# Patient Record
Sex: Female | Born: 1945 | Race: White | Hispanic: No | Smoking: Former smoker
Health system: Southern US, Community
[De-identification: ages and names within clinical notes are randomized; demographics above are authoritative.]

## PROBLEM LIST (undated history)

## (undated) DIAGNOSIS — Z860100 Personal history of colon polyps, unspecified: Secondary | ICD-10-CM

## (undated) DIAGNOSIS — I6529 Occlusion and stenosis of unspecified carotid artery: Secondary | ICD-10-CM

## (undated) DIAGNOSIS — L719 Rosacea, unspecified: Secondary | ICD-10-CM

## (undated) DIAGNOSIS — R51 Headache: Secondary | ICD-10-CM

## (undated) DIAGNOSIS — E785 Hyperlipidemia, unspecified: Secondary | ICD-10-CM

## (undated) DIAGNOSIS — E039 Hypothyroidism, unspecified: Secondary | ICD-10-CM

## (undated) DIAGNOSIS — F329 Major depressive disorder, single episode, unspecified: Secondary | ICD-10-CM

## (undated) DIAGNOSIS — R112 Nausea with vomiting, unspecified: Secondary | ICD-10-CM

## (undated) DIAGNOSIS — I10 Essential (primary) hypertension: Secondary | ICD-10-CM

## (undated) DIAGNOSIS — I679 Cerebrovascular disease, unspecified: Secondary | ICD-10-CM

## (undated) DIAGNOSIS — S82892A Other fracture of left lower leg, initial encounter for closed fracture: Secondary | ICD-10-CM

## (undated) DIAGNOSIS — G459 Transient cerebral ischemic attack, unspecified: Secondary | ICD-10-CM

## (undated) DIAGNOSIS — K219 Gastro-esophageal reflux disease without esophagitis: Secondary | ICD-10-CM

## (undated) DIAGNOSIS — D510 Vitamin B12 deficiency anemia due to intrinsic factor deficiency: Secondary | ICD-10-CM

## (undated) DIAGNOSIS — G8929 Other chronic pain: Secondary | ICD-10-CM

## (undated) DIAGNOSIS — M1990 Unspecified osteoarthritis, unspecified site: Secondary | ICD-10-CM

## (undated) DIAGNOSIS — M109 Gout, unspecified: Secondary | ICD-10-CM

## (undated) DIAGNOSIS — T8859XA Other complications of anesthesia, initial encounter: Secondary | ICD-10-CM

## (undated) DIAGNOSIS — Z9889 Other specified postprocedural states: Secondary | ICD-10-CM

## (undated) DIAGNOSIS — E119 Type 2 diabetes mellitus without complications: Secondary | ICD-10-CM

## (undated) DIAGNOSIS — M25511 Pain in right shoulder: Secondary | ICD-10-CM

## (undated) DIAGNOSIS — M545 Low back pain: Secondary | ICD-10-CM

## (undated) DIAGNOSIS — L600 Ingrowing nail: Secondary | ICD-10-CM

## (undated) DIAGNOSIS — Z8719 Personal history of other diseases of the digestive system: Secondary | ICD-10-CM

## (undated) DIAGNOSIS — F419 Anxiety disorder, unspecified: Secondary | ICD-10-CM

## (undated) DIAGNOSIS — I350 Nonrheumatic aortic (valve) stenosis: Secondary | ICD-10-CM

## (undated) DIAGNOSIS — K5732 Diverticulitis of large intestine without perforation or abscess without bleeding: Secondary | ICD-10-CM

## (undated) DIAGNOSIS — T4145XA Adverse effect of unspecified anesthetic, initial encounter: Secondary | ICD-10-CM

## (undated) DIAGNOSIS — R519 Headache, unspecified: Secondary | ICD-10-CM

## (undated) DIAGNOSIS — L03032 Cellulitis of left toe: Secondary | ICD-10-CM

## (undated) DIAGNOSIS — I6523 Occlusion and stenosis of bilateral carotid arteries: Secondary | ICD-10-CM

## (undated) DIAGNOSIS — G2581 Restless legs syndrome: Secondary | ICD-10-CM

## (undated) DIAGNOSIS — G609 Hereditary and idiopathic neuropathy, unspecified: Secondary | ICD-10-CM

## (undated) DIAGNOSIS — R002 Palpitations: Secondary | ICD-10-CM

## (undated) DIAGNOSIS — L03031 Cellulitis of right toe: Secondary | ICD-10-CM

## (undated) HISTORY — DX: Diverticulitis of large intestine without perforation or abscess without bleeding: K57.32

## (undated) HISTORY — DX: Palpitations: R00.2

## (undated) HISTORY — DX: Hereditary and idiopathic neuropathy, unspecified: G60.9

## (undated) HISTORY — DX: Unspecified osteoarthritis, unspecified site: M19.90

## (undated) HISTORY — DX: Gout, unspecified: M10.9

## (undated) HISTORY — DX: Personal history of colonic polyps: Z86.010

## (undated) HISTORY — DX: Low back pain: M54.5

## (undated) HISTORY — DX: Type 2 diabetes mellitus without complications: E11.9

## (undated) HISTORY — DX: Pain in right shoulder: M25.511

## (undated) HISTORY — DX: Major depressive disorder, single episode, unspecified: F32.9

## (undated) HISTORY — DX: Other chronic pain: G89.29

## (undated) HISTORY — DX: Other specified postprocedural states: Z98.890

## (undated) HISTORY — DX: Anxiety disorder, unspecified: F41.9

## (undated) HISTORY — DX: Essential (primary) hypertension: I10

## (undated) HISTORY — DX: Vitamin B12 deficiency anemia due to intrinsic factor deficiency: D51.0

## (undated) HISTORY — DX: Personal history of colon polyps, unspecified: Z86.0100

## (undated) HISTORY — DX: Occlusion and stenosis of bilateral carotid arteries: I65.23

## (undated) HISTORY — DX: Rosacea, unspecified: L71.9

## (undated) HISTORY — DX: Occlusion and stenosis of unspecified carotid artery: I65.29

## (undated) HISTORY — DX: Cellulitis of right toe: L03.031

## (undated) HISTORY — DX: Other fracture of left lower leg, initial encounter for closed fracture: S82.892A

## (undated) HISTORY — DX: Restless legs syndrome: G25.81

## (undated) HISTORY — PX: TOOTH EXTRACTION: SUR596

## (undated) HISTORY — DX: Nonrheumatic aortic (valve) stenosis: I35.0

## (undated) HISTORY — DX: Cerebrovascular disease, unspecified: I67.9

## (undated) HISTORY — PX: ANKLE SURGERY: SHX546

## (undated) HISTORY — DX: Hyperlipidemia, unspecified: E78.5

## (undated) HISTORY — DX: Hypothyroidism, unspecified: E03.9

## (undated) HISTORY — DX: Cellulitis of left toe: L03.032

## (undated) HISTORY — DX: Ingrowing nail: L60.0

## (undated) HISTORY — PX: APPENDECTOMY: SHX54

## (undated) HISTORY — PX: ABDOMINAL HYSTERECTOMY: SHX81

---

## 1973-07-16 HISTORY — PX: NASAL SINUS SURGERY: SHX719

## 1983-07-17 HISTORY — PX: POLYPECTOMY: SHX149

## 1989-07-16 HISTORY — PX: CHOLECYSTECTOMY: SHX55

## 2008-05-31 ENCOUNTER — Ambulatory Visit: Payer: Self-pay | Admitting: Cardiology

## 2010-11-28 NOTE — Assessment & Plan Note (Signed)
[facility]                            CARDIOLOGY OFFICE NOTE   Meredith Young                     MRN:          [id number]  DATE:05/31/2008                            DOB:          1946/06/12    PRIMARY CARE PHYSICIAN:  Meredith Fusi, MD.   REASON FOR REFERRAL:  Evaluate the patient with chest pain.   HISTORY OF PRESENT ILLNESS:  The patient is a pleasant 65 year old white  female.  I recently saw her mother as a new patient.  This patient has a  history of catheterization 2 years ago in [facility] for chest  discomfort.  She has told she had normal coronaries.  Two weeks ago she  started having increased burning chest discomfort with walking.  She has  had this same kind of pain in the past.  It had actually been going off  and on for a while, but seemed to increase in severity.  It would be a  substernal discomfort.  It might last for 15 minutes.  She described it  as severe.  She had some discomfort in both arms with this.  She was not  describing any shortness of breath or diaphoresis.  She does not have  any resting complaints.  She was not describing any PND or orthopnea.  She was not having any palpitations, presyncope, or syncope.  She went  to [facility].  She did have a Cardiolite demonstrating no  evidence of ischemia and a preserved ejection fraction.   The patient continues to get some of these symptoms with exertion though  perhaps not as severe.  She is referred here for further evaluation.   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   PAST SURGICAL HISTORY:  Hysterectomy and cholecystectomy.   ALLERGIES:  Intolerance to DEMEROL and CODEINE.   MEDICATIONS:  1. Paroxetine.  2. Glipizide 5 mg  q.a.m. and 5 mg q.p.m.  3. Insulin.  4. Onglyza 5 mg daily.  5. Lovaza 2000 mg b.i.d.  6. Folic acid.  7. Simvastatin 80 mg daily.  8. Coenzyme Q.  9. Gabapentin.  10.Minocycline.  11.Lisinopril 40 mg daily.  12.Amlodipine 5 mg daily.  13.Nexium p.r.n.   SOCIAL HISTORY:  The patient is retired.  She is a widow.  She has 2  children.  She smoked 4 packs per day for 22 years but quit in 1990.   FAMILY HISTORY:  Contributory for father dying of CVA at 41.  He had  hypertension.  Mother has a history of hypertension.  There is a  questionable myocardial infarction though I do not have documentation of  this.   REVIEW OF SYSTEMS:  As stated in the HPI and positive for headaches,  reflux, negative for all other systems.   PHYSICAL EXAMINATION:  GENERAL:  The patient is in no distress.  VITAL SIGNS:  Blood pressure 136/77, heart rate 90 and regular, body  mass index 32, and  weight 174 pounds.  HEENT:  Eyes are unremarkable, pupils equal, round, and reactive to  light.  Fundi not visualized.  Oral mucosa unremarkable.  NECK:  No jugular venous distention at 45 degrees, carotid upstroke  brisk and symmetrical.  No bruits, no thyromegaly.  LYMPHATICS:  No cervical, axillary, or inguinal adenopathy.  LUNGS:  Clear to auscultation bilaterally.  BACK:  No costovertebral angle tenderness.  CHEST:  Unremarkable.  HEART:  PMI not displaced or sustained, S1 and S2 within normal limits.  No S3, no S4, no clicks, no rubs, and no murmurs.  ABDOMEN:  Mildly  obese, positive bowel sounds, normal in frequency and pitch.  No bruits,  no rebound, no guarding, no midline pulsatile mass, no hepatomegaly, and  no splenomegaly.  SKIN:  No rashes, no nodules.  EXTREMITIES:  2+ pulses throughout, no edema, no cyanosis, and no  clubbing.  NEUROLOGIC:  Oriented to person, place, and time, cranial nerves II  through XII are grossly intact, motor grossly intact.   EKG sinus rhythm, rate 90, axis within  the rightward, intervals within  normal limits, poor anterior R-wave progression, no acute ST-T wave  changes.   ASSESSMENT AND PLAN:  1. Chest discomfort.  The patient has chest discomfort that was      worrisome.  However, she was seen at [facility] and had an      evaluation to include a stress perfusion study, which was negative      for any evidence of ischemia.  I will try to review these pictures.      Also, I will try get the results of the catheterization done a      couple of years ago at [place].  If indeed her catheterization was      normal then the possibility that this is a false negative stress      perfusion study is very low and no further testing would be      warranted.  She could be evaluated for nonanginal etiologies of her      chest discomfort.  She should continue with aggressive primary risk      reduction.  2. Peripheral vascular disease.  The patient can have carotids      followed by Dr. Tomasa Blase.  This should prompt aggressive lipid      lowering.  3.  Dyslipidemia per Dr. Tomasa Blase with the goal LDL less      than 70 and HDL greater than 50.  3. Diabetes per Dr. Tomasa Blase.  4. Hypertension.  Blood pressure is controlled on the meds as listed      and she will continue with this.  5. Followup.  I would like to follow her back if she has any      progressive symptoms or change in her symptoms.  I will be trying      to review these Cardiolite pictures, though I am not sure I can      open this disk.  I will also try to get the catheterization report      from [facility].     Rollene Rotunda, MD, [facility]  Electronically Signed    JH/MedQ  DD: 06/02/2008  DT: 06/03/2008  Job #: [id number]   cc:   Meredith Fusi, MD

## 2011-07-31 DIAGNOSIS — M545 Low back pain: Secondary | ICD-10-CM | POA: Diagnosis not present

## 2011-07-31 DIAGNOSIS — K5732 Diverticulitis of large intestine without perforation or abscess without bleeding: Secondary | ICD-10-CM | POA: Diagnosis not present

## 2011-08-31 DIAGNOSIS — E785 Hyperlipidemia, unspecified: Secondary | ICD-10-CM | POA: Diagnosis not present

## 2011-08-31 DIAGNOSIS — M109 Gout, unspecified: Secondary | ICD-10-CM | POA: Diagnosis not present

## 2011-08-31 DIAGNOSIS — Z6831 Body mass index (BMI) 31.0-31.9, adult: Secondary | ICD-10-CM | POA: Diagnosis not present

## 2011-08-31 DIAGNOSIS — I10 Essential (primary) hypertension: Secondary | ICD-10-CM | POA: Diagnosis not present

## 2011-08-31 DIAGNOSIS — J019 Acute sinusitis, unspecified: Secondary | ICD-10-CM | POA: Diagnosis not present

## 2011-08-31 DIAGNOSIS — E039 Hypothyroidism, unspecified: Secondary | ICD-10-CM | POA: Diagnosis not present

## 2011-08-31 DIAGNOSIS — J209 Acute bronchitis, unspecified: Secondary | ICD-10-CM | POA: Diagnosis not present

## 2011-08-31 DIAGNOSIS — D510 Vitamin B12 deficiency anemia due to intrinsic factor deficiency: Secondary | ICD-10-CM | POA: Diagnosis not present

## 2011-09-06 DIAGNOSIS — M109 Gout, unspecified: Secondary | ICD-10-CM | POA: Diagnosis not present

## 2011-09-28 DIAGNOSIS — Z6831 Body mass index (BMI) 31.0-31.9, adult: Secondary | ICD-10-CM | POA: Diagnosis not present

## 2011-09-28 DIAGNOSIS — F411 Generalized anxiety disorder: Secondary | ICD-10-CM | POA: Diagnosis not present

## 2011-09-30 DIAGNOSIS — N390 Urinary tract infection, site not specified: Secondary | ICD-10-CM | POA: Diagnosis not present

## 2011-09-30 DIAGNOSIS — R5381 Other malaise: Secondary | ICD-10-CM | POA: Diagnosis not present

## 2011-09-30 DIAGNOSIS — R5383 Other fatigue: Secondary | ICD-10-CM | POA: Diagnosis not present

## 2011-09-30 DIAGNOSIS — R509 Fever, unspecified: Secondary | ICD-10-CM | POA: Diagnosis not present

## 2011-09-30 DIAGNOSIS — R1110 Vomiting, unspecified: Secondary | ICD-10-CM | POA: Diagnosis not present

## 2011-10-05 DIAGNOSIS — N390 Urinary tract infection, site not specified: Secondary | ICD-10-CM | POA: Diagnosis not present

## 2011-10-05 DIAGNOSIS — Z4801 Encounter for change or removal of surgical wound dressing: Secondary | ICD-10-CM | POA: Diagnosis not present

## 2011-12-04 DIAGNOSIS — M109 Gout, unspecified: Secondary | ICD-10-CM | POA: Diagnosis not present

## 2011-12-04 DIAGNOSIS — IMO0001 Reserved for inherently not codable concepts without codable children: Secondary | ICD-10-CM | POA: Diagnosis not present

## 2011-12-04 DIAGNOSIS — N390 Urinary tract infection, site not specified: Secondary | ICD-10-CM | POA: Diagnosis not present

## 2011-12-04 DIAGNOSIS — E039 Hypothyroidism, unspecified: Secondary | ICD-10-CM | POA: Diagnosis not present

## 2011-12-04 DIAGNOSIS — D510 Vitamin B12 deficiency anemia due to intrinsic factor deficiency: Secondary | ICD-10-CM | POA: Diagnosis not present

## 2011-12-04 DIAGNOSIS — E785 Hyperlipidemia, unspecified: Secondary | ICD-10-CM | POA: Diagnosis not present

## 2011-12-04 DIAGNOSIS — I10 Essential (primary) hypertension: Secondary | ICD-10-CM | POA: Diagnosis not present

## 2011-12-04 DIAGNOSIS — Z6831 Body mass index (BMI) 31.0-31.9, adult: Secondary | ICD-10-CM | POA: Diagnosis not present

## 2011-12-21 DIAGNOSIS — H01009 Unspecified blepharitis unspecified eye, unspecified eyelid: Secondary | ICD-10-CM | POA: Diagnosis not present

## 2011-12-21 DIAGNOSIS — B309 Viral conjunctivitis, unspecified: Secondary | ICD-10-CM | POA: Diagnosis not present

## 2011-12-31 DIAGNOSIS — Z1231 Encounter for screening mammogram for malignant neoplasm of breast: Secondary | ICD-10-CM | POA: Diagnosis not present

## 2012-01-01 DIAGNOSIS — H01009 Unspecified blepharitis unspecified eye, unspecified eyelid: Secondary | ICD-10-CM | POA: Diagnosis not present

## 2012-01-01 DIAGNOSIS — B309 Viral conjunctivitis, unspecified: Secondary | ICD-10-CM | POA: Diagnosis not present

## 2012-01-18 DIAGNOSIS — J019 Acute sinusitis, unspecified: Secondary | ICD-10-CM | POA: Diagnosis not present

## 2012-01-18 DIAGNOSIS — M545 Low back pain: Secondary | ICD-10-CM | POA: Diagnosis not present

## 2012-01-18 DIAGNOSIS — H6690 Otitis media, unspecified, unspecified ear: Secondary | ICD-10-CM | POA: Diagnosis not present

## 2012-01-18 DIAGNOSIS — H6120 Impacted cerumen, unspecified ear: Secondary | ICD-10-CM | POA: Diagnosis not present

## 2012-01-18 DIAGNOSIS — R350 Frequency of micturition: Secondary | ICD-10-CM | POA: Diagnosis not present

## 2012-01-18 DIAGNOSIS — Z6832 Body mass index (BMI) 32.0-32.9, adult: Secondary | ICD-10-CM | POA: Diagnosis not present

## 2012-02-08 DIAGNOSIS — J209 Acute bronchitis, unspecified: Secondary | ICD-10-CM | POA: Diagnosis not present

## 2012-02-08 DIAGNOSIS — Z6831 Body mass index (BMI) 31.0-31.9, adult: Secondary | ICD-10-CM | POA: Diagnosis not present

## 2012-03-10 DIAGNOSIS — E785 Hyperlipidemia, unspecified: Secondary | ICD-10-CM | POA: Diagnosis not present

## 2012-03-10 DIAGNOSIS — IMO0001 Reserved for inherently not codable concepts without codable children: Secondary | ICD-10-CM | POA: Diagnosis not present

## 2012-03-10 DIAGNOSIS — Z6831 Body mass index (BMI) 31.0-31.9, adult: Secondary | ICD-10-CM | POA: Diagnosis not present

## 2012-03-10 DIAGNOSIS — I10 Essential (primary) hypertension: Secondary | ICD-10-CM | POA: Diagnosis not present

## 2012-03-10 DIAGNOSIS — D510 Vitamin B12 deficiency anemia due to intrinsic factor deficiency: Secondary | ICD-10-CM | POA: Diagnosis not present

## 2012-03-10 DIAGNOSIS — M109 Gout, unspecified: Secondary | ICD-10-CM | POA: Diagnosis not present

## 2012-03-10 DIAGNOSIS — R809 Proteinuria, unspecified: Secondary | ICD-10-CM | POA: Diagnosis not present

## 2012-03-10 DIAGNOSIS — E039 Hypothyroidism, unspecified: Secondary | ICD-10-CM | POA: Diagnosis not present

## 2012-03-13 DIAGNOSIS — R079 Chest pain, unspecified: Secondary | ICD-10-CM | POA: Diagnosis not present

## 2012-03-13 DIAGNOSIS — R05 Cough: Secondary | ICD-10-CM | POA: Diagnosis not present

## 2012-03-13 DIAGNOSIS — M47817 Spondylosis without myelopathy or radiculopathy, lumbosacral region: Secondary | ICD-10-CM | POA: Diagnosis not present

## 2012-03-13 DIAGNOSIS — R059 Cough, unspecified: Secondary | ICD-10-CM | POA: Diagnosis not present

## 2012-03-13 DIAGNOSIS — M545 Low back pain, unspecified: Secondary | ICD-10-CM | POA: Diagnosis not present

## 2012-05-07 DIAGNOSIS — N390 Urinary tract infection, site not specified: Secondary | ICD-10-CM | POA: Diagnosis not present

## 2012-05-07 DIAGNOSIS — Z23 Encounter for immunization: Secondary | ICD-10-CM | POA: Diagnosis not present

## 2012-06-11 DIAGNOSIS — D510 Vitamin B12 deficiency anemia due to intrinsic factor deficiency: Secondary | ICD-10-CM | POA: Diagnosis not present

## 2012-06-11 DIAGNOSIS — M109 Gout, unspecified: Secondary | ICD-10-CM | POA: Diagnosis not present

## 2012-06-11 DIAGNOSIS — Z6831 Body mass index (BMI) 31.0-31.9, adult: Secondary | ICD-10-CM | POA: Diagnosis not present

## 2012-06-11 DIAGNOSIS — E785 Hyperlipidemia, unspecified: Secondary | ICD-10-CM | POA: Diagnosis not present

## 2012-06-11 DIAGNOSIS — E039 Hypothyroidism, unspecified: Secondary | ICD-10-CM | POA: Diagnosis not present

## 2012-06-11 DIAGNOSIS — IMO0001 Reserved for inherently not codable concepts without codable children: Secondary | ICD-10-CM | POA: Diagnosis not present

## 2012-06-11 DIAGNOSIS — I10 Essential (primary) hypertension: Secondary | ICD-10-CM | POA: Diagnosis not present

## 2012-07-03 DIAGNOSIS — Z6831 Body mass index (BMI) 31.0-31.9, adult: Secondary | ICD-10-CM | POA: Diagnosis not present

## 2012-07-25 DIAGNOSIS — N3000 Acute cystitis without hematuria: Secondary | ICD-10-CM | POA: Diagnosis not present

## 2012-07-29 DIAGNOSIS — J019 Acute sinusitis, unspecified: Secondary | ICD-10-CM | POA: Diagnosis not present

## 2012-07-30 DIAGNOSIS — E119 Type 2 diabetes mellitus without complications: Secondary | ICD-10-CM | POA: Diagnosis not present

## 2012-07-30 DIAGNOSIS — N390 Urinary tract infection, site not specified: Secondary | ICD-10-CM | POA: Diagnosis not present

## 2012-07-30 DIAGNOSIS — R3915 Urgency of urination: Secondary | ICD-10-CM | POA: Diagnosis not present

## 2012-07-30 DIAGNOSIS — N3941 Urge incontinence: Secondary | ICD-10-CM | POA: Diagnosis not present

## 2012-07-30 DIAGNOSIS — N35919 Unspecified urethral stricture, male, unspecified site: Secondary | ICD-10-CM | POA: Diagnosis not present

## 2012-08-18 DIAGNOSIS — Z6832 Body mass index (BMI) 32.0-32.9, adult: Secondary | ICD-10-CM | POA: Diagnosis not present

## 2012-08-18 DIAGNOSIS — N390 Urinary tract infection, site not specified: Secondary | ICD-10-CM | POA: Diagnosis not present

## 2012-09-03 DIAGNOSIS — R3915 Urgency of urination: Secondary | ICD-10-CM | POA: Diagnosis not present

## 2012-09-03 DIAGNOSIS — N390 Urinary tract infection, site not specified: Secondary | ICD-10-CM | POA: Diagnosis not present

## 2012-09-03 DIAGNOSIS — N3941 Urge incontinence: Secondary | ICD-10-CM | POA: Diagnosis not present

## 2012-09-03 DIAGNOSIS — N289 Disorder of kidney and ureter, unspecified: Secondary | ICD-10-CM | POA: Diagnosis not present

## 2012-09-03 DIAGNOSIS — K5730 Diverticulosis of large intestine without perforation or abscess without bleeding: Secondary | ICD-10-CM | POA: Diagnosis not present

## 2012-09-03 DIAGNOSIS — K7689 Other specified diseases of liver: Secondary | ICD-10-CM | POA: Diagnosis not present

## 2012-10-20 DIAGNOSIS — J019 Acute sinusitis, unspecified: Secondary | ICD-10-CM | POA: Diagnosis not present

## 2012-11-04 DIAGNOSIS — E039 Hypothyroidism, unspecified: Secondary | ICD-10-CM | POA: Diagnosis not present

## 2012-11-04 DIAGNOSIS — M109 Gout, unspecified: Secondary | ICD-10-CM | POA: Diagnosis not present

## 2012-11-04 DIAGNOSIS — Z1331 Encounter for screening for depression: Secondary | ICD-10-CM | POA: Diagnosis not present

## 2012-11-04 DIAGNOSIS — E785 Hyperlipidemia, unspecified: Secondary | ICD-10-CM | POA: Diagnosis not present

## 2012-11-04 DIAGNOSIS — IMO0001 Reserved for inherently not codable concepts without codable children: Secondary | ICD-10-CM | POA: Diagnosis not present

## 2012-11-04 DIAGNOSIS — Z9181 History of falling: Secondary | ICD-10-CM | POA: Diagnosis not present

## 2012-11-04 DIAGNOSIS — I10 Essential (primary) hypertension: Secondary | ICD-10-CM | POA: Diagnosis not present

## 2012-11-04 DIAGNOSIS — D510 Vitamin B12 deficiency anemia due to intrinsic factor deficiency: Secondary | ICD-10-CM | POA: Diagnosis not present

## 2012-11-04 DIAGNOSIS — Z6833 Body mass index (BMI) 33.0-33.9, adult: Secondary | ICD-10-CM | POA: Diagnosis not present

## 2012-11-06 DIAGNOSIS — I6529 Occlusion and stenosis of unspecified carotid artery: Secondary | ICD-10-CM | POA: Diagnosis not present

## 2012-11-06 DIAGNOSIS — I6789 Other cerebrovascular disease: Secondary | ICD-10-CM | POA: Diagnosis not present

## 2012-11-06 DIAGNOSIS — I658 Occlusion and stenosis of other precerebral arteries: Secondary | ICD-10-CM | POA: Diagnosis not present

## 2012-11-10 ENCOUNTER — Encounter: Payer: Self-pay | Admitting: Vascular Surgery

## 2012-11-10 ENCOUNTER — Other Ambulatory Visit: Payer: Self-pay

## 2012-11-11 ENCOUNTER — Encounter: Payer: Self-pay | Admitting: Vascular Surgery

## 2012-11-24 ENCOUNTER — Encounter: Payer: Self-pay | Admitting: Vascular Surgery

## 2012-11-25 ENCOUNTER — Ambulatory Visit (INDEPENDENT_AMBULATORY_CARE_PROVIDER_SITE_OTHER): Payer: Medicare Other | Admitting: Vascular Surgery

## 2012-11-25 ENCOUNTER — Encounter: Payer: Self-pay | Admitting: Vascular Surgery

## 2012-11-25 ENCOUNTER — Other Ambulatory Visit (INDEPENDENT_AMBULATORY_CARE_PROVIDER_SITE_OTHER): Payer: Medicare Other

## 2012-11-25 DIAGNOSIS — I6529 Occlusion and stenosis of unspecified carotid artery: Secondary | ICD-10-CM | POA: Insufficient documentation

## 2012-11-25 HISTORY — DX: Occlusion and stenosis of unspecified carotid artery: I65.29

## 2012-11-25 NOTE — Progress Notes (Signed)
Subjective:     Patient ID: Meredith Young, female   DOB: Jun 15, 1946, 67 y.o.   MRN: [id number]  HPI this 67 year old female was referred for carotid occlusive disease. She had a recent episode where she awoke and seemed dizzy and quite strange with no specific motor or sensory loss. This prompted a carotid duplex study being performed which revealed moderate right ICA stenosis and a milder left ICA stenosis she was referred for further evaluation. She did have an episode in 2009 where she had some balance issues and MRI suggested that she had had a previous CVA. She currently has no neurologic symptoms such as lateralizing weakness,a phasia, amaurosis fugax, diplopia, blurred vision, or syncope.  Past Medical History  Diagnosis Date  . Carotid artery occlusion   . Anemia   . Hypothyroidism   . Diabetes mellitus without complication   . Hyperlipidemia   . Hypertension   . Gout   . Stroke   . Hx of colonic polyps   . Diverticulitis of colon   . Osteoarthritis   . Idiopathic peripheral neuropathy   . Gout     History  Substance Use Topics  . Smoking status: Former Smoker    Start date: 07/16/1988  . Smokeless tobacco: Not on file  . Alcohol Use: No    Family History  Problem Relation Age of Onset  . Diabetes Mother   . Hypertension Mother   . Stroke Father   . Diabetes Father   . Heart disease Father     before age 66  . Hyperlipidemia Father   . Hypertension Father   . Other Father     amputation of great toe  . Diabetes Sister     Allergies  Allergen Reactions  . Codeine   . Demerol (Meperidine)   . Diltiazem   . Metformin And Related   . Statins     Current outpatient prescriptions:allopurinol (ZYLOPRIM) 300 MG tablet, Take 300 mg by mouth daily., Disp: , Rfl: ;  amLODipine-valsartan (EXFORGE) 5-320 MG per tablet, Take 1 tablet by mouth daily., Disp: , Rfl: ;  aspirin 325 MG tablet, Take 325 mg by mouth daily., Disp: , Rfl: ;  folic acid (FOLVITE) 1 MG tablet,  Take 1 mg by mouth 2 (two) times daily. , Disp: , Rfl:  glipiZIDE (GLUCOTROL) 5 MG tablet, Take 5 mg by mouth 2 (two) times daily before a meal., Disp: , Rfl: ;  insulin glargine (LANTUS) 100 UNIT/ML injection, Inject 40 Units into the skin at bedtime. , Disp: , Rfl: ;  levothyroxine (SYNTHROID, LEVOTHROID) 100 MCG tablet, Take 100 mcg by mouth daily before breakfast., Disp: , Rfl: ;  Liraglutide (VICTOZA [place]), Inject into the skin., Disp: , Rfl:  lisinopril (PRINIVIL,ZESTRIL) 10 MG tablet, Take 10 mg by mouth daily., Disp: , Rfl: ;  Omega-3 Fatty Acids (FISH OIL) 1000 MG CAPS, Take 1 capsule by mouth 2 (two) times daily., Disp: , Rfl: ;  PARoxetine (PAXIL) 20 MG tablet, Take 20 mg by mouth every morning. Take 1/2 daily, Disp: , Rfl: ;  pioglitazone (ACTOS) 30 MG tablet, Take 30 mg by mouth daily., Disp: , Rfl: ;  Red Yeast Rice 600 MG TABS, Take 1 tablet by mouth daily., Disp: , Rfl:  colchicine 0.6 MG tablet, Take 0.6 mg by mouth daily., Disp: , Rfl: ;  cyanocobalamin (,VITAMIN B-12,) 1000 MCG/ML injection, Inject 1,000 mcg into the muscle every 30 (thirty) days., Disp: , Rfl: ;  dexlansoprazole (DEXILANT) 60 MG capsule,  Take 60 mg by mouth daily., Disp: , Rfl: ;  indomethacin (INDOCIN) 25 MG capsule, Take 25 mg by mouth 3 (three) times daily as needed., Disp: , Rfl:  Multiple Vitamins-Minerals (MULTIVITAMIN PO), Take by mouth., Disp: , Rfl:   BP 125/55  Pulse 77  Ht 5\' 3"  (1.6 m)  Wt 188 lb 9.6 oz (85.548 kg)  BMI 33.42 kg/m2  SpO2 100%  Body mass index is 33.42 kg/(m^2).           Review of Systems denies chest pain but does complain of dyspnea on exertion, productive cough, leg and hip discomfort with ambulation, and occasional dizziness.     Objective:   Physical Exam blood pressure 125/55 heart rate 77 respirations 18 Gen.-alert and oriented x3 in no apparent distress HEENT normal for age Lungs no rhonchi or wheezing Cardiovascular regular rhythm no murmurs carotid pulses 3+  palpable no bruits audible Abdomen soft nontender no palpable masses Musculoskeletal free of  major deformities Skin clear -no rashes Neurologic normal Lower extremities 3+ femoral and 2+ dorsalis pedis pulses palpable bilaterally with no edema  Today I ordered a carotid duplex exam which I reviewed and interpreted and compared to previous study performed at [facility] on 11/13/2012. Our study suggested the patient does have a 60-70% right ICA stenosis and a 40-50% left ICA stenosis.      Assessment:     Bilateral moderate ICA stenosis right greater than left with no specific neurologic symptoms currently    Plan:     Return in 9 months for carotid duplex exam followup unless she develops specific neurologic symptoms in the interim Continue daily aspirin

## 2012-11-26 NOTE — Addendum Note (Signed)
Addended by: Sharee Pimple on: 11/26/2012 10:23 AM   Modules accepted: Orders

## 2012-11-27 ENCOUNTER — Encounter: Payer: Self-pay | Admitting: Internal Medicine

## 2013-02-06 DIAGNOSIS — E039 Hypothyroidism, unspecified: Secondary | ICD-10-CM | POA: Diagnosis not present

## 2013-02-06 DIAGNOSIS — Z1231 Encounter for screening mammogram for malignant neoplasm of breast: Secondary | ICD-10-CM | POA: Diagnosis not present

## 2013-02-06 DIAGNOSIS — M109 Gout, unspecified: Secondary | ICD-10-CM | POA: Diagnosis not present

## 2013-02-06 DIAGNOSIS — I10 Essential (primary) hypertension: Secondary | ICD-10-CM | POA: Diagnosis not present

## 2013-02-06 DIAGNOSIS — E785 Hyperlipidemia, unspecified: Secondary | ICD-10-CM | POA: Diagnosis not present

## 2013-02-06 DIAGNOSIS — D510 Vitamin B12 deficiency anemia due to intrinsic factor deficiency: Secondary | ICD-10-CM | POA: Diagnosis not present

## 2013-02-06 DIAGNOSIS — N390 Urinary tract infection, site not specified: Secondary | ICD-10-CM | POA: Diagnosis not present

## 2013-02-06 DIAGNOSIS — IMO0001 Reserved for inherently not codable concepts without codable children: Secondary | ICD-10-CM | POA: Diagnosis not present

## 2013-03-18 DIAGNOSIS — H01009 Unspecified blepharitis unspecified eye, unspecified eyelid: Secondary | ICD-10-CM | POA: Diagnosis not present

## 2013-03-31 DIAGNOSIS — L719 Rosacea, unspecified: Secondary | ICD-10-CM | POA: Diagnosis not present

## 2013-03-31 DIAGNOSIS — L259 Unspecified contact dermatitis, unspecified cause: Secondary | ICD-10-CM | POA: Diagnosis not present

## 2013-03-31 DIAGNOSIS — L981 Factitial dermatitis: Secondary | ICD-10-CM | POA: Diagnosis not present

## 2013-04-03 DIAGNOSIS — Z6833 Body mass index (BMI) 33.0-33.9, adult: Secondary | ICD-10-CM | POA: Diagnosis not present

## 2013-04-03 DIAGNOSIS — E119 Type 2 diabetes mellitus without complications: Secondary | ICD-10-CM | POA: Diagnosis not present

## 2013-04-07 DIAGNOSIS — L719 Rosacea, unspecified: Secondary | ICD-10-CM | POA: Diagnosis not present

## 2013-04-07 DIAGNOSIS — L259 Unspecified contact dermatitis, unspecified cause: Secondary | ICD-10-CM | POA: Diagnosis not present

## 2013-05-05 DIAGNOSIS — L259 Unspecified contact dermatitis, unspecified cause: Secondary | ICD-10-CM | POA: Diagnosis not present

## 2013-05-05 DIAGNOSIS — L981 Factitial dermatitis: Secondary | ICD-10-CM | POA: Diagnosis not present

## 2013-05-05 DIAGNOSIS — L719 Rosacea, unspecified: Secondary | ICD-10-CM | POA: Diagnosis not present

## 2013-05-08 DIAGNOSIS — Z23 Encounter for immunization: Secondary | ICD-10-CM | POA: Diagnosis not present

## 2013-05-14 DIAGNOSIS — I10 Essential (primary) hypertension: Secondary | ICD-10-CM | POA: Diagnosis not present

## 2013-05-14 DIAGNOSIS — E785 Hyperlipidemia, unspecified: Secondary | ICD-10-CM | POA: Diagnosis not present

## 2013-05-14 DIAGNOSIS — D510 Vitamin B12 deficiency anemia due to intrinsic factor deficiency: Secondary | ICD-10-CM | POA: Diagnosis not present

## 2013-05-14 DIAGNOSIS — E039 Hypothyroidism, unspecified: Secondary | ICD-10-CM | POA: Diagnosis not present

## 2013-05-14 DIAGNOSIS — Z6833 Body mass index (BMI) 33.0-33.9, adult: Secondary | ICD-10-CM | POA: Diagnosis not present

## 2013-05-14 DIAGNOSIS — M109 Gout, unspecified: Secondary | ICD-10-CM | POA: Diagnosis not present

## 2013-05-19 DIAGNOSIS — H2510 Age-related nuclear cataract, unspecified eye: Secondary | ICD-10-CM | POA: Diagnosis not present

## 2013-05-19 DIAGNOSIS — H524 Presbyopia: Secondary | ICD-10-CM | POA: Diagnosis not present

## 2013-05-19 DIAGNOSIS — H547 Unspecified visual loss: Secondary | ICD-10-CM | POA: Diagnosis not present

## 2013-06-10 DIAGNOSIS — N390 Urinary tract infection, site not specified: Secondary | ICD-10-CM | POA: Diagnosis not present

## 2013-06-16 DIAGNOSIS — L820 Inflamed seborrheic keratosis: Secondary | ICD-10-CM | POA: Diagnosis not present

## 2013-06-16 DIAGNOSIS — L719 Rosacea, unspecified: Secondary | ICD-10-CM | POA: Diagnosis not present

## 2013-08-10 DIAGNOSIS — I10 Essential (primary) hypertension: Secondary | ICD-10-CM | POA: Diagnosis not present

## 2013-08-10 DIAGNOSIS — Z6835 Body mass index (BMI) 35.0-35.9, adult: Secondary | ICD-10-CM | POA: Diagnosis not present

## 2013-08-10 DIAGNOSIS — Z23 Encounter for immunization: Secondary | ICD-10-CM | POA: Diagnosis not present

## 2013-08-12 DIAGNOSIS — L219 Seborrheic dermatitis, unspecified: Secondary | ICD-10-CM | POA: Diagnosis not present

## 2013-08-12 DIAGNOSIS — K130 Diseases of lips: Secondary | ICD-10-CM | POA: Diagnosis not present

## 2013-08-12 DIAGNOSIS — L719 Rosacea, unspecified: Secondary | ICD-10-CM | POA: Diagnosis not present

## 2013-08-18 DIAGNOSIS — M109 Gout, unspecified: Secondary | ICD-10-CM | POA: Diagnosis not present

## 2013-08-18 DIAGNOSIS — E119 Type 2 diabetes mellitus without complications: Secondary | ICD-10-CM | POA: Diagnosis not present

## 2013-08-18 DIAGNOSIS — E785 Hyperlipidemia, unspecified: Secondary | ICD-10-CM | POA: Diagnosis not present

## 2013-08-18 DIAGNOSIS — E039 Hypothyroidism, unspecified: Secondary | ICD-10-CM | POA: Diagnosis not present

## 2013-08-18 DIAGNOSIS — I10 Essential (primary) hypertension: Secondary | ICD-10-CM | POA: Diagnosis not present

## 2013-08-18 DIAGNOSIS — D510 Vitamin B12 deficiency anemia due to intrinsic factor deficiency: Secondary | ICD-10-CM | POA: Diagnosis not present

## 2013-09-01 ENCOUNTER — Ambulatory Visit: Payer: Medicare Other | Admitting: Vascular Surgery

## 2013-09-01 ENCOUNTER — Other Ambulatory Visit (HOSPITAL_COMMUNITY): Payer: Medicare Other

## 2013-09-21 ENCOUNTER — Encounter: Payer: Self-pay | Admitting: Vascular Surgery

## 2013-09-22 ENCOUNTER — Encounter: Payer: Self-pay | Admitting: Family

## 2013-09-22 ENCOUNTER — Ambulatory Visit (INDEPENDENT_AMBULATORY_CARE_PROVIDER_SITE_OTHER): Payer: Medicare Other | Admitting: Family

## 2013-09-22 ENCOUNTER — Ambulatory Visit (HOSPITAL_COMMUNITY)
Admission: RE | Admit: 2013-09-22 | Discharge: 2013-09-22 | Disposition: A | Discharge status: Home / Self Care | Payer: Medicare Other | Source: Ambulatory Visit | Attending: Vascular Surgery | Admitting: Vascular Surgery

## 2013-09-22 VITALS — BP 132/78 | HR 94 | Resp 18 | Wt 190.0 lb

## 2013-09-22 DIAGNOSIS — I6529 Occlusion and stenosis of unspecified carotid artery: Secondary | ICD-10-CM | POA: Diagnosis not present

## 2013-09-22 NOTE — Addendum Note (Signed)
Addended by: Dorthula Rue L on: 09/22/2013 04:29 PM   Modules accepted: Orders

## 2013-09-22 NOTE — Patient Instructions (Addendum)
Stroke Prevention Some medical conditions and behaviors are associated with an increased chance of having a stroke. You may prevent a stroke by making healthy choices and managing medical conditions. HOW CAN I REDUCE MY RISK OF HAVING A STROKE?   Stay physically active. Get at least 30 minutes of activity on most or all days.  Do not smoke. It may also be helpful to avoid exposure to secondhand smoke.  Limit alcohol use. Moderate alcohol use is considered to be:  No more than 2 drinks per day for men.  No more than 1 drink per day for nonpregnant women.  Eat healthy foods. This involves  Eating 5 or more servings of fruits and vegetables a day.  Following a diet that addresses high blood pressure (hypertension), high cholesterol, diabetes, or obesity.  Manage your cholesterol levels.  A diet low in saturated fat, trans fat, and cholesterol and high in fiber may control cholesterol levels.  Take any prescribed medicines to control cholesterol as directed by your health care provider.  Manage your diabetes.  A controlled-carbohydrate, controlled-sugar diet is recommended to manage diabetes.  Take any prescribed medicines to control diabetes as directed by your health care provider.  Control your hypertension.  A low-salt (sodium), low-saturated fat, low-trans fat, and low-cholesterol diet is recommended to manage hypertension.  Take any prescribed medicines to control hypertension as directed by your health care provider.  Maintain a healthy weight.  A reduced-calorie, low-sodium, low-saturated fat, low-trans fat, low-cholesterol diet is recommended to manage weight.  Stop drug abuse.  Avoid taking birth control pills.  Talk to your health care provider about the risks of taking birth control pills if you are over 62 years old, smoke, get migraines, or have ever had a blood clot.  Get evaluated for sleep disorders (sleep apnea).  Talk to your health care provider about  getting a sleep evaluation if you snore a lot or have excessive sleepiness.  Take medicines as directed by your health care provider.  For some people, aspirin or blood thinners (anticoagulants) are helpful in reducing the risk of forming abnormal blood clots that can lead to stroke. If you have the irregular heart rhythm of atrial fibrillation, you should be on a blood thinner unless there is a good reason you cannot take them.  Understand all your medicine instructions.  Make sure that other other conditions (such as anemia or atherosclerosis) are addressed. SEEK IMMEDIATE MEDICAL CARE IF:   You have sudden weakness or numbness of the face, arm, or leg, especially on one side of the body.  Your face or eyelid droops to one side.  You have sudden confusion.  You have trouble speaking (aphasia) or understanding.  You have sudden trouble seeing in one or both eyes.  You have sudden trouble walking.  You have dizziness.  You have a loss of balance or coordination.  You have a sudden, severe headache with no known cause.  You have new chest pain or an irregular heartbeat. Any of these symptoms may represent a serious problem that is an emergency. Do not wait to see if the symptoms will go away. Get medical help at once. Call your local emergency services  (911 in [place] Do not drive yourself to the hospital. Document Released: 08/09/2004 Document Revised: 04/22/2013 Document Reviewed: 01/02/2013 [facility] Patient Information 2014 [place].    Can ask Dr. Delena Bali about Byetta since you cannot afford Victoza.  Urogynecologist, expert in helping with recurrent UTI's.

## 2013-09-22 NOTE — Progress Notes (Signed)
Established Carotid Patient   History of Present Illness  Meredith Young is a 68 y.o. female patient of Dr. Kellie Simmering. She ws initially referred by Dr. Delena Bali for carotid occlusive disease. She had an episode where she awoke and seemed dizzy and quite strange with no specific motor or sensory loss, in about 2008, had severe hypertension, evaluated in an ED. This prompted a carotid duplex study being performed which revealed moderate right ICA stenosis and a milder left ICA stenosis she was referred for further evaluation. She did have an episode in 2009 where she had some balance issues and MRI suggested that she had had a previous CVA. She currently has no neurologic symptoms such as lateralizing weakness,a phasia, amaurosis fugax, diplopia, blurred vision, or syncope. She has had no further dizziness issues since 2009.  Patient has not had previous carotid artery intervention. She has less balance issues lately. Walks about 5 minutes and both legs and both hips to calves throb. She has had a low back evaluation recently and was normal, had plain films, not a scan, per pt. She denies non healing wounds. Her low back hurts if she bends over and tries to lift, at times, low back is sore in the morning.  Patient has Negative history of TIA or stroke symptom.  The patient denies amaurosis fugax or monocular blindness.  The patient  denies facial drooping.  Pt. denies hemiplegia.  The patient denies receptive or expressive aphasia.  Pt. denies extremity weakness.   Patient denies New Medical or Surgical History.  Pt Diabetic: Yes, states last A1C was 7.? Pt smoker: former smoker, quit in 1990  Pt meds include: Statin : Yes ASA: Yes Other anticoagulants/antiplatelets: no   Past Medical History  Diagnosis Date  . Carotid artery occlusion   . Anemia   . Hypothyroidism   . Diabetes mellitus without complication   . Hyperlipidemia   . Hypertension   . Gout   . Stroke   . Hx of colonic  polyps   . Diverticulitis of colon   . Osteoarthritis   . Idiopathic peripheral neuropathy   . Gout     Social History History  Substance Use Topics  . Smoking status: Former Smoker    Start date: 07/16/1988  . Smokeless tobacco: Not on file  . Alcohol Use: No    Family History Family History  Problem Relation Age of Onset  . Diabetes Mother   . Hypertension Mother   . Stroke Father   . Diabetes Father   . Heart disease Father     before age 82  . Hyperlipidemia Father   . Hypertension Father   . Other Father     amputation of great toe  . Diabetes Sister     Surgical History Past Surgical History  Procedure Laterality Date  . Cholecystectomy  1991  . Polypectomy  1985  . Nasal sinus surgery  1975  . Appendectomy    . Abdominal hysterectomy      Allergies  Allergen Reactions  . Codeine   . Demerol [Meperidine]   . Diltiazem   . Metformin And Related   . Statins     Current Outpatient Prescriptions  Medication Sig Dispense Refill  . allopurinol (ZYLOPRIM) 300 MG tablet Take 300 mg by mouth daily.      Marland Kitchen amLODipine-valsartan (EXFORGE) 5-320 MG per tablet Take 1 tablet by mouth daily.      Marland Kitchen aspirin 325 MG tablet Take 325 mg by mouth daily.      Marland Kitchen  colchicine 0.6 MG tablet Take 0.6 mg by mouth daily.      . cyanocobalamin (,VITAMIN B-12,) 1000 MCG/ML injection Inject 1,000 mcg into the muscle every 30 (thirty) days.      Marland Kitchen dexlansoprazole (DEXILANT) 60 MG capsule Take 60 mg by mouth daily.      . folic acid (FOLVITE) 1 MG tablet Take 1 mg by mouth 2 (two) times daily.       Marland Kitchen glipiZIDE (GLUCOTROL) 5 MG tablet Take 5 mg by mouth 2 (two) times daily before a meal.      . indomethacin (INDOCIN) 25 MG capsule Take 25 mg by mouth 3 (three) times daily as needed.      . insulin glargine (LANTUS) 100 UNIT/ML injection Inject 40 Units into the skin at bedtime.       Marland Kitchen levothyroxine (SYNTHROID, LEVOTHROID) 100 MCG tablet Take 100 mcg by mouth daily before breakfast.       . Liraglutide (VICTOZA [place]) Inject into the skin.      Marland Kitchen lisinopril (PRINIVIL,ZESTRIL) 10 MG tablet Take 10 mg by mouth daily.      . Multiple Vitamins-Minerals (MULTIVITAMIN PO) Take by mouth.      . Omega-3 Fatty Acids (FISH OIL) 1000 MG CAPS Take 1 capsule by mouth 2 (two) times daily.      Marland Kitchen PARoxetine (PAXIL) 20 MG tablet Take 20 mg by mouth every morning. Take 1/2 daily      . pioglitazone (ACTOS) 30 MG tablet Take 30 mg by mouth daily.      . Red Yeast Rice 600 MG TABS Take 1 tablet by mouth daily.       No current facility-administered medications for this visit.    Review of Systems : See HPI for pertinent positives and negatives.  Physical Examination  Filed Vitals:   09/22/13 1407  BP: 132/78  Pulse: 94  Resp:    Filed Weights   09/22/13 1404  Weight: 190 lb (86.183 kg)   Body mass index is 33.67 kg/(m^2).   General: WDWN obese female in NAD GAIT: normal Eyes: PERRLA Pulmonary:  Non-labored, CTAB, Negative  Rales, Negative rhonchi, & Negative wheezing.  Cardiac: regular Rhythm ,  Negative detected murmur.  VASCULAR EXAM Carotid Bruits Left Right   Positive Positive     Radial pulses are 2+ palpable and equal.                                                                                                                            LE Pulses LEFT RIGHT       POPLITEAL  not palpable   not palpable       POSTERIOR TIBIAL   palpable    palpable        DORSALIS PEDIS      ANTERIOR TIBIAL  palpable   palpable     Gastrointestinal: soft, nontender, BS WNL, no r/g,  negative masses.  Musculoskeletal: Negative muscle atrophy/wasting. M/S  5/5 throughout, Extremities without ischemic changes.  Neurologic: A&O X 3; Appropriate Affect ; SENSATION ;normal;  Speech is normal CN 2-12 intact, Pain and light touch intact in extremities, Motor exam as listed above.   Non-Invasive Vascular Imaging CAROTID DUPLEX 09/22/2013   CEREBROVASCULAR DUPLEX EVALUATION     INDICATION: Carotid artery disease     PREVIOUS INTERVENTION(S):     DUPLEX EXAM:     RIGHT  LEFT  Peak Systolic Velocities (cm/s) End Diastolic Velocities (cm/s) Plaque LOCATION Peak Systolic Velocities (cm/s) End Diastolic Velocities (cm/s) Plaque  128 22  CCA PROXIMAL 117 30   99 23  CCA MID 107 28 HT  119 18 HT CCA DISTAL 96 20 HT  206 12 HT ECA 354 32 HT  131 41 HT ICA PROXIMAL 185 47 HT  185 62  ICA MID 220 46   140 35  ICA DISTAL 104 28     1.86 ICA / CCA Ratio (PSV) 2.05  Antegrade  Vertebral Flow Antegrade   939 Brachial Systolic Pressure (mmHg) 030  Triphasic  Brachial Artery Waveforms Biphasic     Plaque Morphology:  HM = Homogeneous, HT = Heterogeneous, CP = Calcific Plaque, SP = Smooth Plaque, IP = Irregular Plaque     ADDITIONAL FINDINGS:     IMPRESSION: Bilateral internal carotid artery velocities are suggestive of a 40-59% stenosis (high end of range). Left external carotid artery stenosis.    Compared to the previous exam:  Slightly lower velocities of the right ICA when compared to the last exam. No significant changes since exam on 11/25/2012.     Assessment: Meredith Young is a 68 y.o. female who presents with asymptomatic 40 - 59 % Bilateral ICA  Stenosis. The right ICA stenosis is  Improved from previous exam, but at the higher end of the range.  Plan: Follow-up in 6 months with Carotid Duplex scan. I discussed in depth with the patient the nature of atherosclerosis, and emphasized the importance of maximal medical management including strict control of blood pressure, blood glucose, and lipid levels, obtaining regular exercise, and continued cessation of smoking.  The patient is aware that without maximal medical management the underlying atherosclerotic disease process will progress, limiting the benefit of any interventions. The patient was given information about stroke prevention and what symptoms should prompt the patient to seek immediate  medical care. Thank you for allowing [place] to participate in this patient's care.  Clemon Chambers, RN, MSN, FNP-C [facility] Office: [phone]  Clinic Physician: Kellie Simmering  09/22/2013 2:10 PM

## 2013-09-23 DIAGNOSIS — Z6835 Body mass index (BMI) 35.0-35.9, adult: Secondary | ICD-10-CM | POA: Diagnosis not present

## 2013-09-23 DIAGNOSIS — M47817 Spondylosis without myelopathy or radiculopathy, lumbosacral region: Secondary | ICD-10-CM | POA: Diagnosis not present

## 2013-09-23 DIAGNOSIS — R3989 Other symptoms and signs involving the genitourinary system: Secondary | ICD-10-CM | POA: Diagnosis not present

## 2013-10-06 DIAGNOSIS — H10439 Chronic follicular conjunctivitis, unspecified eye: Secondary | ICD-10-CM | POA: Diagnosis not present

## 2013-10-06 DIAGNOSIS — H1030 Unspecified acute conjunctivitis, unspecified eye: Secondary | ICD-10-CM | POA: Diagnosis not present

## 2013-10-06 DIAGNOSIS — H01009 Unspecified blepharitis unspecified eye, unspecified eyelid: Secondary | ICD-10-CM | POA: Diagnosis not present

## 2013-10-08 DIAGNOSIS — L259 Unspecified contact dermatitis, unspecified cause: Secondary | ICD-10-CM | POA: Diagnosis not present

## 2013-10-08 DIAGNOSIS — L719 Rosacea, unspecified: Secondary | ICD-10-CM | POA: Diagnosis not present

## 2013-10-12 DIAGNOSIS — J209 Acute bronchitis, unspecified: Secondary | ICD-10-CM | POA: Diagnosis not present

## 2013-10-12 DIAGNOSIS — R6889 Other general symptoms and signs: Secondary | ICD-10-CM | POA: Diagnosis not present

## 2013-10-12 DIAGNOSIS — Z6834 Body mass index (BMI) 34.0-34.9, adult: Secondary | ICD-10-CM | POA: Diagnosis not present

## 2013-10-15 DIAGNOSIS — J40 Bronchitis, not specified as acute or chronic: Secondary | ICD-10-CM | POA: Diagnosis not present

## 2013-10-21 DIAGNOSIS — J209 Acute bronchitis, unspecified: Secondary | ICD-10-CM | POA: Diagnosis not present

## 2013-10-21 DIAGNOSIS — Z6833 Body mass index (BMI) 33.0-33.9, adult: Secondary | ICD-10-CM | POA: Diagnosis not present

## 2013-10-21 DIAGNOSIS — R300 Dysuria: Secondary | ICD-10-CM | POA: Diagnosis not present

## 2013-11-04 DIAGNOSIS — Z87898 Personal history of other specified conditions: Secondary | ICD-10-CM | POA: Diagnosis not present

## 2013-11-04 DIAGNOSIS — E785 Hyperlipidemia, unspecified: Secondary | ICD-10-CM | POA: Diagnosis not present

## 2013-11-04 DIAGNOSIS — I6529 Occlusion and stenosis of unspecified carotid artery: Secondary | ICD-10-CM | POA: Diagnosis not present

## 2013-11-04 DIAGNOSIS — E119 Type 2 diabetes mellitus without complications: Secondary | ICD-10-CM | POA: Diagnosis not present

## 2013-11-04 DIAGNOSIS — I10 Essential (primary) hypertension: Secondary | ICD-10-CM | POA: Diagnosis not present

## 2013-11-10 DIAGNOSIS — R0989 Other specified symptoms and signs involving the circulatory and respiratory systems: Secondary | ICD-10-CM | POA: Diagnosis not present

## 2013-11-19 DIAGNOSIS — E1149 Type 2 diabetes mellitus with other diabetic neurological complication: Secondary | ICD-10-CM | POA: Diagnosis not present

## 2013-11-19 DIAGNOSIS — E039 Hypothyroidism, unspecified: Secondary | ICD-10-CM | POA: Diagnosis not present

## 2013-11-19 DIAGNOSIS — E785 Hyperlipidemia, unspecified: Secondary | ICD-10-CM | POA: Diagnosis not present

## 2013-11-19 DIAGNOSIS — M109 Gout, unspecified: Secondary | ICD-10-CM | POA: Diagnosis not present

## 2013-11-19 DIAGNOSIS — Z9181 History of falling: Secondary | ICD-10-CM | POA: Diagnosis not present

## 2013-11-19 DIAGNOSIS — Z6833 Body mass index (BMI) 33.0-33.9, adult: Secondary | ICD-10-CM | POA: Diagnosis not present

## 2013-11-19 DIAGNOSIS — D510 Vitamin B12 deficiency anemia due to intrinsic factor deficiency: Secondary | ICD-10-CM | POA: Diagnosis not present

## 2013-11-19 DIAGNOSIS — IMO0001 Reserved for inherently not codable concepts without codable children: Secondary | ICD-10-CM | POA: Diagnosis not present

## 2013-12-29 DIAGNOSIS — H04129 Dry eye syndrome of unspecified lacrimal gland: Secondary | ICD-10-CM | POA: Diagnosis not present

## 2014-01-19 DIAGNOSIS — H04129 Dry eye syndrome of unspecified lacrimal gland: Secondary | ICD-10-CM | POA: Diagnosis not present

## 2014-01-28 DIAGNOSIS — X503XXA Overexertion from repetitive movements, initial encounter: Secondary | ICD-10-CM | POA: Diagnosis not present

## 2014-01-28 DIAGNOSIS — S82899A Other fracture of unspecified lower leg, initial encounter for closed fracture: Secondary | ICD-10-CM | POA: Diagnosis not present

## 2014-01-28 DIAGNOSIS — X500XXA Overexertion from strenuous movement or load, initial encounter: Secondary | ICD-10-CM | POA: Diagnosis not present

## 2014-02-02 DIAGNOSIS — S82892A Other fracture of left lower leg, initial encounter for closed fracture: Secondary | ICD-10-CM

## 2014-02-02 DIAGNOSIS — S82899A Other fracture of unspecified lower leg, initial encounter for closed fracture: Secondary | ICD-10-CM | POA: Diagnosis not present

## 2014-02-02 HISTORY — DX: Other fracture of left lower leg, initial encounter for closed fracture: S82.892A

## 2014-02-03 DIAGNOSIS — Y929 Unspecified place or not applicable: Secondary | ICD-10-CM | POA: Diagnosis not present

## 2014-02-03 DIAGNOSIS — Y939 Activity, unspecified: Secondary | ICD-10-CM | POA: Diagnosis not present

## 2014-02-03 DIAGNOSIS — G8918 Other acute postprocedural pain: Secondary | ICD-10-CM | POA: Diagnosis not present

## 2014-02-03 DIAGNOSIS — S8263XA Displaced fracture of lateral malleolus of unspecified fibula, initial encounter for closed fracture: Secondary | ICD-10-CM | POA: Diagnosis not present

## 2014-02-03 DIAGNOSIS — W19XXXA Unspecified fall, initial encounter: Secondary | ICD-10-CM | POA: Diagnosis not present

## 2014-02-03 DIAGNOSIS — Y999 Unspecified external cause status: Secondary | ICD-10-CM | POA: Diagnosis not present

## 2014-02-05 DIAGNOSIS — S82899A Other fracture of unspecified lower leg, initial encounter for closed fracture: Secondary | ICD-10-CM | POA: Diagnosis not present

## 2014-02-05 DIAGNOSIS — N390 Urinary tract infection, site not specified: Secondary | ICD-10-CM | POA: Diagnosis not present

## 2014-02-09 DIAGNOSIS — S82899A Other fracture of unspecified lower leg, initial encounter for closed fracture: Secondary | ICD-10-CM | POA: Diagnosis not present

## 2014-02-09 DIAGNOSIS — IMO0001 Reserved for inherently not codable concepts without codable children: Secondary | ICD-10-CM | POA: Diagnosis not present

## 2014-02-23 DIAGNOSIS — Z09 Encounter for follow-up examination after completed treatment for conditions other than malignant neoplasm: Secondary | ICD-10-CM | POA: Diagnosis not present

## 2014-02-23 DIAGNOSIS — S82899A Other fracture of unspecified lower leg, initial encounter for closed fracture: Secondary | ICD-10-CM | POA: Diagnosis not present

## 2014-03-03 DIAGNOSIS — Z6831 Body mass index (BMI) 31.0-31.9, adult: Secondary | ICD-10-CM | POA: Diagnosis not present

## 2014-03-03 DIAGNOSIS — N390 Urinary tract infection, site not specified: Secondary | ICD-10-CM | POA: Diagnosis not present

## 2014-03-03 DIAGNOSIS — E039 Hypothyroidism, unspecified: Secondary | ICD-10-CM | POA: Diagnosis not present

## 2014-03-03 DIAGNOSIS — I10 Essential (primary) hypertension: Secondary | ICD-10-CM | POA: Diagnosis not present

## 2014-03-03 DIAGNOSIS — D510 Vitamin B12 deficiency anemia due to intrinsic factor deficiency: Secondary | ICD-10-CM | POA: Diagnosis not present

## 2014-03-03 DIAGNOSIS — E785 Hyperlipidemia, unspecified: Secondary | ICD-10-CM | POA: Diagnosis not present

## 2014-03-03 DIAGNOSIS — E1149 Type 2 diabetes mellitus with other diabetic neurological complication: Secondary | ICD-10-CM | POA: Diagnosis not present

## 2014-03-03 DIAGNOSIS — M109 Gout, unspecified: Secondary | ICD-10-CM | POA: Diagnosis not present

## 2014-03-09 DIAGNOSIS — S8290XD Unspecified fracture of unspecified lower leg, subsequent encounter for closed fracture with routine healing: Secondary | ICD-10-CM | POA: Diagnosis not present

## 2014-03-09 DIAGNOSIS — S82899A Other fracture of unspecified lower leg, initial encounter for closed fracture: Secondary | ICD-10-CM | POA: Diagnosis not present

## 2014-03-10 DIAGNOSIS — M25579 Pain in unspecified ankle and joints of unspecified foot: Secondary | ICD-10-CM | POA: Diagnosis not present

## 2014-03-10 DIAGNOSIS — R269 Unspecified abnormalities of gait and mobility: Secondary | ICD-10-CM | POA: Diagnosis not present

## 2014-03-10 DIAGNOSIS — S82409A Unspecified fracture of shaft of unspecified fibula, initial encounter for closed fracture: Secondary | ICD-10-CM | POA: Diagnosis not present

## 2014-03-12 DIAGNOSIS — R269 Unspecified abnormalities of gait and mobility: Secondary | ICD-10-CM | POA: Diagnosis not present

## 2014-03-12 DIAGNOSIS — S82409A Unspecified fracture of shaft of unspecified fibula, initial encounter for closed fracture: Secondary | ICD-10-CM | POA: Diagnosis not present

## 2014-03-12 DIAGNOSIS — M25579 Pain in unspecified ankle and joints of unspecified foot: Secondary | ICD-10-CM | POA: Diagnosis not present

## 2014-03-15 DIAGNOSIS — R269 Unspecified abnormalities of gait and mobility: Secondary | ICD-10-CM | POA: Diagnosis not present

## 2014-03-15 DIAGNOSIS — M25579 Pain in unspecified ankle and joints of unspecified foot: Secondary | ICD-10-CM | POA: Diagnosis not present

## 2014-03-15 DIAGNOSIS — S82409A Unspecified fracture of shaft of unspecified fibula, initial encounter for closed fracture: Secondary | ICD-10-CM | POA: Diagnosis not present

## 2014-03-17 DIAGNOSIS — R269 Unspecified abnormalities of gait and mobility: Secondary | ICD-10-CM | POA: Diagnosis not present

## 2014-03-17 DIAGNOSIS — M25579 Pain in unspecified ankle and joints of unspecified foot: Secondary | ICD-10-CM | POA: Diagnosis not present

## 2014-03-17 DIAGNOSIS — S82409A Unspecified fracture of shaft of unspecified fibula, initial encounter for closed fracture: Secondary | ICD-10-CM | POA: Diagnosis not present

## 2014-03-23 DIAGNOSIS — E1169 Type 2 diabetes mellitus with other specified complication: Secondary | ICD-10-CM | POA: Diagnosis not present

## 2014-03-23 DIAGNOSIS — M25579 Pain in unspecified ankle and joints of unspecified foot: Secondary | ICD-10-CM | POA: Diagnosis not present

## 2014-03-23 DIAGNOSIS — S82409A Unspecified fracture of shaft of unspecified fibula, initial encounter for closed fracture: Secondary | ICD-10-CM | POA: Diagnosis not present

## 2014-03-23 DIAGNOSIS — E1149 Type 2 diabetes mellitus with other diabetic neurological complication: Secondary | ICD-10-CM | POA: Diagnosis not present

## 2014-03-23 DIAGNOSIS — R269 Unspecified abnormalities of gait and mobility: Secondary | ICD-10-CM | POA: Diagnosis not present

## 2014-03-29 DIAGNOSIS — S82409A Unspecified fracture of shaft of unspecified fibula, initial encounter for closed fracture: Secondary | ICD-10-CM | POA: Diagnosis not present

## 2014-03-29 DIAGNOSIS — M25579 Pain in unspecified ankle and joints of unspecified foot: Secondary | ICD-10-CM | POA: Diagnosis not present

## 2014-03-29 DIAGNOSIS — R269 Unspecified abnormalities of gait and mobility: Secondary | ICD-10-CM | POA: Diagnosis not present

## 2014-03-30 ENCOUNTER — Ambulatory Visit: Payer: Medicare Other | Admitting: Family

## 2014-03-30 ENCOUNTER — Other Ambulatory Visit (HOSPITAL_COMMUNITY): Payer: Medicare Other

## 2014-03-31 DIAGNOSIS — S82409A Unspecified fracture of shaft of unspecified fibula, initial encounter for closed fracture: Secondary | ICD-10-CM | POA: Diagnosis not present

## 2014-03-31 DIAGNOSIS — M25579 Pain in unspecified ankle and joints of unspecified foot: Secondary | ICD-10-CM | POA: Diagnosis not present

## 2014-03-31 DIAGNOSIS — R269 Unspecified abnormalities of gait and mobility: Secondary | ICD-10-CM | POA: Diagnosis not present

## 2014-04-02 DIAGNOSIS — M25579 Pain in unspecified ankle and joints of unspecified foot: Secondary | ICD-10-CM | POA: Diagnosis not present

## 2014-04-02 DIAGNOSIS — S82409A Unspecified fracture of shaft of unspecified fibula, initial encounter for closed fracture: Secondary | ICD-10-CM | POA: Diagnosis not present

## 2014-04-02 DIAGNOSIS — R269 Unspecified abnormalities of gait and mobility: Secondary | ICD-10-CM | POA: Diagnosis not present

## 2014-04-05 ENCOUNTER — Ambulatory Visit: Payer: Medicare Other | Admitting: Family

## 2014-04-05 ENCOUNTER — Other Ambulatory Visit (HOSPITAL_COMMUNITY): Payer: Medicare Other

## 2014-04-05 DIAGNOSIS — R269 Unspecified abnormalities of gait and mobility: Secondary | ICD-10-CM | POA: Diagnosis not present

## 2014-04-05 DIAGNOSIS — S82409A Unspecified fracture of shaft of unspecified fibula, initial encounter for closed fracture: Secondary | ICD-10-CM | POA: Diagnosis not present

## 2014-04-05 DIAGNOSIS — M25579 Pain in unspecified ankle and joints of unspecified foot: Secondary | ICD-10-CM | POA: Diagnosis not present

## 2014-04-09 DIAGNOSIS — Z13820 Encounter for screening for osteoporosis: Secondary | ICD-10-CM | POA: Diagnosis not present

## 2014-04-09 DIAGNOSIS — Z780 Asymptomatic menopausal state: Secondary | ICD-10-CM | POA: Diagnosis not present

## 2014-04-09 DIAGNOSIS — Z1231 Encounter for screening mammogram for malignant neoplasm of breast: Secondary | ICD-10-CM | POA: Diagnosis not present

## 2014-04-13 DIAGNOSIS — M25579 Pain in unspecified ankle and joints of unspecified foot: Secondary | ICD-10-CM | POA: Diagnosis not present

## 2014-04-13 DIAGNOSIS — S82409A Unspecified fracture of shaft of unspecified fibula, initial encounter for closed fracture: Secondary | ICD-10-CM | POA: Diagnosis not present

## 2014-04-13 DIAGNOSIS — R269 Unspecified abnormalities of gait and mobility: Secondary | ICD-10-CM | POA: Diagnosis not present

## 2014-04-15 ENCOUNTER — Encounter: Payer: Self-pay | Admitting: Internal Medicine

## 2014-04-16 DIAGNOSIS — R2689 Other abnormalities of gait and mobility: Secondary | ICD-10-CM | POA: Diagnosis not present

## 2014-04-16 DIAGNOSIS — S82401A Unspecified fracture of shaft of right fibula, initial encounter for closed fracture: Secondary | ICD-10-CM | POA: Diagnosis not present

## 2014-04-16 DIAGNOSIS — M62572 Muscle wasting and atrophy, not elsewhere classified, left ankle and foot: Secondary | ICD-10-CM | POA: Diagnosis not present

## 2014-04-16 DIAGNOSIS — M25572 Pain in left ankle and joints of left foot: Secondary | ICD-10-CM | POA: Diagnosis not present

## 2014-04-19 DIAGNOSIS — R2689 Other abnormalities of gait and mobility: Secondary | ICD-10-CM | POA: Diagnosis not present

## 2014-04-19 DIAGNOSIS — S82401A Unspecified fracture of shaft of right fibula, initial encounter for closed fracture: Secondary | ICD-10-CM | POA: Diagnosis not present

## 2014-04-19 DIAGNOSIS — M62572 Muscle wasting and atrophy, not elsewhere classified, left ankle and foot: Secondary | ICD-10-CM | POA: Diagnosis not present

## 2014-04-19 DIAGNOSIS — M25572 Pain in left ankle and joints of left foot: Secondary | ICD-10-CM | POA: Diagnosis not present

## 2014-04-22 DIAGNOSIS — S82401A Unspecified fracture of shaft of right fibula, initial encounter for closed fracture: Secondary | ICD-10-CM | POA: Diagnosis not present

## 2014-04-22 DIAGNOSIS — R2689 Other abnormalities of gait and mobility: Secondary | ICD-10-CM | POA: Diagnosis not present

## 2014-04-22 DIAGNOSIS — M25572 Pain in left ankle and joints of left foot: Secondary | ICD-10-CM | POA: Diagnosis not present

## 2014-04-22 DIAGNOSIS — M62572 Muscle wasting and atrophy, not elsewhere classified, left ankle and foot: Secondary | ICD-10-CM | POA: Diagnosis not present

## 2014-04-27 ENCOUNTER — Encounter: Payer: Self-pay | Admitting: Endocrinology

## 2014-04-27 ENCOUNTER — Ambulatory Visit (INDEPENDENT_AMBULATORY_CARE_PROVIDER_SITE_OTHER): Payer: Medicare Other | Admitting: Endocrinology

## 2014-04-27 VITALS — BP 138/70 | HR 98 | Temp 98.3°F | Ht 63.0 in | Wt 181.0 lb

## 2014-04-27 DIAGNOSIS — E1142 Type 2 diabetes mellitus with diabetic polyneuropathy: Secondary | ICD-10-CM

## 2014-04-27 DIAGNOSIS — L719 Rosacea, unspecified: Secondary | ICD-10-CM

## 2014-04-27 DIAGNOSIS — M545 Low back pain: Secondary | ICD-10-CM

## 2014-04-27 DIAGNOSIS — Z860100 Personal history of colon polyps, unspecified: Secondary | ICD-10-CM

## 2014-04-27 DIAGNOSIS — D510 Vitamin B12 deficiency anemia due to intrinsic factor deficiency: Secondary | ICD-10-CM

## 2014-04-27 DIAGNOSIS — F419 Anxiety disorder, unspecified: Secondary | ICD-10-CM

## 2014-04-27 DIAGNOSIS — M1990 Unspecified osteoarthritis, unspecified site: Secondary | ICD-10-CM | POA: Diagnosis not present

## 2014-04-27 DIAGNOSIS — I679 Cerebrovascular disease, unspecified: Secondary | ICD-10-CM | POA: Diagnosis not present

## 2014-04-27 DIAGNOSIS — F329 Major depressive disorder, single episode, unspecified: Secondary | ICD-10-CM

## 2014-04-27 DIAGNOSIS — I10 Essential (primary) hypertension: Secondary | ICD-10-CM

## 2014-04-27 DIAGNOSIS — F32A Depression, unspecified: Secondary | ICD-10-CM

## 2014-04-27 DIAGNOSIS — K219 Gastro-esophageal reflux disease without esophagitis: Secondary | ICD-10-CM

## 2014-04-27 DIAGNOSIS — E039 Hypothyroidism, unspecified: Secondary | ICD-10-CM

## 2014-04-27 DIAGNOSIS — I6529 Occlusion and stenosis of unspecified carotid artery: Secondary | ICD-10-CM | POA: Diagnosis not present

## 2014-04-27 DIAGNOSIS — G2581 Restless legs syndrome: Secondary | ICD-10-CM

## 2014-04-27 MED ORDER — INSULIN GLARGINE 100 UNIT/ML ~~LOC~~ SOLN: 70.0000 [IU] | SUBCUTANEOUS | Status: DC

## 2014-04-27 NOTE — Patient Instructions (Addendum)
good diet and exercise habits significanly improve the control of your diabetes.  please let me know if you wish to be referred to a dietician.  high blood sugar is very risky to your health.  you should see an eye doctor and dentist every year.  It is very important to get all recommended vaccinations.  controlling your blood pressure and cholesterol drastically reduces the damage diabetes does to your body.  this also applies to quitting smoking.  please discuss these with your doctor.  check your blood sugar 4 times a day: before the 3 meals, and at bedtime.  also check if you have symptoms of your blood sugar being too high or too low.  please keep a record of the readings and bring it to your next appointment here.  You can write it on any piece of paper.  please call us sooner if your blood sugar goes below 70, or if you have a lot of readings over 200. Please stay-off the actos and glipizide, and: Increase the lantus to 70 units each morning.  Take just 50 if you are going to be active.  Please come back for a follow-up appointment in 2 weeks.

## 2014-04-27 NOTE — Progress Notes (Signed)
Subjective:    Patient ID: Meredith Young, female    DOB: 21-Mar-1946, 68 y.o.   MRN: [id number]  HPI pt states DM was dx'ed in 2007; she has moderate neuropathy of the lower extremities; she has associated retinopathy and PAD; he has been on insulin since 2010; pt says her diet and exercise are not good; she has never had GDM, pancreatitis or DKA.   She has had several episodes of hypoglycemia--most recently in early 2015.   She has mild hypoglycemia whenever she is active.  However, since she stopped the glipizide, cbg's have been in the 200's.   Past Medical History  Diagnosis Date  . Carotid artery occlusion   . Anemia   . Hypothyroidism   . Diabetes mellitus without complication   . Hyperlipidemia   . Hypertension   . Gout   . Stroke   . Hx of colonic polyps   . Diverticulitis of colon   . Osteoarthritis   . Idiopathic peripheral neuropathy   . Gout     Past Surgical History  Procedure Laterality Date  . Cholecystectomy  1991  . Polypectomy  1985  . Nasal sinus surgery  1975  . Appendectomy    . Abdominal hysterectomy      History   Social History  . Marital Status: Single    Spouse Name: N/A    Number of Children: N/A  . Years of Education: N/A   Occupational History  . Not on file.   Social History Main Topics  . Smoking status: Former Smoker    Start date: 07/16/1988  . Smokeless tobacco: Not on file  . Alcohol Use: No  . Drug Use: No  . Sexual Activity: Not on file   Other Topics Concern  . Not on file   Social History Narrative  . No narrative on file    Current Outpatient Prescriptions on File Prior to Visit  Medication Sig Dispense Refill  . allopurinol (ZYLOPRIM) 300 MG tablet Take 300 mg by mouth daily.      Marland Kitchen amLODipine-valsartan (EXFORGE) 5-320 MG per tablet Take 1 tablet by mouth daily.      Marland Kitchen aspirin 325 MG tablet Take 325 mg by mouth daily.      . folic acid (FOLVITE) 1 MG tablet Take 1 mg by mouth 2 (two) times daily.       Marland Kitchen  levothyroxine (SYNTHROID, LEVOTHROID) 100 MCG tablet Take 100 mcg by mouth daily before breakfast.      . Omega-3 Fatty Acids (FISH OIL) 1000 MG CAPS Take 1 capsule by mouth 2 (two) times daily.      Marland Kitchen PARoxetine (PAXIL) 20 MG tablet Take 20 mg by mouth every morning. Take 1/2 daily      . Red Yeast Rice 600 MG TABS Take 1 tablet by mouth daily.       No current facility-administered medications on file prior to visit.    Allergies  Allergen Reactions  . Codeine   . Demerol [Meperidine]   . Diltiazem   . Metformin And Related   . Statins     Family History  Problem Relation Age of Onset  . Hypertension Mother   . Stroke Father   . Diabetes Father   . Heart disease Father     before age 87  . Hyperlipidemia Father   . Hypertension Father   . Other Father     amputation of great toe  . Diabetes Sister  BP 138/70  Pulse 98  Temp(Src) 98.3 F (36.8 C) (Oral)  Ht 5\' 3"  (1.6 m)  Wt 181 lb (82.101 kg)  BMI 32.07 kg/m2  SpO2 97%  Review of Systems denies blurry vision, headache, chest pain, sob, n/v, muscle cramps, excessive diaphoresis, memory loss, cold intolerance, and easy bruising.  She has weight gain, rhinorrhea, and urinary frequency.  Depression is well-controlled.     Objective:   Physical Exam VS: see vs page GEN: no distress HEAD: head: no deformity eyes: no periorbital swelling, no proptosis external nose and ears are normal mouth: no lesion seen NECK: supple, thyroid is not enlarged CHEST WALL: no deformity LUNGS:  Clear to auscultation.   CV: reg rate and rhythm, no murmur ABD: abdomen is soft, nontender.  no hepatosplenomegaly.  not distended.  no hernia MUSCULOSKELETAL: muscle bulk and strength are grossly normal.  no obvious joint swelling.  gait is normal and steady EXTEMITIES: no deformity.  no ulcer on the feet.  feet are of normal color and temp.  no edema.  There is bilateral onychomycosis and varicosities.  PULSES: dorsalis pedis intact  bilat.  no carotid bruit NEURO:  cn 2-12 grossly intact.   readily moves all 4's.  sensation is intact to touch on the feet, but decreased from normal. SKIN:  Normal texture and temperature.  No rash or suspicious lesion is visible.   NODES:  None palpable at the neck PSYCH: alert, well-oriented.  Does not appear anxious nor depressed.   i have reviewed the following old records: Office notes  outside test results are reviewed: A1c=7.3     Assessment & Plan:  DM: new to me.  Based on the pattern of her cbg's, she needs some adjustment in her therapy Multiple daily injections would be best, but she wants to continue qd insulin for now.  H/o severe hypoglycemia, due to insulin.  She'll need to check cbg's qid, at least for now.  Patient is advised the following: Patient Instructions  good diet and exercise habits significanly improve the control of your diabetes.  please let me know if you wish to be referred to a dietician.  high blood sugar is very risky to your health.  you should see an eye doctor and dentist every year.  It is very important to get all recommended vaccinations.  controlling your blood pressure and cholesterol drastically reduces the damage diabetes does to your body.  this also applies to quitting smoking.  please discuss these with your doctor.  check your blood sugar 4 times a day: before the 3 meals, and at bedtime.  also check if you have symptoms of your blood sugar being too high or too low.  please keep a record of the readings and bring it to your next appointment here.  You can write it on any piece of paper.  please call us sooner if your blood sugar goes below 70, or if you have a lot of readings over 200. Please stay-off the actos and glipizide, and: Increase the lantus to 70 units each morning.  Take just 50 if you are going to be active.  Please come back for a follow-up appointment in 2 weeks.

## 2014-04-28 DIAGNOSIS — D510 Vitamin B12 deficiency anemia due to intrinsic factor deficiency: Secondary | ICD-10-CM

## 2014-04-28 DIAGNOSIS — I10 Essential (primary) hypertension: Secondary | ICD-10-CM

## 2014-04-28 DIAGNOSIS — G2581 Restless legs syndrome: Secondary | ICD-10-CM | POA: Insufficient documentation

## 2014-04-28 DIAGNOSIS — Z860100 Personal history of colon polyps, unspecified: Secondary | ICD-10-CM

## 2014-04-28 DIAGNOSIS — F32A Depression, unspecified: Secondary | ICD-10-CM

## 2014-04-28 DIAGNOSIS — E039 Hypothyroidism, unspecified: Secondary | ICD-10-CM | POA: Insufficient documentation

## 2014-04-28 DIAGNOSIS — F419 Anxiety disorder, unspecified: Secondary | ICD-10-CM | POA: Insufficient documentation

## 2014-04-28 DIAGNOSIS — E119 Type 2 diabetes mellitus without complications: Secondary | ICD-10-CM | POA: Insufficient documentation

## 2014-04-28 DIAGNOSIS — F329 Major depressive disorder, single episode, unspecified: Secondary | ICD-10-CM | POA: Insufficient documentation

## 2014-04-28 DIAGNOSIS — I679 Cerebrovascular disease, unspecified: Secondary | ICD-10-CM | POA: Insufficient documentation

## 2014-04-28 DIAGNOSIS — M545 Low back pain, unspecified: Secondary | ICD-10-CM

## 2014-04-28 DIAGNOSIS — L719 Rosacea, unspecified: Secondary | ICD-10-CM

## 2014-04-28 DIAGNOSIS — M1990 Unspecified osteoarthritis, unspecified site: Secondary | ICD-10-CM | POA: Insufficient documentation

## 2014-04-28 DIAGNOSIS — K219 Gastro-esophageal reflux disease without esophagitis: Secondary | ICD-10-CM | POA: Insufficient documentation

## 2014-04-28 HISTORY — DX: Vitamin B12 deficiency anemia due to intrinsic factor deficiency: D51.0

## 2014-04-28 HISTORY — DX: Restless legs syndrome: G25.81

## 2014-04-28 HISTORY — DX: Personal history of colon polyps, unspecified: Z86.0100

## 2014-04-28 HISTORY — DX: Personal history of colonic polyps: Z86.010

## 2014-04-28 HISTORY — DX: Depression, unspecified: F32.A

## 2014-04-28 HISTORY — DX: Essential (primary) hypertension: I10

## 2014-04-28 HISTORY — DX: Unspecified osteoarthritis, unspecified site: M19.90

## 2014-04-28 HISTORY — DX: Cerebrovascular disease, unspecified: I67.9

## 2014-04-28 HISTORY — DX: Anxiety disorder, unspecified: F41.9

## 2014-04-28 HISTORY — DX: Rosacea, unspecified: L71.9

## 2014-04-28 HISTORY — DX: Type 2 diabetes mellitus without complications: E11.9

## 2014-04-28 HISTORY — DX: Low back pain, unspecified: M54.50

## 2014-04-29 DIAGNOSIS — S82401A Unspecified fracture of shaft of right fibula, initial encounter for closed fracture: Secondary | ICD-10-CM | POA: Diagnosis not present

## 2014-04-29 DIAGNOSIS — M25572 Pain in left ankle and joints of left foot: Secondary | ICD-10-CM | POA: Diagnosis not present

## 2014-04-29 DIAGNOSIS — R2689 Other abnormalities of gait and mobility: Secondary | ICD-10-CM | POA: Diagnosis not present

## 2014-04-29 DIAGNOSIS — M62572 Muscle wasting and atrophy, not elsewhere classified, left ankle and foot: Secondary | ICD-10-CM | POA: Diagnosis not present

## 2014-05-11 ENCOUNTER — Ambulatory Visit (INDEPENDENT_AMBULATORY_CARE_PROVIDER_SITE_OTHER): Payer: Medicare Other | Admitting: Endocrinology

## 2014-05-11 ENCOUNTER — Encounter: Payer: Self-pay | Admitting: Endocrinology

## 2014-05-11 VITALS — BP 138/60 | HR 98 | Temp 98.5°F | Ht 63.0 in | Wt 180.0 lb

## 2014-05-11 DIAGNOSIS — E1142 Type 2 diabetes mellitus with diabetic polyneuropathy: Secondary | ICD-10-CM

## 2014-05-11 DIAGNOSIS — I6529 Occlusion and stenosis of unspecified carotid artery: Secondary | ICD-10-CM

## 2014-05-11 NOTE — Patient Instructions (Addendum)
check your blood sugar 4 times a day: before the 3 meals, and at bedtime.  also check if you have symptoms of your blood sugar being too high or too low.  please keep a record of the readings and bring it to your next appointment here.  You can write it on any piece of paper.  please call us sooner if your blood sugar goes below 70, or if you have a lot of readings over 200.  Increase the lantus to 70 units each morning.  Take just 50 if you are going to be active.   On this type of insulin schedule, you should eat meals on a regular schedule.  If a meal is missed or significantly delayed, your blood sugar could go low.  Please come back for a follow-up appointment in 6 weeks.

## 2014-05-11 NOTE — Progress Notes (Signed)
Subjective:    Patient ID: Meredith Young, female    DOB: 1946-01-30, 68 y.o.   MRN: [id number]  HPI Pt returns for f/u of diabetes mellitus: DM type: Insulin-requiring type 2 Dx'ed: 4098 Complications: polyneuropathy, retinopathy, and PAD Therapy: insulin since 2010 GDM: never DKA: never Severe hypoglycemia: most recently in early 2015 Pancreatitis: never Other: pt chooses qd insulin. Interval history: since last ov, she has had 2 episodes of mild hypoglycemia.  These happened when a meal was missed.  no cbg record, but states cbg's are otherwise 170-200. Past Medical History  Diagnosis Date  . Carotid artery occlusion   . Anemia   . Hypothyroidism   . Diabetes mellitus without complication   . Hyperlipidemia   . Hypertension   . Gout   . Stroke   . Hx of colonic polyps   . Diverticulitis of colon   . Osteoarthritis   . Idiopathic peripheral neuropathy   . Gout     Past Surgical History  Procedure Laterality Date  . Cholecystectomy  1991  . Polypectomy  1985  . Nasal sinus surgery  1975  . Appendectomy    . Abdominal hysterectomy      History   Social History  . Marital Status: Single    Spouse Name: N/A    Number of Children: N/A  . Years of Education: N/A   Occupational History  . Not on file.   Social History Main Topics  . Smoking status: Former Smoker    Start date: 07/16/1988  . Smokeless tobacco: Not on file  . Alcohol Use: No  . Drug Use: No  . Sexual Activity: Not on file   Other Topics Concern  . Not on file   Social History Narrative  . No narrative on file    Current Outpatient Prescriptions on File Prior to Visit  Medication Sig Dispense Refill  . allopurinol (ZYLOPRIM) 300 MG tablet Take 300 mg by mouth daily.      Marland Kitchen amLODipine-valsartan (EXFORGE) 5-320 MG per tablet Take 1 tablet by mouth daily.      Marland Kitchen aspirin 325 MG tablet Take 325 mg by mouth daily.      . folic acid (FOLVITE) 1 MG tablet Take 1 mg by mouth 2 (two) times  daily.       . insulin glargine (LANTUS) 100 UNIT/ML injection Inject 0.7 mLs (70 Units total) into the skin every morning. And syringes 2/day  30 mL  11  . levothyroxine (SYNTHROID, LEVOTHROID) 100 MCG tablet Take 100 mcg by mouth daily before breakfast.      . Omega-3 Fatty Acids (FISH OIL) 1000 MG CAPS Take 1 capsule by mouth 2 (two) times daily.      Marland Kitchen PARoxetine (PAXIL) 20 MG tablet Take 20 mg by mouth every morning. Take 1/2 daily      . Red Yeast Rice 600 MG TABS Take 1 tablet by mouth daily.       No current facility-administered medications on file prior to visit.    Allergies  Allergen Reactions  . Codeine   . Demerol [Meperidine]   . Diltiazem   . Metformin And Related   . Statins     Family History  Problem Relation Age of Onset  . Hypertension Mother   . Stroke Father   . Diabetes Father   . Heart disease Father     before age 55  . Hyperlipidemia Father   . Hypertension Father   . Other Father  amputation of great toe  . Diabetes Sister     BP 138/60  Pulse 98  Temp(Src) 98.5 F (36.9 C) (Oral)  Ht 5\' 3"  (1.6 m)  Wt 180 lb (81.647 kg)  BMI 31.89 kg/m2  SpO2 98%   Review of Systems Denies LOC and n/v    Objective:   Physical Exam VITAL SIGNS:  See vs page GENERAL: no distress SKIN:  Insulin injection sites at the anterior abdomen are normal     Assessment & Plan:  DM: moderate exacerbation.  We discussed rx options.  Pt chooses to continue qd insulin.  Patient is advised the following: Patient Instructions  check your blood sugar 4 times a day: before the 3 meals, and at bedtime.  also check if you have symptoms of your blood sugar being too high or too low.  please keep a record of the readings and bring it to your next appointment here.  You can write it on any piece of paper.  please call us sooner if your blood sugar goes below 70, or if you have a lot of readings over 200.  Increase the lantus to 70 units each morning.  Take just 50 if  you are going to be active.   On this type of insulin schedule, you should eat meals on a regular schedule.  If a meal is missed or significantly delayed, your blood sugar could go low.  Please come back for a follow-up appointment in 6 weeks.

## 2014-05-13 DIAGNOSIS — Z23 Encounter for immunization: Secondary | ICD-10-CM | POA: Diagnosis not present

## 2014-05-13 DIAGNOSIS — E1140 Type 2 diabetes mellitus with diabetic neuropathy, unspecified: Secondary | ICD-10-CM | POA: Diagnosis not present

## 2014-05-13 DIAGNOSIS — Z6831 Body mass index (BMI) 31.0-31.9, adult: Secondary | ICD-10-CM | POA: Diagnosis not present

## 2014-05-19 DIAGNOSIS — I6523 Occlusion and stenosis of bilateral carotid arteries: Secondary | ICD-10-CM | POA: Diagnosis not present

## 2014-05-19 DIAGNOSIS — R000 Tachycardia, unspecified: Secondary | ICD-10-CM | POA: Diagnosis not present

## 2014-05-19 DIAGNOSIS — I10 Essential (primary) hypertension: Secondary | ICD-10-CM | POA: Diagnosis not present

## 2014-05-19 DIAGNOSIS — E785 Hyperlipidemia, unspecified: Secondary | ICD-10-CM | POA: Diagnosis not present

## 2014-05-20 DIAGNOSIS — E119 Type 2 diabetes mellitus without complications: Secondary | ICD-10-CM | POA: Diagnosis not present

## 2014-05-20 DIAGNOSIS — H524 Presbyopia: Secondary | ICD-10-CM | POA: Diagnosis not present

## 2014-05-20 DIAGNOSIS — H2513 Age-related nuclear cataract, bilateral: Secondary | ICD-10-CM | POA: Diagnosis not present

## 2014-05-24 DIAGNOSIS — R000 Tachycardia, unspecified: Secondary | ICD-10-CM | POA: Diagnosis not present

## 2014-05-24 DIAGNOSIS — E785 Hyperlipidemia, unspecified: Secondary | ICD-10-CM | POA: Diagnosis not present

## 2014-05-26 DIAGNOSIS — Z6832 Body mass index (BMI) 32.0-32.9, adult: Secondary | ICD-10-CM | POA: Diagnosis not present

## 2014-05-26 DIAGNOSIS — E1140 Type 2 diabetes mellitus with diabetic neuropathy, unspecified: Secondary | ICD-10-CM | POA: Diagnosis not present

## 2014-06-02 DIAGNOSIS — I6529 Occlusion and stenosis of unspecified carotid artery: Secondary | ICD-10-CM | POA: Diagnosis not present

## 2014-06-03 DIAGNOSIS — R0602 Shortness of breath: Secondary | ICD-10-CM | POA: Diagnosis not present

## 2014-06-03 DIAGNOSIS — I10 Essential (primary) hypertension: Secondary | ICD-10-CM | POA: Diagnosis not present

## 2014-06-03 DIAGNOSIS — R000 Tachycardia, unspecified: Secondary | ICD-10-CM | POA: Diagnosis not present

## 2014-06-04 DIAGNOSIS — E1140 Type 2 diabetes mellitus with diabetic neuropathy, unspecified: Secondary | ICD-10-CM | POA: Diagnosis not present

## 2014-06-04 DIAGNOSIS — E785 Hyperlipidemia, unspecified: Secondary | ICD-10-CM | POA: Diagnosis not present

## 2014-06-04 DIAGNOSIS — M109 Gout, unspecified: Secondary | ICD-10-CM | POA: Diagnosis not present

## 2014-06-04 DIAGNOSIS — I10 Essential (primary) hypertension: Secondary | ICD-10-CM | POA: Diagnosis not present

## 2014-06-04 DIAGNOSIS — D510 Vitamin B12 deficiency anemia due to intrinsic factor deficiency: Secondary | ICD-10-CM | POA: Diagnosis not present

## 2014-06-04 DIAGNOSIS — Z6831 Body mass index (BMI) 31.0-31.9, adult: Secondary | ICD-10-CM | POA: Diagnosis not present

## 2014-06-04 DIAGNOSIS — E039 Hypothyroidism, unspecified: Secondary | ICD-10-CM | POA: Diagnosis not present

## 2014-06-17 DIAGNOSIS — J209 Acute bronchitis, unspecified: Secondary | ICD-10-CM | POA: Diagnosis not present

## 2014-06-17 DIAGNOSIS — Z6831 Body mass index (BMI) 31.0-31.9, adult: Secondary | ICD-10-CM | POA: Diagnosis not present

## 2014-06-17 DIAGNOSIS — R300 Dysuria: Secondary | ICD-10-CM | POA: Diagnosis not present

## 2014-06-22 ENCOUNTER — Ambulatory Visit: Payer: Medicare Other | Admitting: Endocrinology

## 2014-09-14 DIAGNOSIS — D510 Vitamin B12 deficiency anemia due to intrinsic factor deficiency: Secondary | ICD-10-CM | POA: Diagnosis not present

## 2014-09-14 DIAGNOSIS — E785 Hyperlipidemia, unspecified: Secondary | ICD-10-CM | POA: Diagnosis not present

## 2014-09-14 DIAGNOSIS — E039 Hypothyroidism, unspecified: Secondary | ICD-10-CM | POA: Diagnosis not present

## 2014-09-14 DIAGNOSIS — I10 Essential (primary) hypertension: Secondary | ICD-10-CM | POA: Diagnosis not present

## 2014-09-14 DIAGNOSIS — E1140 Type 2 diabetes mellitus with diabetic neuropathy, unspecified: Secondary | ICD-10-CM | POA: Diagnosis not present

## 2014-09-14 DIAGNOSIS — M109 Gout, unspecified: Secondary | ICD-10-CM | POA: Diagnosis not present

## 2014-11-30 DIAGNOSIS — Z6830 Body mass index (BMI) 30.0-30.9, adult: Secondary | ICD-10-CM | POA: Diagnosis not present

## 2014-11-30 DIAGNOSIS — J019 Acute sinusitis, unspecified: Secondary | ICD-10-CM | POA: Diagnosis not present

## 2014-11-30 DIAGNOSIS — E1140 Type 2 diabetes mellitus with diabetic neuropathy, unspecified: Secondary | ICD-10-CM | POA: Diagnosis not present

## 2014-12-22 DIAGNOSIS — I10 Essential (primary) hypertension: Secondary | ICD-10-CM | POA: Diagnosis not present

## 2014-12-22 DIAGNOSIS — Z6829 Body mass index (BMI) 29.0-29.9, adult: Secondary | ICD-10-CM | POA: Diagnosis not present

## 2014-12-22 DIAGNOSIS — E1140 Type 2 diabetes mellitus with diabetic neuropathy, unspecified: Secondary | ICD-10-CM | POA: Diagnosis not present

## 2014-12-22 DIAGNOSIS — M109 Gout, unspecified: Secondary | ICD-10-CM | POA: Diagnosis not present

## 2014-12-22 DIAGNOSIS — E785 Hyperlipidemia, unspecified: Secondary | ICD-10-CM | POA: Diagnosis not present

## 2014-12-22 DIAGNOSIS — E039 Hypothyroidism, unspecified: Secondary | ICD-10-CM | POA: Diagnosis not present

## 2014-12-22 DIAGNOSIS — Z9181 History of falling: Secondary | ICD-10-CM | POA: Diagnosis not present

## 2014-12-22 DIAGNOSIS — M545 Low back pain: Secondary | ICD-10-CM | POA: Diagnosis not present

## 2015-02-10 DIAGNOSIS — R0982 Postnasal drip: Secondary | ICD-10-CM | POA: Diagnosis not present

## 2015-02-10 DIAGNOSIS — E1140 Type 2 diabetes mellitus with diabetic neuropathy, unspecified: Secondary | ICD-10-CM | POA: Diagnosis not present

## 2015-02-10 DIAGNOSIS — J019 Acute sinusitis, unspecified: Secondary | ICD-10-CM | POA: Diagnosis not present

## 2015-02-10 DIAGNOSIS — Z6830 Body mass index (BMI) 30.0-30.9, adult: Secondary | ICD-10-CM | POA: Diagnosis not present

## 2015-02-24 DIAGNOSIS — E785 Hyperlipidemia, unspecified: Secondary | ICD-10-CM | POA: Insufficient documentation

## 2015-02-24 DIAGNOSIS — I350 Nonrheumatic aortic (valve) stenosis: Secondary | ICD-10-CM

## 2015-02-24 HISTORY — DX: Nonrheumatic aortic (valve) stenosis: I35.0

## 2015-03-17 DIAGNOSIS — Q253 Supravalvular aortic stenosis: Secondary | ICD-10-CM | POA: Diagnosis not present

## 2015-03-17 DIAGNOSIS — E785 Hyperlipidemia, unspecified: Secondary | ICD-10-CM | POA: Diagnosis not present

## 2015-03-17 DIAGNOSIS — R002 Palpitations: Secondary | ICD-10-CM | POA: Diagnosis not present

## 2015-03-17 DIAGNOSIS — I10 Essential (primary) hypertension: Secondary | ICD-10-CM | POA: Diagnosis not present

## 2015-03-17 DIAGNOSIS — I6523 Occlusion and stenosis of bilateral carotid arteries: Secondary | ICD-10-CM | POA: Diagnosis not present

## 2015-03-28 DIAGNOSIS — R002 Palpitations: Secondary | ICD-10-CM | POA: Diagnosis not present

## 2015-03-28 DIAGNOSIS — Z111 Encounter for screening for respiratory tuberculosis: Secondary | ICD-10-CM | POA: Diagnosis not present

## 2015-03-28 DIAGNOSIS — R55 Syncope and collapse: Secondary | ICD-10-CM | POA: Diagnosis not present

## 2015-03-28 DIAGNOSIS — E785 Hyperlipidemia, unspecified: Secondary | ICD-10-CM | POA: Diagnosis not present

## 2015-04-07 DIAGNOSIS — Z6830 Body mass index (BMI) 30.0-30.9, adult: Secondary | ICD-10-CM | POA: Diagnosis not present

## 2015-04-07 DIAGNOSIS — E1140 Type 2 diabetes mellitus with diabetic neuropathy, unspecified: Secondary | ICD-10-CM | POA: Diagnosis not present

## 2015-04-07 DIAGNOSIS — E039 Hypothyroidism, unspecified: Secondary | ICD-10-CM | POA: Diagnosis not present

## 2015-04-07 DIAGNOSIS — D510 Vitamin B12 deficiency anemia due to intrinsic factor deficiency: Secondary | ICD-10-CM | POA: Diagnosis not present

## 2015-04-07 DIAGNOSIS — E785 Hyperlipidemia, unspecified: Secondary | ICD-10-CM | POA: Diagnosis not present

## 2015-04-07 DIAGNOSIS — I10 Essential (primary) hypertension: Secondary | ICD-10-CM | POA: Diagnosis not present

## 2015-04-07 DIAGNOSIS — M109 Gout, unspecified: Secondary | ICD-10-CM | POA: Diagnosis not present

## 2015-04-07 DIAGNOSIS — Z1231 Encounter for screening mammogram for malignant neoplasm of breast: Secondary | ICD-10-CM | POA: Diagnosis not present

## 2015-05-23 DIAGNOSIS — Z23 Encounter for immunization: Secondary | ICD-10-CM | POA: Diagnosis not present

## 2015-05-26 DIAGNOSIS — H2513 Age-related nuclear cataract, bilateral: Secondary | ICD-10-CM | POA: Diagnosis not present

## 2015-05-26 DIAGNOSIS — H524 Presbyopia: Secondary | ICD-10-CM | POA: Diagnosis not present

## 2015-05-26 DIAGNOSIS — E119 Type 2 diabetes mellitus without complications: Secondary | ICD-10-CM | POA: Diagnosis not present

## 2015-05-26 DIAGNOSIS — H04123 Dry eye syndrome of bilateral lacrimal glands: Secondary | ICD-10-CM | POA: Diagnosis not present

## 2015-06-03 DIAGNOSIS — E669 Obesity, unspecified: Secondary | ICD-10-CM | POA: Diagnosis not present

## 2015-06-03 DIAGNOSIS — R0982 Postnasal drip: Secondary | ICD-10-CM | POA: Diagnosis not present

## 2015-06-03 DIAGNOSIS — Z6830 Body mass index (BMI) 30.0-30.9, adult: Secondary | ICD-10-CM | POA: Diagnosis not present

## 2015-06-03 DIAGNOSIS — J019 Acute sinusitis, unspecified: Secondary | ICD-10-CM | POA: Diagnosis not present

## 2015-08-09 DIAGNOSIS — E1140 Type 2 diabetes mellitus with diabetic neuropathy, unspecified: Secondary | ICD-10-CM | POA: Diagnosis not present

## 2015-08-09 DIAGNOSIS — Z6829 Body mass index (BMI) 29.0-29.9, adult: Secondary | ICD-10-CM | POA: Diagnosis not present

## 2015-08-09 DIAGNOSIS — J0111 Acute recurrent frontal sinusitis: Secondary | ICD-10-CM | POA: Diagnosis not present

## 2015-08-09 DIAGNOSIS — J019 Acute sinusitis, unspecified: Secondary | ICD-10-CM | POA: Diagnosis not present

## 2015-08-12 DIAGNOSIS — J321 Chronic frontal sinusitis: Secondary | ICD-10-CM | POA: Diagnosis not present

## 2015-08-12 DIAGNOSIS — Z79899 Other long term (current) drug therapy: Secondary | ICD-10-CM | POA: Diagnosis not present

## 2015-08-12 DIAGNOSIS — J019 Acute sinusitis, unspecified: Secondary | ICD-10-CM | POA: Diagnosis not present

## 2015-08-12 DIAGNOSIS — Z6830 Body mass index (BMI) 30.0-30.9, adult: Secondary | ICD-10-CM | POA: Diagnosis not present

## 2015-08-16 DIAGNOSIS — J321 Chronic frontal sinusitis: Secondary | ICD-10-CM | POA: Diagnosis not present

## 2015-08-16 DIAGNOSIS — J320 Chronic maxillary sinusitis: Secondary | ICD-10-CM | POA: Diagnosis not present

## 2015-08-16 DIAGNOSIS — J328 Other chronic sinusitis: Secondary | ICD-10-CM | POA: Diagnosis not present

## 2015-08-17 DIAGNOSIS — J329 Chronic sinusitis, unspecified: Secondary | ICD-10-CM | POA: Diagnosis not present

## 2015-08-17 DIAGNOSIS — R51 Headache: Secondary | ICD-10-CM | POA: Diagnosis not present

## 2015-09-15 DIAGNOSIS — K219 Gastro-esophageal reflux disease without esophagitis: Secondary | ICD-10-CM | POA: Diagnosis not present

## 2015-09-15 DIAGNOSIS — R0789 Other chest pain: Secondary | ICD-10-CM | POA: Diagnosis not present

## 2015-09-15 DIAGNOSIS — K5792 Diverticulitis of intestine, part unspecified, without perforation or abscess without bleeding: Secondary | ICD-10-CM | POA: Diagnosis not present

## 2015-09-15 DIAGNOSIS — Z6831 Body mass index (BMI) 31.0-31.9, adult: Secondary | ICD-10-CM | POA: Diagnosis not present

## 2015-09-29 DIAGNOSIS — Z6830 Body mass index (BMI) 30.0-30.9, adult: Secondary | ICD-10-CM | POA: Diagnosis not present

## 2015-09-29 DIAGNOSIS — D519 Vitamin B12 deficiency anemia, unspecified: Secondary | ICD-10-CM | POA: Diagnosis not present

## 2015-09-29 DIAGNOSIS — I10 Essential (primary) hypertension: Secondary | ICD-10-CM | POA: Diagnosis not present

## 2015-09-29 DIAGNOSIS — E785 Hyperlipidemia, unspecified: Secondary | ICD-10-CM | POA: Diagnosis not present

## 2015-09-29 DIAGNOSIS — E1140 Type 2 diabetes mellitus with diabetic neuropathy, unspecified: Secondary | ICD-10-CM | POA: Diagnosis not present

## 2015-09-29 DIAGNOSIS — M109 Gout, unspecified: Secondary | ICD-10-CM | POA: Diagnosis not present

## 2015-09-29 DIAGNOSIS — E039 Hypothyroidism, unspecified: Secondary | ICD-10-CM | POA: Diagnosis not present

## 2015-11-14 DIAGNOSIS — E1140 Type 2 diabetes mellitus with diabetic neuropathy, unspecified: Secondary | ICD-10-CM | POA: Diagnosis not present

## 2015-11-14 DIAGNOSIS — I10 Essential (primary) hypertension: Secondary | ICD-10-CM | POA: Diagnosis not present

## 2015-11-14 DIAGNOSIS — Z6831 Body mass index (BMI) 31.0-31.9, adult: Secondary | ICD-10-CM | POA: Diagnosis not present

## 2015-11-28 DIAGNOSIS — Z6830 Body mass index (BMI) 30.0-30.9, adult: Secondary | ICD-10-CM | POA: Diagnosis not present

## 2015-11-28 DIAGNOSIS — J208 Acute bronchitis due to other specified organisms: Secondary | ICD-10-CM | POA: Diagnosis not present

## 2016-01-02 DIAGNOSIS — H1013 Acute atopic conjunctivitis, bilateral: Secondary | ICD-10-CM | POA: Diagnosis not present

## 2016-01-03 DIAGNOSIS — M109 Gout, unspecified: Secondary | ICD-10-CM | POA: Diagnosis not present

## 2016-01-03 DIAGNOSIS — I10 Essential (primary) hypertension: Secondary | ICD-10-CM | POA: Diagnosis not present

## 2016-01-03 DIAGNOSIS — E039 Hypothyroidism, unspecified: Secondary | ICD-10-CM | POA: Diagnosis not present

## 2016-01-03 DIAGNOSIS — Z6829 Body mass index (BMI) 29.0-29.9, adult: Secondary | ICD-10-CM | POA: Diagnosis not present

## 2016-01-03 DIAGNOSIS — E785 Hyperlipidemia, unspecified: Secondary | ICD-10-CM | POA: Diagnosis not present

## 2016-01-03 DIAGNOSIS — E1140 Type 2 diabetes mellitus with diabetic neuropathy, unspecified: Secondary | ICD-10-CM | POA: Diagnosis not present

## 2016-01-03 DIAGNOSIS — D519 Vitamin B12 deficiency anemia, unspecified: Secondary | ICD-10-CM | POA: Diagnosis not present

## 2016-01-19 DIAGNOSIS — S86812A Strain of other muscle(s) and tendon(s) at lower leg level, left leg, initial encounter: Secondary | ICD-10-CM | POA: Diagnosis not present

## 2016-01-19 DIAGNOSIS — M79605 Pain in left leg: Secondary | ICD-10-CM | POA: Diagnosis not present

## 2016-01-20 DIAGNOSIS — I6789 Other cerebrovascular disease: Secondary | ICD-10-CM | POA: Diagnosis not present

## 2016-01-20 DIAGNOSIS — R51 Headache: Secondary | ICD-10-CM | POA: Diagnosis not present

## 2016-01-20 DIAGNOSIS — R531 Weakness: Secondary | ICD-10-CM | POA: Diagnosis not present

## 2016-01-20 DIAGNOSIS — Z7982 Long term (current) use of aspirin: Secondary | ICD-10-CM | POA: Diagnosis not present

## 2016-01-20 DIAGNOSIS — G451 Carotid artery syndrome (hemispheric): Secondary | ICD-10-CM | POA: Diagnosis not present

## 2016-01-20 DIAGNOSIS — R200 Anesthesia of skin: Secondary | ICD-10-CM | POA: Diagnosis not present

## 2016-01-23 DIAGNOSIS — R002 Palpitations: Secondary | ICD-10-CM | POA: Diagnosis not present

## 2016-01-23 DIAGNOSIS — I6523 Occlusion and stenosis of bilateral carotid arteries: Secondary | ICD-10-CM | POA: Diagnosis not present

## 2016-01-23 DIAGNOSIS — Z6830 Body mass index (BMI) 30.0-30.9, adult: Secondary | ICD-10-CM | POA: Diagnosis not present

## 2016-01-23 DIAGNOSIS — E785 Hyperlipidemia, unspecified: Secondary | ICD-10-CM | POA: Diagnosis not present

## 2016-01-23 DIAGNOSIS — I10 Essential (primary) hypertension: Secondary | ICD-10-CM | POA: Diagnosis not present

## 2016-01-23 DIAGNOSIS — Z79899 Other long term (current) drug therapy: Secondary | ICD-10-CM | POA: Diagnosis not present

## 2016-01-23 DIAGNOSIS — G459 Transient cerebral ischemic attack, unspecified: Secondary | ICD-10-CM | POA: Diagnosis not present

## 2016-01-27 DIAGNOSIS — R002 Palpitations: Secondary | ICD-10-CM

## 2016-01-27 HISTORY — DX: Palpitations: R00.2

## 2016-01-30 DIAGNOSIS — R55 Syncope and collapse: Secondary | ICD-10-CM | POA: Diagnosis not present

## 2016-01-30 DIAGNOSIS — I350 Nonrheumatic aortic (valve) stenosis: Secondary | ICD-10-CM | POA: Diagnosis not present

## 2016-01-30 DIAGNOSIS — R002 Palpitations: Secondary | ICD-10-CM | POA: Diagnosis not present

## 2016-01-30 DIAGNOSIS — I6523 Occlusion and stenosis of bilateral carotid arteries: Secondary | ICD-10-CM | POA: Diagnosis not present

## 2016-01-31 DIAGNOSIS — I6523 Occlusion and stenosis of bilateral carotid arteries: Secondary | ICD-10-CM | POA: Diagnosis not present

## 2016-01-31 DIAGNOSIS — E785 Hyperlipidemia, unspecified: Secondary | ICD-10-CM | POA: Diagnosis not present

## 2016-01-31 DIAGNOSIS — E1140 Type 2 diabetes mellitus with diabetic neuropathy, unspecified: Secondary | ICD-10-CM | POA: Diagnosis not present

## 2016-01-31 DIAGNOSIS — Z6829 Body mass index (BMI) 29.0-29.9, adult: Secondary | ICD-10-CM | POA: Diagnosis not present

## 2016-01-31 DIAGNOSIS — G459 Transient cerebral ischemic attack, unspecified: Secondary | ICD-10-CM | POA: Diagnosis not present

## 2016-01-31 DIAGNOSIS — I10 Essential (primary) hypertension: Secondary | ICD-10-CM | POA: Diagnosis not present

## 2016-02-01 DIAGNOSIS — R002 Palpitations: Secondary | ICD-10-CM | POA: Diagnosis not present

## 2016-02-06 ENCOUNTER — Other Ambulatory Visit: Payer: Self-pay | Admitting: Vascular Surgery

## 2016-02-06 DIAGNOSIS — I6521 Occlusion and stenosis of right carotid artery: Secondary | ICD-10-CM

## 2016-02-06 DIAGNOSIS — R55 Syncope and collapse: Secondary | ICD-10-CM | POA: Diagnosis not present

## 2016-02-06 DIAGNOSIS — R002 Palpitations: Secondary | ICD-10-CM | POA: Diagnosis not present

## 2016-02-10 ENCOUNTER — Encounter: Payer: Self-pay | Admitting: Vascular Surgery

## 2016-02-15 ENCOUNTER — Ambulatory Visit (HOSPITAL_COMMUNITY)
Admission: RE | Admit: 2016-02-15 | Discharge: 2016-02-15 | Disposition: A | Discharge status: Home / Self Care | Payer: Medicare Other | Source: Ambulatory Visit | Attending: Internal Medicine | Admitting: Internal Medicine

## 2016-02-15 DIAGNOSIS — E039 Hypothyroidism, unspecified: Secondary | ICD-10-CM | POA: Diagnosis not present

## 2016-02-15 DIAGNOSIS — I10 Essential (primary) hypertension: Secondary | ICD-10-CM | POA: Insufficient documentation

## 2016-02-15 DIAGNOSIS — E1142 Type 2 diabetes mellitus with diabetic polyneuropathy: Secondary | ICD-10-CM | POA: Insufficient documentation

## 2016-02-15 DIAGNOSIS — I6521 Occlusion and stenosis of right carotid artery: Secondary | ICD-10-CM

## 2016-02-15 DIAGNOSIS — I6523 Occlusion and stenosis of bilateral carotid arteries: Secondary | ICD-10-CM | POA: Diagnosis not present

## 2016-02-15 DIAGNOSIS — E785 Hyperlipidemia, unspecified: Secondary | ICD-10-CM | POA: Insufficient documentation

## 2016-02-15 LAB — VAS US CAROTID
LCCAPSYS: 107 cm/s
LEFT ECA DIAS: 28 cm/s
LEFT VERTEBRAL DIAS: -16 cm/s
LICAPSYS: -187 cm/s
Left CCA dist dias: 30 cm/s
Left CCA dist sys: 133 cm/s
Left CCA prox dias: 25 cm/s
Left ICA prox dias: -42 cm/s
RCCADSYS: -76 cm/s
RIGHT CCA MID DIAS: 15 cm/s
RIGHT ECA DIAS: 17 cm/s
RIGHT VERTEBRAL DIAS: -14 cm/s
Right CCA prox dias: 9 cm/s
Right CCA prox sys: 105 cm/s

## 2016-02-17 ENCOUNTER — Other Ambulatory Visit: Payer: Self-pay

## 2016-02-17 ENCOUNTER — Ambulatory Visit (INDEPENDENT_AMBULATORY_CARE_PROVIDER_SITE_OTHER): Payer: Medicare Other | Admitting: Vascular Surgery

## 2016-02-17 ENCOUNTER — Encounter: Payer: Self-pay | Admitting: Vascular Surgery

## 2016-02-17 VITALS — BP 151/84 | HR 82 | Temp 98.0°F | Resp 16 | Ht 63.0 in | Wt 164.0 lb

## 2016-02-17 DIAGNOSIS — I6521 Occlusion and stenosis of right carotid artery: Secondary | ICD-10-CM | POA: Diagnosis not present

## 2016-02-17 DIAGNOSIS — G458 Other transient cerebral ischemic attacks and related syndromes: Secondary | ICD-10-CM

## 2016-02-17 NOTE — Progress Notes (Signed)
Vitals:   02/17/16 1301 02/17/16 1303  BP: (!) 153/90 (!) 148/80  Pulse: 89 82  Resp: 16   Temp: 98 F (36.7 C)   SpO2: 99%   Weight: 164 lb (74.4 kg)   Height: 5\' 3"  (1.6 m)

## 2016-02-17 NOTE — Progress Notes (Signed)
History of Present Illness:  Patient is a 70 y.o. year old female who presents for evaluation of carotid stenosis.  She is known to our office and was last seen By Louis Matte, NP for ICA stenosis.  Her last carotid duplex on 09/22/2013 was bilateral carotids < 59% and she was  asymptomatic.    She states she was at the bank July 7th and was feeling dizzy.  Once in the bank she asked for something to eat thinking her glucose was low due to her known DM.  Then her left arm felt tingly and weak, and she felt her head pounding.  The bank attendant called 911.  She was diagnosed with TIA.  Carotid duplex at [facility] showed right ICA stenosis 79% and CT of the brain was normal.  Her symptoms did subside and she is back to baseline.    Past medical history includes HTN managed with amlodipine/valsartan, DM managed with insulin, and hyperlipidemia managed with pravastatin 1/4 40 mg tab.     Past Medical History:  Diagnosis Date  . Anemia   . Carotid artery occlusion   . Diabetes mellitus without complication ([place])   . Diverticulitis of colon   . Gout   . Gout   . Hx of colonic polyps   . Hyperlipidemia   . Hypertension   . Hypothyroidism   . Idiopathic peripheral neuropathy ([facility])   . Osteoarthritis   . Stroke [facility])     Past Surgical History:  Procedure Laterality Date  . ABDOMINAL HYSTERECTOMY    . APPENDECTOMY    . CHOLECYSTECTOMY  1991  . NASAL SINUS SURGERY  1975  . POLYPECTOMY  1985     Social History Social History  Substance Use Topics  . Smoking status: Former Smoker    Start date: 07/16/1988  . Smokeless tobacco: Never Used  . Alcohol use No    Family History Family History  Problem Relation Age of Onset  . Hypertension Mother   . Stroke Father   . Diabetes Father   . Heart disease Father     before age 28  . Hyperlipidemia Father   . Hypertension Father   . Other Father     amputation of great toe  . Diabetes Sister      Allergies  Allergies  Allergen Reactions  . Codeine   . Demerol [Meperidine]   . Diltiazem   . Metformin And Related   . Statins      Current Outpatient Prescriptions  Medication Sig Dispense Refill  . allopurinol (ZYLOPRIM) 300 MG tablet Take 300 mg by mouth daily.    Marland Kitchen amLODipine-valsartan (EXFORGE) 5-320 MG per tablet Take 1 tablet by mouth daily.    Marland Kitchen aspirin 325 MG tablet Take 325 mg by mouth daily.    . folic acid (FOLVITE) 1 MG tablet Take 1 mg by mouth 2 (two) times daily.     Marland Kitchen levothyroxine (SYNTHROID, LEVOTHROID) 100 MCG tablet Take 100 mcg by mouth daily before breakfast.    . Omega-3 Fatty Acids (FISH OIL) 1000 MG CAPS Take 1 capsule by mouth 2 (two) times daily.    Marland Kitchen PARoxetine (PAXIL) 20 MG tablet Take 20 mg by mouth every morning. Take 1/2 daily    . Red Yeast Rice 600 MG TABS Take 1 tablet by mouth daily.    . insulin glargine (LANTUS) 100 UNIT/ML injection Inject 0.7 mLs (70 Units total) into the skin every morning. And syringes  2/day (Patient not taking: Reported on 02/17/2016) 30 mL 11   No current facility-administered medications for this visit.     ROS:   General:  No weight loss, Fever, chills  HEENT: positive recent headaches, no nasal bleeding, no visual changes, no sore throat  Neurologic: positive dizziness, blackouts, seizures. with recent symptoms of stroke or mini- stroke left UE tingling and weakness No recent episodes of slurred speech, or temporary blindness.  Cardiac: No recent episodes of chest pain/pressure, no shortness of breath at rest.  No shortness of breath with exertion.  Denies history of atrial fibrillation or irregular heartbeat  Vascular: No history of rest pain in feet.  No history of claudication.  No history of non-healing ulcer, No history of DVT   Pulmonary: No home oxygen, no productive cough, no hemoptysis,  No asthma or wheezing  Musculoskeletal:  [ ]  Arthritis, [ ]  Low back pain,  [ ]  Joint pain  Hematologic:No  history of hypercoagulable state.  No history of easy bleeding.  No history of anemia  Gastrointestinal: No hematochezia or melena,  No gastroesophageal reflux, no trouble swallowing  Urinary: [ ]  chronic Kidney disease, [ ]  on HD - [ ]  MWF or [ ]  TTHS, [ ]  Burning with urination, [ ]  Frequent urination, [ ]  Difficulty urinating;   Skin: No rashes, B UEechymosis  Psychological: No history of anxiety,  No history of depression   Physical Examination  Vitals:   02/17/16 1301 02/17/16 1303 02/17/16 1304  BP: (!) 153/90 (!) 148/80 (!) 151/84  Pulse: 89 82 82  Resp: 16    Temp: 98 F (36.7 C)    SpO2: 99%    Weight: 164 lb (74.4 kg)    Height: 5\' 3"  (1.6 m)      Body mass index is 29.05 kg/m.  General:  Alert and oriented, no acute distress HEENT: Normal Neck: No bruit or JVD Pulmonary: Clear to auscultation bilaterally Cardiac: Regular Rate and Rhythm positive murmur Gastrointestinal: Soft, non-tender, non-distended, no mass, no scars Skin: No rash Extremity Pulses:  2+ radial, brachial, femoral, dorsalis pedis, posterior tibial pulses bilaterally Musculoskeletal: No deformity or edema  Neurologic: Upper and lower extremity motor 5/5 and symmetric  DATA:  Carotid duplex today in our office 02/17/2016 Right carotid stenosis 60-79% with velocity of 344 cm/s proximal ICA Left 40-59%with velocity of 252 cm/s   ASSESSMENT:  TIA with left UE tingling and weakness, dizziness and HA. Right carotid stenosis   PLAN: She has symptomatic carotid stenosis.  We are recommending right carotid endarterectomy surgery by Dr. Servando Snare  02/27/2016.  She has an appointment  with her cardiologist Mon. 02/20/2016  for further cardiac evaluation and we will ask for cardiac clearance.  The patient agrees with Dr. Claretha Cooper assessment t and agrees to surgery.     Theda Sers, EMMA MAUREEN PA-C [facility]  The patient was seen by Dr. Servando Snare in the office  today  Patient seen and evaluated with Gerri Lins, PA and agree with above. Patient to be evaluated by cardiology next week and plan for Right carotid endarterectomy on 8.14 pending cardiac clearance for symptomatic right carotid stenosis. Discussed risks benefits and alternatives to procedure specifically 1-2% risk of stroke, death, bleeding and infection and she agrees to proceed.   Naidelin Gugliotta C. Donzetta Matters, MD [facility] [place] Office: [phone] Pager: [phone]

## 2016-02-20 DIAGNOSIS — I350 Nonrheumatic aortic (valve) stenosis: Secondary | ICD-10-CM | POA: Diagnosis not present

## 2016-02-20 DIAGNOSIS — R55 Syncope and collapse: Secondary | ICD-10-CM | POA: Diagnosis not present

## 2016-02-22 ENCOUNTER — Encounter (HOSPITAL_COMMUNITY)
Admission: RE | Admit: 2016-02-22 | Discharge: 2016-02-22 | Disposition: A | Discharge status: Home / Self Care | Payer: Medicare Other | Source: Ambulatory Visit | Attending: Vascular Surgery | Admitting: Vascular Surgery

## 2016-02-22 ENCOUNTER — Encounter (HOSPITAL_COMMUNITY): Payer: Self-pay

## 2016-02-22 DIAGNOSIS — E785 Hyperlipidemia, unspecified: Secondary | ICD-10-CM | POA: Insufficient documentation

## 2016-02-22 DIAGNOSIS — E039 Hypothyroidism, unspecified: Secondary | ICD-10-CM | POA: Insufficient documentation

## 2016-02-22 DIAGNOSIS — I6523 Occlusion and stenosis of bilateral carotid arteries: Secondary | ICD-10-CM | POA: Diagnosis not present

## 2016-02-22 DIAGNOSIS — K219 Gastro-esophageal reflux disease without esophagitis: Secondary | ICD-10-CM | POA: Diagnosis not present

## 2016-02-22 DIAGNOSIS — I10 Essential (primary) hypertension: Secondary | ICD-10-CM | POA: Diagnosis not present

## 2016-02-22 DIAGNOSIS — Z79899 Other long term (current) drug therapy: Secondary | ICD-10-CM | POA: Diagnosis not present

## 2016-02-22 DIAGNOSIS — E119 Type 2 diabetes mellitus without complications: Secondary | ICD-10-CM | POA: Diagnosis not present

## 2016-02-22 DIAGNOSIS — Z7984 Long term (current) use of oral hypoglycemic drugs: Secondary | ICD-10-CM | POA: Insufficient documentation

## 2016-02-22 DIAGNOSIS — Z87891 Personal history of nicotine dependence: Secondary | ICD-10-CM | POA: Diagnosis not present

## 2016-02-22 DIAGNOSIS — Z01812 Encounter for preprocedural laboratory examination: Secondary | ICD-10-CM | POA: Insufficient documentation

## 2016-02-22 DIAGNOSIS — R9431 Abnormal electrocardiogram [ECG] [EKG]: Secondary | ICD-10-CM | POA: Diagnosis not present

## 2016-02-22 DIAGNOSIS — Z0183 Encounter for blood typing: Secondary | ICD-10-CM | POA: Insufficient documentation

## 2016-02-22 DIAGNOSIS — Z01818 Encounter for other preprocedural examination: Secondary | ICD-10-CM | POA: Diagnosis not present

## 2016-02-22 DIAGNOSIS — Z7982 Long term (current) use of aspirin: Secondary | ICD-10-CM | POA: Diagnosis not present

## 2016-02-22 HISTORY — DX: Transient cerebral ischemic attack, unspecified: G45.9

## 2016-02-22 HISTORY — DX: Personal history of other diseases of the digestive system: Z87.19

## 2016-02-22 HISTORY — DX: Other specified postprocedural states: Z98.890

## 2016-02-22 HISTORY — DX: Gastro-esophageal reflux disease without esophagitis: K21.9

## 2016-02-22 HISTORY — DX: Headache, unspecified: R51.9

## 2016-02-22 HISTORY — DX: Other complications of anesthesia, initial encounter: T88.59XA

## 2016-02-22 HISTORY — DX: Adverse effect of unspecified anesthetic, initial encounter: T41.45XA

## 2016-02-22 HISTORY — DX: Headache: R51

## 2016-02-22 HISTORY — DX: Nausea with vomiting, unspecified: R11.2

## 2016-02-22 LAB — URINALYSIS, ROUTINE W REFLEX MICROSCOPIC
BILIRUBIN URINE: NEGATIVE
GLUCOSE, UA: NEGATIVE mg/dL
HGB URINE DIPSTICK: NEGATIVE
Ketones, ur: NEGATIVE mg/dL
Leukocytes, UA: NEGATIVE
Nitrite: NEGATIVE
PH: 5.5 (ref 5.0–8.0)
Protein, ur: NEGATIVE mg/dL
SPECIFIC GRAVITY, URINE: 1.018 (ref 1.005–1.030)

## 2016-02-22 LAB — CBC
HEMATOCRIT: 39.2 % (ref 36.0–46.0)
Hemoglobin: 12.8 g/dL (ref 12.0–15.0)
MCH: 30 pg (ref 26.0–34.0)
MCHC: 32.7 g/dL (ref 30.0–36.0)
MCV: 91.8 fL (ref 78.0–100.0)
Platelets: 268 10*3/uL (ref 150–400)
RBC: 4.27 MIL/uL (ref 3.87–5.11)
RDW: 13.8 % (ref 11.5–15.5)
WBC: 6.5 10*3/uL (ref 4.0–10.5)

## 2016-02-22 LAB — COMPREHENSIVE METABOLIC PANEL
ALBUMIN: 3.8 g/dL (ref 3.5–5.0)
ALT: 25 U/L (ref 14–54)
ANION GAP: 8 (ref 5–15)
AST: 26 U/L (ref 15–41)
Alkaline Phosphatase: 63 U/L (ref 38–126)
BILIRUBIN TOTAL: 0.5 mg/dL (ref 0.3–1.2)
BUN: 19 mg/dL (ref 6–20)
CO2: 22 mmol/L (ref 22–32)
Calcium: 9.3 mg/dL (ref 8.9–10.3)
Chloride: 108 mmol/L (ref 101–111)
Creatinine, Ser: 0.97 mg/dL (ref 0.44–1.00)
GFR calc Af Amer: >60 mL/min (ref >60)
GFR calc non Af Amer: 58 mL/min — ABNORMAL LOW (ref >60)
GLUCOSE: 131 mg/dL — AB (ref 65–99)
POTASSIUM: 4 mmol/L (ref 3.5–5.1)
Sodium: 138 mmol/L (ref 135–145)
TOTAL PROTEIN: 6.3 g/dL — AB (ref 6.5–8.1)

## 2016-02-22 LAB — APTT: APTT: 28 s (ref 24–36)

## 2016-02-22 LAB — TYPE AND SCREEN
ABO/RH(D): A POS
ANTIBODY SCREEN: NEGATIVE

## 2016-02-22 LAB — PROTIME-INR
INR: 1.02
PROTHROMBIN TIME: 13.5 s (ref 11.4–15.2)

## 2016-02-22 LAB — SURGICAL PCR SCREEN
MRSA, PCR: POSITIVE — AB
Staphylococcus aureus: POSITIVE — AB

## 2016-02-22 LAB — GLUCOSE, CAPILLARY: GLUCOSE-CAPILLARY: 218 mg/dL — AB (ref 65–99)

## 2016-02-22 LAB — ABO/RH: ABO/RH(D): A POS

## 2016-02-22 NOTE — Progress Notes (Signed)
REQUESTED ECHO, EKG, HOLTOR MONITOR TEST, OV, ANY CARDIAC TEST FROM [facility] IN [place].   SPOKE WITH ANGELA KABBE RE: HX OF TIA IN July. ANGELA TO REVIEW CHART.

## 2016-02-22 NOTE — Progress Notes (Signed)
Mupirocin Ointment Rx called into [facility] in [place] for positive MRSA and Staph PCR. Pt notified and voiced understanding

## 2016-02-22 NOTE — Pre-Procedure Instructions (Signed)
SIMRANPREET FALZON  02/22/2016      [facility] [place], [place] - [place] [id number] [place] [id number] Phone: [phone] Fax: [place], [place], [place] [address] Phone: [phone] Fax: [phone]    Your procedure is scheduled on  Monday 02/27/16  Report to [place] at 530 A.M.  Call this number if you have problems the morning of surgery:  [phone]   Remember:  Do not eat food or drink liquids after midnight.  Take these medicines the morning of surgery with A SIP OF WATER  ALLOPURINOL, LEVOTHYROXINE, OMEPRAZOLE    How to Manage Your Diabetes Before and After Surgery  Why is it important to control my blood sugar before and after surgery? . Improving blood sugar levels before and after surgery helps healing and can limit problems. . A way of improving blood sugar control is eating a healthy diet by: o  Eating less sugar and carbohydrates o  Increasing activity/exercise o  Talking with your doctor about reaching your blood sugar goals . High blood sugars (greater than 180 mg/dL) can raise your risk of infections and slow your recovery, so you will need to focus on controlling your diabetes during the weeks before surgery. . Make sure that the doctor who takes care of your diabetes knows about your planned surgery including the date and location.  How do I manage my blood sugar before surgery? . Check your blood sugar at least 4 times a day, starting 2 days before surgery, to make sure that the level is not too high or low. o Check your blood sugar the morning of your surgery when you wake up and every 2 hours until you get to the Short Stay unit. . If your blood sugar is less than 70 mg/dL, you will need to treat for low blood sugar: o Do not take insulin. o Treat a low blood sugar (less than 70 mg/dL) with  cup of clear juice  (cranberry or apple), 4 glucose tablets, OR glucose gel. o Recheck blood sugar in 15 minutes after treatment (to make sure it is greater than 70 mg/dL). If your blood sugar is not greater than 70 mg/dL on recheck, call [phone] for further instructions. . Report your blood sugar to the short stay nurse when you get to Short Stay.  . If you are admitted to the hospital after surgery: o Your blood sugar will be checked by the staff and you will probably be given insulin after surgery (instead of oral diabetes medicines) to make sure you have good blood sugar levels. o The goal for blood sugar control after surgery is 80-180 mg/dL.              WHAT DO I DO ABOUT MY DIABETES MEDICATION?   Marland Kitchen Do not take oral diabetes medicines (pills) the morning of surgery.  . THE NIGHT BEFORE SURGERY, take ___________ units of ___________insulin.       Marland Kitchen HE MORNING OF SURGERY, take _________NONE ____ units of ___NONE _______insulin.  . The day of surgery, do not take other diabetes injectables, including Byetta (exenatide), Bydureon (exenatide ER), Victoza (liraglutide), or Trulicity (dulaglutide).  . If your CBG is greater than 220 mg/dL, you may take  of your sliding scale (correction) dose of insulin.  Other Instructions:  Patient Signature:  Date:   Nurse Signature:  Date:   Reviewed and Endorsed by [facility] Patient Education Committee, August 2015  Do not wear jewelry, make-up or nail polish.  Do not wear lotions, powders, or perfumes.  You may wear deoderant.  Do not shave 48 hours prior to surgery.  Men may shave face and neck.  Do not bring valuables to the hospital.  [facility] is not responsible for any belongings or valuables.  Contacts, dentures or bridgework may not be worn into surgery.  Leave your suitcase in the car.  After surgery it may be brought to your room.  For patients admitted to the hospital, discharge time will be determined by your  treatment team.  Patients discharged the day of surgery will not be allowed to drive home.   Name and phone number of your driver:    SPECIAL instructions:    Please read over the following fact sheets that you were given. Pain Booklet, Blood Transfusion Information, MRSA Information and Surgical Site Infection Prevention

## 2016-02-22 NOTE — Pre-Procedure Instructions (Signed)
Meredith Young  02/22/2016      [facility], [place] [id number] [place] [id number] Phone: [phone] Fax: [phone]    Your procedure is scheduled on  Monday 02/27/16  Report to [facility] Admitting at 530 A.M.  Call this number if you have problems the morning of surgery:  [phone]   Remember:  Do not eat food or drink liquids after midnight.  Take these medicines the morning of surgery with A SIP OF WATER  ALLOPURINOL, LEVOTHYROXINE, OMEPRAZOLE    How to Manage Your Diabetes Before and After Surgery  Why is it important to control my blood sugar before and after surgery? . Improving blood sugar levels before and after surgery helps healing and can limit problems. . A way of improving blood sugar control is eating a healthy diet by: o  Eating less sugar and carbohydrates o  Increasing activity/exercise o  Talking with your doctor about reaching your blood sugar goals . High blood sugars (greater than 180 mg/dL) can raise your risk of infections and slow your recovery, so you will need to focus on controlling your diabetes during the weeks before surgery. . Make sure that the doctor who takes care of your diabetes knows about your planned surgery including the date and location.  How do I manage my blood sugar before surgery? . Check your blood sugar at least 4 times a day, starting 2 days before surgery, to make sure that the level is not too high or low. o Check your blood sugar the morning of your surgery when you wake up and every 2 hours until you get to the Short Stay unit. . If your blood sugar is less than 70 mg/dL, you will need to treat for low blood sugar: o Do not take insulin. o Treat a low blood sugar (less than 70 mg/dL) with  cup of clear juice (cranberry or apple), 4 glucose tablets, OR glucose gel. o Recheck blood sugar in 15 minutes after treatment (to make sure it is greater than 70  mg/dL). If your blood sugar is not greater than 70 mg/dL on recheck, call [phone] for further instructions. . Report your blood sugar to the short stay nurse when you get to Short Stay.  . If you are admitted to the hospital after surgery: o Your blood sugar will be checked by the staff and you will probably be given insulin after surgery (instead of oral diabetes medicines) to make sure you have good blood sugar levels. o The goal for blood sugar control after surgery is 80-180 mg/dL.              WHAT DO I DO ABOUT MY DIABETES MEDICATION?   Marland Kitchen Do not take oral diabetes medicines (pills) the morning of surgery.  . THE NIGHT BEFORE SURGERY, take ___________ units of ___________insulin.       Marland Kitchen HE MORNING OF SURGERY, take _____________ units of __________insulin.  . The day of surgery, do not take other diabetes injectables, including Byetta (exenatide), Bydureon (exenatide ER), Victoza (liraglutide), or Trulicity (dulaglutide).  . If your CBG is greater than 220 mg/dL, you may take  of your sliding scale (correction) dose of insulin.  Other Instructions:          Patient Signature:  Date:   Nurse Signature:  Date:   Reviewed and Endorsed by [facility] Patient Education Committee, August 2015  Do not wear  jewelry, make-up or nail polish.  Do not wear lotions, powders, or perfumes.  You may wear deoderant.  Do not shave 48 hours prior to surgery.  Men may shave face and neck.  Do not bring valuables to the hospital.  [facility] is not responsible for any belongings or valuables.  Contacts, dentures or bridgework may not be worn into surgery.  Leave your suitcase in the car.  After surgery it may be brought to your room.  For patients admitted to the hospital, discharge time will be determined by your treatment team.  Patients discharged the day of surgery will not be allowed to drive home.   Name and phone number of your driver:    SPECIAL instructions:      Please read over the following fact sheets that you were given. Pain Booklet, Blood Transfusion Information, MRSA Information and Surgical Site Infection Prevention

## 2016-02-23 ENCOUNTER — Encounter (HOSPITAL_COMMUNITY): Payer: Self-pay

## 2016-02-23 LAB — HEMOGLOBIN A1C
HEMOGLOBIN A1C: 6.7 % — AB (ref 4.8–5.6)
MEAN PLASMA GLUCOSE: 146 mg/dL

## 2016-02-23 NOTE — Progress Notes (Signed)
Anesthesia Chart Review:  Pt is a 70 year old female scheduled for R CEA on 02/27/2016 with Servando Snare, MD.   Cardiologist is Shirlee More, MD (care everywhere); pt has cardiac clearance for surgery from Dr. Jimmie Molly, one of Dr. Joya Gaskins colleagues.   PMH includes:  HTN, DM, hyperlipidemia, hypothyroidism, carotid artery stenosis, GERD. Former smoker. BMI 30  Medications include: amlodipine-valsartan, ASA, liraglutide, prilosec, pravastatin  Preoperative labs reviewed.  HgbA1c 6.7, glucose 131  EKG 02/22/16: NSR. Left axis deviation. Low voltage QRS. Septal infarct, age undetermined. Possible Inferior infarct, age undetermined  Holter monitor 02/01/16: no arrhythmias identified  Carotid duplex 01/26/16:  1. 70-99% proximal R ICA stenosis 2. 50-69% proximal L ICA stenosis  Echo 02/20/16:  1. Mild concentric LVH, normal systolic function 2. Very mild aortic stenosis 3. Mitral annular calcification, no regurgitation 4. Normal R heart size, function, only trace TR  If no changes, I anticipate pt can proceed with surgery as scheduled.   Willeen Cass, FNP-BC [facility] Short Stay Surgical Center/Anesthesiology Phone: [phone] 02/24/2016 4:20 PM

## 2016-02-26 MED ORDER — DEXTROSE 5 % IV SOLN
1.5000 g | INTRAVENOUS | Status: AC
  Administered 2016-02-27: 1.5 g via INTRAVENOUS
  Filled 2016-02-26: qty 1.5

## 2016-02-26 NOTE — Anesthesia Preprocedure Evaluation (Addendum)
Anesthesia Evaluation  Patient identified by MRN, date of birth, ID band Patient awake    Reviewed: Allergy & Precautions, NPO status , Patient's Chart, lab work & pertinent test results  Airway Mallampati: II  TM Distance: >3 FB Neck ROM: Full    Dental  (+) Lower Dentures, Upper Dentures   Pulmonary former smoker,    breath sounds clear to auscultation       Cardiovascular hypertension, Pt. on medications + Peripheral Vascular Disease   Rhythm:Regular Rate:Normal  Echo 02/20/16:  1. Mild concentric LVH, normal systolic function 2. Very mild aortic stenosis 3. Mitral annular calcification, no regurgitation 4. Normal R heart size, function, only trace TR   Neuro/Psych Anxiety Depression TIA   GI/Hepatic Neg liver ROS, hiatal hernia, GERD  ,  Endo/Other  diabetes, Type 2, Insulin DependentHypothyroidism   Renal/GU negative Renal ROS     Musculoskeletal  (+) Arthritis ,   Abdominal   Peds  Hematology negative hematology ROS (+)   Anesthesia Other Findings   Reproductive/Obstetrics                            Lab Results  Component Value Date   WBC 6.5 02/22/2016   HGB 12.8 02/22/2016   HCT 39.2 02/22/2016   MCV 91.8 02/22/2016   PLT 268 02/22/2016   Lab Results  Component Value Date   CREATININE 0.97 02/22/2016   BUN 19 02/22/2016   NA 138 02/22/2016   K 4.0 02/22/2016   CL 108 02/22/2016   CO2 22 02/22/2016    Anesthesia Physical Anesthesia Plan  ASA: III  Anesthesia Plan: General   Post-op Pain Management:    Induction: Intravenous  Airway Management Planned: Oral ETT  Additional Equipment: Arterial line  Intra-op Plan:   Post-operative Plan: Extubation in OR  Informed Consent: I have reviewed the patients History and Physical, chart, labs and discussed the procedure including the risks, benefits and alternatives for the proposed anesthesia with the patient or  authorized representative who has indicated his/her understanding and acceptance.     Plan Discussed with: CRNA  Anesthesia Plan Comments:        Anesthesia Quick Evaluation

## 2016-02-27 ENCOUNTER — Inpatient Hospital Stay (HOSPITAL_COMMUNITY): Payer: Medicare Other | Admitting: Emergency Medicine

## 2016-02-27 ENCOUNTER — Inpatient Hospital Stay (HOSPITAL_COMMUNITY): Payer: Medicare Other | Admitting: Anesthesiology

## 2016-02-27 ENCOUNTER — Inpatient Hospital Stay (HOSPITAL_COMMUNITY)
Admission: RE | Admit: 2016-02-27 | Discharge: 2016-02-28 | DRG: 039 | Disposition: A | Discharge status: Home / Self Care | Payer: Medicare Other | Source: Ambulatory Visit | Attending: Vascular Surgery | Admitting: Vascular Surgery

## 2016-02-27 ENCOUNTER — Encounter (HOSPITAL_COMMUNITY): Payer: Self-pay | Admitting: General Practice

## 2016-02-27 ENCOUNTER — Encounter (HOSPITAL_COMMUNITY)
Admission: RE | Disposition: A | Discharge status: Home / Self Care | Payer: Self-pay | Source: Ambulatory Visit | Attending: Vascular Surgery

## 2016-02-27 DIAGNOSIS — Z794 Long term (current) use of insulin: Secondary | ICD-10-CM | POA: Diagnosis not present

## 2016-02-27 DIAGNOSIS — I10 Essential (primary) hypertension: Secondary | ICD-10-CM | POA: Diagnosis present

## 2016-02-27 DIAGNOSIS — E785 Hyperlipidemia, unspecified: Secondary | ICD-10-CM | POA: Diagnosis not present

## 2016-02-27 DIAGNOSIS — G609 Hereditary and idiopathic neuropathy, unspecified: Secondary | ICD-10-CM | POA: Diagnosis present

## 2016-02-27 DIAGNOSIS — K219 Gastro-esophageal reflux disease without esophagitis: Secondary | ICD-10-CM | POA: Diagnosis present

## 2016-02-27 DIAGNOSIS — I6523 Occlusion and stenosis of bilateral carotid arteries: Secondary | ICD-10-CM

## 2016-02-27 DIAGNOSIS — I6521 Occlusion and stenosis of right carotid artery: Secondary | ICD-10-CM | POA: Diagnosis not present

## 2016-02-27 DIAGNOSIS — Z7982 Long term (current) use of aspirin: Secondary | ICD-10-CM | POA: Diagnosis not present

## 2016-02-27 DIAGNOSIS — M109 Gout, unspecified: Secondary | ICD-10-CM | POA: Diagnosis not present

## 2016-02-27 DIAGNOSIS — E1151 Type 2 diabetes mellitus with diabetic peripheral angiopathy without gangrene: Secondary | ICD-10-CM | POA: Diagnosis present

## 2016-02-27 DIAGNOSIS — M1990 Unspecified osteoarthritis, unspecified site: Secondary | ICD-10-CM | POA: Diagnosis not present

## 2016-02-27 DIAGNOSIS — Z87891 Personal history of nicotine dependence: Secondary | ICD-10-CM

## 2016-02-27 DIAGNOSIS — Z8673 Personal history of transient ischemic attack (TIA), and cerebral infarction without residual deficits: Secondary | ICD-10-CM | POA: Diagnosis not present

## 2016-02-27 DIAGNOSIS — E039 Hypothyroidism, unspecified: Secondary | ICD-10-CM | POA: Diagnosis present

## 2016-02-27 HISTORY — PX: PATCH ANGIOPLASTY: SHX6230

## 2016-02-27 HISTORY — PX: ENDARTERECTOMY: SHX5162

## 2016-02-27 HISTORY — DX: Occlusion and stenosis of bilateral carotid arteries: I65.23

## 2016-02-27 LAB — GLUCOSE, CAPILLARY
GLUCOSE-CAPILLARY: 154 mg/dL — AB (ref 65–99)
GLUCOSE-CAPILLARY: 160 mg/dL — AB (ref 65–99)
GLUCOSE-CAPILLARY: 110 mg/dL — AB (ref 65–99)
GLUCOSE-CAPILLARY: 120 mg/dL — AB (ref 65–99)

## 2016-02-27 LAB — CBC
HCT: 32.7 % — ABNORMAL LOW (ref 36.0–46.0)
Hemoglobin: 10.5 g/dL — ABNORMAL LOW (ref 12.0–15.0)
MCH: 29.9 pg (ref 26.0–34.0)
MCHC: 32.1 g/dL (ref 30.0–36.0)
MCV: 93.2 fL (ref 78.0–100.0)
PLATELETS: 186 10*3/uL (ref 150–400)
RBC: 3.51 MIL/uL — AB (ref 3.87–5.11)
RDW: 13.9 % (ref 11.5–15.5)
WBC: 6.6 10*3/uL (ref 4.0–10.5)

## 2016-02-27 LAB — CREATININE, SERUM
CREATININE: 0.84 mg/dL (ref 0.44–1.00)
GFR calc Af Amer: >60 mL/min (ref >60)

## 2016-02-27 SURGERY — ENDARTERECTOMY, CAROTID
Anesthesia: General | Site: Neck | Laterality: Right

## 2016-02-27 MED ORDER — METOPROLOL TARTRATE 5 MG/5ML IV SOLN: 2.0000 mg | INTRAVENOUS | Status: DC | PRN

## 2016-02-27 MED ORDER — PRAVASTATIN SODIUM 40 MG PO TABS
40.0000 mg | ORAL_TABLET | Freq: Every day | ORAL | Status: DC
  Administered 2016-02-27: 40 mg via ORAL
  Filled 2016-02-27: qty 1

## 2016-02-27 MED ORDER — POTASSIUM CHLORIDE CRYS ER 20 MEQ PO TBCR
20.0000 meq | EXTENDED_RELEASE_TABLET | Freq: Every day | ORAL | Status: DC | PRN
Start: 2016-02-27 — End: 2016-02-28

## 2016-02-27 MED ORDER — PHENOL 1.4 % MT LIQD: 1.0000 | OROMUCOSAL | Status: DC | PRN

## 2016-02-27 MED ORDER — SODIUM CHLORIDE 0.9 % IV SOLN
INTRAVENOUS | Status: DC
  Administered 2016-02-27 (×2): via INTRAVENOUS

## 2016-02-27 MED ORDER — PROTAMINE SULFATE 10 MG/ML IV SOLN
INTRAVENOUS | Status: DC | PRN
  Administered 2016-02-27: 20 mg via INTRAVENOUS

## 2016-02-27 MED ORDER — OXYCODONE-ACETAMINOPHEN 5-325 MG PO TABS
1.0000 | ORAL_TABLET | ORAL | Status: DC | PRN
  Administered 2016-02-27: 2 via ORAL
  Administered 2016-02-28: 1 via ORAL
  Administered 2016-02-28: 2 via ORAL
  Filled 2016-02-27 (×3): qty 2

## 2016-02-27 MED ORDER — LIRAGLUTIDE 18 MG/3ML ~~LOC~~ SOPN: 1.8000 mg | PEN_INJECTOR | Freq: Every day | SUBCUTANEOUS | Status: DC

## 2016-02-27 MED ORDER — LIDOCAINE 2% (20 MG/ML) 5 ML SYRINGE
INTRAMUSCULAR | Status: AC
  Filled 2016-02-27: qty 5

## 2016-02-27 MED ORDER — ENOXAPARIN SODIUM 40 MG/0.4ML ~~LOC~~ SOLN: 40.0000 mg | SUBCUTANEOUS | Status: DC

## 2016-02-27 MED ORDER — LEVOTHYROXINE SODIUM 112 MCG PO TABS
112.0000 ug | ORAL_TABLET | Freq: Every day | ORAL | Status: DC
  Administered 2016-02-28: 112 ug via ORAL
  Filled 2016-02-27: qty 1

## 2016-02-27 MED ORDER — ARTIFICIAL TEARS OP OINT
TOPICAL_OINTMENT | OPHTHALMIC | Status: DC | PRN
  Administered 2016-02-27: 1 via OPHTHALMIC

## 2016-02-27 MED ORDER — PHENYLEPHRINE 40 MCG/ML (10ML) SYRINGE FOR IV PUSH (FOR BLOOD PRESSURE SUPPORT)
PREFILLED_SYRINGE | INTRAVENOUS | Status: AC
  Filled 2016-02-27: qty 10

## 2016-02-27 MED ORDER — ACETAMINOPHEN 325 MG RE SUPP: 325.0000 mg | RECTAL | Status: DC | PRN

## 2016-02-27 MED ORDER — ONDANSETRON HCL 4 MG/2ML IJ SOLN: 4.0000 mg | Freq: Four times a day (QID) | INTRAMUSCULAR | Status: DC | PRN

## 2016-02-27 MED ORDER — IRBESARTAN 300 MG PO TABS
300.0000 mg | ORAL_TABLET | Freq: Every day | ORAL | Status: DC
Start: 2016-02-27 — End: 2016-02-28
  Administered 2016-02-28: 300 mg via ORAL
  Filled 2016-02-27: qty 1

## 2016-02-27 MED ORDER — ONDANSETRON HCL 4 MG/2ML IJ SOLN
INTRAMUSCULAR | Status: AC
  Filled 2016-02-27: qty 2

## 2016-02-27 MED ORDER — SODIUM CHLORIDE 0.9 % IV SOLN
INTRAVENOUS | Status: DC | PRN
  Administered 2016-02-27: 500 mL

## 2016-02-27 MED ORDER — ROCURONIUM BROMIDE 10 MG/ML (PF) SYRINGE
PREFILLED_SYRINGE | INTRAVENOUS | Status: AC
  Filled 2016-02-27: qty 10

## 2016-02-27 MED ORDER — FENTANYL CITRATE (PF) 100 MCG/2ML IJ SOLN
INTRAMUSCULAR | Status: DC | PRN
  Administered 2016-02-27 (×5): 50 ug via INTRAVENOUS

## 2016-02-27 MED ORDER — GLYCOPYRROLATE 0.2 MG/ML IJ SOLN
INTRAMUSCULAR | Status: DC | PRN
  Administered 2016-02-27: 0.6 mg via INTRAVENOUS

## 2016-02-27 MED ORDER — SUCCINYLCHOLINE CHLORIDE 200 MG/10ML IV SOSY
PREFILLED_SYRINGE | INTRAVENOUS | Status: AC
  Filled 2016-02-27: qty 10

## 2016-02-27 MED ORDER — DEXTRAN 40 IN SALINE 10-0.9 % IV SOLN
INTRAVENOUS | Status: AC
  Filled 2016-02-27: qty 500

## 2016-02-27 MED ORDER — HYDRALAZINE HCL 20 MG/ML IJ SOLN
5.0000 mg | INTRAMUSCULAR | Status: DC | PRN
  Administered 2016-02-27: 5 mg via INTRAVENOUS

## 2016-02-27 MED ORDER — INSULIN ASPART 100 UNIT/ML ~~LOC~~ SOLN
0.0000 [IU] | Freq: Three times a day (TID) | SUBCUTANEOUS | Status: DC
  Administered 2016-02-28: 3 [IU] via SUBCUTANEOUS

## 2016-02-27 MED ORDER — NEOSTIGMINE METHYLSULFATE 5 MG/5ML IV SOSY
PREFILLED_SYRINGE | INTRAVENOUS | Status: AC
  Filled 2016-02-27: qty 5

## 2016-02-27 MED ORDER — 0.9 % SODIUM CHLORIDE (POUR BTL) OPTIME
TOPICAL | Status: DC | PRN
Start: 2016-02-27 — End: 2016-02-27
  Administered 2016-02-27: 2000 mL

## 2016-02-27 MED ORDER — CHLORHEXIDINE GLUCONATE CLOTH 2 % EX PADS: 6.0000 | MEDICATED_PAD | Freq: Once | CUTANEOUS | Status: DC

## 2016-02-27 MED ORDER — LORATADINE 10 MG PO TABS
10.0000 mg | ORAL_TABLET | Freq: Every day | ORAL | Status: DC
  Administered 2016-02-27: 10 mg via ORAL
  Filled 2016-02-27 (×2): qty 1

## 2016-02-27 MED ORDER — MORPHINE SULFATE (PF) 2 MG/ML IV SOLN
1.0000 mg | INTRAVENOUS | Status: DC | PRN
  Administered 2016-02-27: 2 mg via INTRAVENOUS
  Filled 2016-02-27: qty 2

## 2016-02-27 MED ORDER — ALLOPURINOL 300 MG PO TABS
300.0000 mg | ORAL_TABLET | Freq: Every day | ORAL | Status: DC
  Administered 2016-02-27 – 2016-02-28 (×2): 300 mg via ORAL
  Filled 2016-02-27 (×2): qty 1

## 2016-02-27 MED ORDER — SODIUM CHLORIDE 0.9 % IV SOLN
0.0125 ug/kg/min | INTRAVENOUS | Status: AC
  Administered 2016-02-27: .15 ug/kg/min via INTRAVENOUS
  Filled 2016-02-27: qty 2000

## 2016-02-27 MED ORDER — FENTANYL CITRATE (PF) 100 MCG/2ML IJ SOLN
INTRAMUSCULAR | Status: AC
  Filled 2016-02-27: qty 2

## 2016-02-27 MED ORDER — LIDOCAINE HCL (CARDIAC) 20 MG/ML IV SOLN
INTRAVENOUS | Status: DC | PRN
  Administered 2016-02-27: 100 mg via INTRAVENOUS

## 2016-02-27 MED ORDER — PHENYLEPHRINE HCL 10 MG/ML IJ SOLN
INTRAVENOUS | Status: DC | PRN
  Administered 2016-02-27: 20 ug/min via INTRAVENOUS

## 2016-02-27 MED ORDER — PROMETHAZINE HCL 25 MG/ML IJ SOLN
6.2500 mg | INTRAMUSCULAR | Status: DC | PRN
  Administered 2016-02-27: 6.25 mg via INTRAVENOUS

## 2016-02-27 MED ORDER — GUAIFENESIN-DM 100-10 MG/5ML PO SYRP: 15.0000 mL | ORAL_SOLUTION | ORAL | Status: DC | PRN

## 2016-02-27 MED ORDER — SODIUM CHLORIDE 0.9 % IV SOLN: INTRAVENOUS | Status: DC

## 2016-02-27 MED ORDER — SODIUM CHLORIDE 0.9 % IV SOLN: 500.0000 mL | Freq: Once | INTRAVENOUS | Status: DC | PRN

## 2016-02-27 MED ORDER — FOLIC ACID 1 MG PO TABS
1.0000 mg | ORAL_TABLET | Freq: Two times a day (BID) | ORAL | Status: DC
  Administered 2016-02-27 – 2016-02-28 (×2): 1 mg via ORAL
  Filled 2016-02-27 (×2): qty 1

## 2016-02-27 MED ORDER — FENTANYL CITRATE (PF) 250 MCG/5ML IJ SOLN
INTRAMUSCULAR | Status: AC
  Filled 2016-02-27: qty 5

## 2016-02-27 MED ORDER — PROPOFOL 10 MG/ML IV BOLUS
INTRAVENOUS | Status: DC | PRN
  Administered 2016-02-27: 150 mg via INTRAVENOUS

## 2016-02-27 MED ORDER — HEMOSTATIC AGENTS (NO CHARGE) OPTIME
TOPICAL | Status: DC | PRN
  Administered 2016-02-27: 1 via TOPICAL

## 2016-02-27 MED ORDER — PROMETHAZINE HCL 25 MG/ML IJ SOLN
INTRAMUSCULAR | Status: AC
  Filled 2016-02-27: qty 1

## 2016-02-27 MED ORDER — LABETALOL HCL 5 MG/ML IV SOLN: 10.0000 mg | INTRAVENOUS | Status: DC | PRN

## 2016-02-27 MED ORDER — MAGNESIUM SULFATE 2 GM/50ML IV SOLN: 2.0000 g | Freq: Every day | INTRAVENOUS | Status: DC | PRN

## 2016-02-27 MED ORDER — DOCUSATE SODIUM 100 MG PO CAPS
100.0000 mg | ORAL_CAPSULE | Freq: Every day | ORAL | Status: DC
  Administered 2016-02-28: 100 mg via ORAL
  Filled 2016-02-27: qty 1

## 2016-02-27 MED ORDER — AMLODIPINE BESYLATE-VALSARTAN 5-320 MG PO TABS: 1.0000 | ORAL_TABLET | Freq: Every day | ORAL | Status: DC

## 2016-02-27 MED ORDER — ALUM & MAG HYDROXIDE-SIMETH 200-200-20 MG/5ML PO SUSP: 15.0000 mL | ORAL | Status: DC | PRN

## 2016-02-27 MED ORDER — LIDOCAINE HCL (PF) 1 % IJ SOLN
INTRAMUSCULAR | Status: AC
  Filled 2016-02-27: qty 30

## 2016-02-27 MED ORDER — LACTATED RINGERS IV SOLN
INTRAVENOUS | Status: DC | PRN
  Administered 2016-02-27 (×3): via INTRAVENOUS

## 2016-02-27 MED ORDER — ACETAMINOPHEN 325 MG PO TABS: 325.0000 mg | ORAL_TABLET | ORAL | Status: DC | PRN

## 2016-02-27 MED ORDER — GLYCOPYRROLATE 0.2 MG/ML IV SOSY
PREFILLED_SYRINGE | INTRAVENOUS | Status: AC
  Filled 2016-02-27: qty 3

## 2016-02-27 MED ORDER — ASPIRIN EC 81 MG PO TBEC
81.0000 mg | DELAYED_RELEASE_TABLET | Freq: Every day | ORAL | Status: DC
  Administered 2016-02-27 – 2016-02-28 (×2): 81 mg via ORAL
  Filled 2016-02-27 (×2): qty 1

## 2016-02-27 MED ORDER — CEFUROXIME SODIUM 1.5 G IJ SOLR
1.5000 g | Freq: Two times a day (BID) | INTRAMUSCULAR | Status: AC
  Administered 2016-02-27 – 2016-02-28 (×2): 1.5 g via INTRAVENOUS
  Filled 2016-02-27 (×2): qty 1.5

## 2016-02-27 MED ORDER — PROPOFOL 10 MG/ML IV BOLUS
INTRAVENOUS | Status: AC
  Filled 2016-02-27: qty 40

## 2016-02-27 MED ORDER — HYDRALAZINE HCL 20 MG/ML IJ SOLN
INTRAMUSCULAR | Status: AC
  Filled 2016-02-27: qty 1

## 2016-02-27 MED ORDER — AMLODIPINE BESYLATE 5 MG PO TABS
5.0000 mg | ORAL_TABLET | Freq: Every day | ORAL | Status: DC
  Administered 2016-02-28: 5 mg via ORAL
  Filled 2016-02-27: qty 1

## 2016-02-27 MED ORDER — LABETALOL HCL 5 MG/ML IV SOLN
INTRAVENOUS | Status: DC | PRN
  Administered 2016-02-27 (×2): 5 mg via INTRAVENOUS
  Administered 2016-02-27: 10 mg via INTRAVENOUS

## 2016-02-27 MED ORDER — PROTAMINE SULFATE 10 MG/ML IV SOLN
INTRAVENOUS | Status: AC
  Filled 2016-02-27: qty 5

## 2016-02-27 MED ORDER — ESMOLOL HCL 100 MG/10ML IV SOLN
INTRAVENOUS | Status: AC
  Filled 2016-02-27: qty 10

## 2016-02-27 MED ORDER — FENTANYL CITRATE (PF) 100 MCG/2ML IJ SOLN
25.0000 ug | INTRAMUSCULAR | Status: DC | PRN
  Administered 2016-02-27 (×3): 50 ug via INTRAVENOUS

## 2016-02-27 MED ORDER — ROCURONIUM BROMIDE 100 MG/10ML IV SOLN
INTRAVENOUS | Status: DC | PRN
  Administered 2016-02-27: 50 mg via INTRAVENOUS

## 2016-02-27 MED ORDER — HEPARIN SODIUM (PORCINE) 1000 UNIT/ML IJ SOLN
INTRAMUSCULAR | Status: DC | PRN
  Administered 2016-02-27: 8000 [IU] via INTRAVENOUS
  Administered 2016-02-27: 2000 [IU] via INTRAVENOUS

## 2016-02-27 MED ORDER — PANTOPRAZOLE SODIUM 40 MG PO TBEC
40.0000 mg | DELAYED_RELEASE_TABLET | Freq: Every day | ORAL | Status: DC
  Administered 2016-02-27 – 2016-02-28 (×2): 40 mg via ORAL
  Filled 2016-02-27 (×2): qty 1

## 2016-02-27 MED ORDER — ONDANSETRON HCL 4 MG/2ML IJ SOLN
INTRAMUSCULAR | Status: DC | PRN
  Administered 2016-02-27: 4 mg via INTRAVENOUS

## 2016-02-27 MED ORDER — NEOSTIGMINE METHYLSULFATE 10 MG/10ML IV SOLN
INTRAVENOUS | Status: DC | PRN
  Administered 2016-02-27: 3 mg via INTRAVENOUS

## 2016-02-27 SURGICAL SUPPLY — 55 items
BAG DECANTER FOR FLEXI CONT (MISCELLANEOUS) ×3 IMPLANT
CANISTER SUCTION 2500CC (MISCELLANEOUS) ×3 IMPLANT
CATH ROBINSON RED A/P 18FR (CATHETERS) ×3 IMPLANT
CATH SUCT 10FR WHISTLE TIP (CATHETERS) IMPLANT
CLIP TI MEDIUM 6 (CLIP) ×3 IMPLANT
CLIP TI WIDE RED SMALL 6 (CLIP) ×6 IMPLANT
COVER PROBE W GEL 5X96 (DRAPES) IMPLANT
CRADLE DONUT ADULT HEAD (MISCELLANEOUS) ×3 IMPLANT
ELECT REM PT RETURN 9FT ADLT (ELECTROSURGICAL) ×3
ELECTRODE REM PT RTRN 9FT ADLT (ELECTROSURGICAL) ×1 IMPLANT
GAUZE SPONGE 4X4 12PLY STRL (GAUZE/BANDAGES/DRESSINGS) ×6 IMPLANT
GLOVE BIO SURGEON STRL SZ 6.5 (GLOVE) ×2 IMPLANT
GLOVE BIO SURGEON STRL SZ7 (GLOVE) IMPLANT
GLOVE BIO SURGEONS STRL SZ 6.5 (GLOVE) ×1
GLOVE BIOGEL PI IND STRL 6.5 (GLOVE) ×2 IMPLANT
GLOVE BIOGEL PI IND STRL 7.0 (GLOVE) ×1 IMPLANT
GLOVE BIOGEL PI IND STRL 7.5 (GLOVE) IMPLANT
GLOVE BIOGEL PI INDICATOR 6.5 (GLOVE) ×4
GLOVE BIOGEL PI INDICATOR 7.0 (GLOVE) ×2
GLOVE BIOGEL PI INDICATOR 7.5 (GLOVE)
GLOVE ECLIPSE 6.5 STRL STRAW (GLOVE) ×6 IMPLANT
GOWN STRL REUS W/ TWL LRG LVL3 (GOWN DISPOSABLE) ×3 IMPLANT
GOWN STRL REUS W/TWL LRG LVL3 (GOWN DISPOSABLE) ×6
HEMOSTAT SNOW SURGICEL 2X4 (HEMOSTASIS) ×3 IMPLANT
HEMOSTAT SPONGE AVITENE ULTRA (HEMOSTASIS) IMPLANT
INSERT FOGARTY SM (MISCELLANEOUS) ×3 IMPLANT
IV ADAPTER SYR DOUBLE MALE LL (MISCELLANEOUS) ×3 IMPLANT
KIT BASIN OR (CUSTOM PROCEDURE TRAY) ×3 IMPLANT
KIT ROOM TURNOVER OR (KITS) ×3 IMPLANT
LIQUID BAND (GAUZE/BANDAGES/DRESSINGS) ×3 IMPLANT
NEEDLE HYPO 25GX1X1/2 BEV (NEEDLE) IMPLANT
NS IRRIG 1000ML POUR BTL (IV SOLUTION) ×9 IMPLANT
PACK CAROTID (CUSTOM PROCEDURE TRAY) ×3 IMPLANT
PAD ARMBOARD 7.5X6 YLW CONV (MISCELLANEOUS) ×6 IMPLANT
PATCH VASC XENOSURE 1CMX6CM (Vascular Products) ×2 IMPLANT
PATCH VASC XENOSURE 1X6 (Vascular Products) ×1 IMPLANT
SET COLLECT BLD 21X3/4 12 PB (MISCELLANEOUS) IMPLANT
SHUNT CAROTID BYPASS 10 (VASCULAR PRODUCTS) ×3 IMPLANT
SHUNT CAROTID BYPASS 12FRX15.5 (VASCULAR PRODUCTS) IMPLANT
SPONGE INTESTINAL PEANUT (DISPOSABLE) IMPLANT
STOPCOCK 4 WAY LG BORE MALE ST (IV SETS) IMPLANT
SUT ETHILON 3 0 PS 1 (SUTURE) IMPLANT
SUT MNCRL AB 4-0 PS2 18 (SUTURE) ×3 IMPLANT
SUT PROLENE 6 0 BV (SUTURE) ×15 IMPLANT
SUT PROLENE 7 0 BV 1 (SUTURE) IMPLANT
SUT SILK 3 0 TIES 17X18 (SUTURE)
SUT SILK 3-0 18XBRD TIE BLK (SUTURE) IMPLANT
SUT VIC AB 3-0 SH 27 (SUTURE) ×2
SUT VIC AB 3-0 SH 27X BRD (SUTURE) ×1 IMPLANT
SYR CONTROL 10ML LL (SYRINGE) IMPLANT
SYR TB 1ML LUER SLIP (SYRINGE) IMPLANT
SYSTEM CHEST DRAIN TLS 7FR (DRAIN) IMPLANT
TUBING ART PRESS 48 MALE/FEM (TUBING) ×3 IMPLANT
TUBING EXTENTION W/L.L. (IV SETS) ×3 IMPLANT
WATER STERILE IRR 1000ML POUR (IV SOLUTION) ×3 IMPLANT

## 2016-02-27 NOTE — Transfer of Care (Signed)
Immediate Anesthesia Transfer of Care Note  Patient: Meredith Young  Procedure(s) Performed: Procedure(s): RIGHT ENDARTERECTOMY CAROTID (Right) PATCH ANGIOPLASTY USING XENOSURE BIOLOGIC PATCH (Right)  Patient Location: PACU  Anesthesia Type:General  Level of Consciousness: awake, alert , oriented and patient cooperative  Airway & Oxygen Therapy: Patient Spontanous Breathing and Patient connected to nasal cannula oxygen  Post-op Assessment: Report given to RN, Post -op Vital signs reviewed and stable, Patient moving all extremities X 4 and Patient able to stick tongue midline  Post vital signs: Reviewed and stable  Last Vitals:  BP 161/67 (93) HR 95 RR 15 SpO2 99% on 2L O2 via [place]  Resting comfortably, maintains good airway, denies pain.   Patients Stated Pain Goal: 3 ([id number])  Complications: No apparent anesthesia complications

## 2016-02-27 NOTE — Addendum Note (Signed)
Addendum  created 02/27/16 1601 by Lowella Dell, CRNA   Anesthesia Event edited

## 2016-02-27 NOTE — H&P (Signed)
     History and Physical Update  The patient was interviewed and re-examined.  The patient's previous History and Physical has been reviewed and is unchanged last week 8.4.17.  There is no change in the plan of care: Proceed with Right carotid endarterectomy. Again discussed risks of surgery including 1-2% risk of stroke, 5% of nerve injury as well as bleeding and/or infection. Benefit is to reduce risk of stroke in this patient that has had a tia.   Neuro: 5/5 strength all extremities, cn 12 in tact, no drooping of eyelids or lip Cv: regular rhythm Pulm: non labored breathing Ext: no edema  Meredith Rieger C. Donzetta Matters, MD [facility] [place] Office: [phone] Pager: [phone]  02/27/2016, 6:56 AM

## 2016-02-27 NOTE — Anesthesia Procedure Notes (Signed)
Procedure Name: Intubation Date/Time: 02/27/2016 8:04 AM Performed by: Willeen Cass P Pre-anesthesia Checklist: Patient identified, Emergency Drugs available, Suction available, Patient being monitored and Timeout performed Patient Re-evaluated:Patient Re-evaluated prior to inductionOxygen Delivery Method: Circle system utilized Preoxygenation: Pre-oxygenation with 100% oxygen Intubation Type: IV induction Ventilation: Mask ventilation without difficulty Laryngoscope Size: Mac and 3 Grade View: Grade I Tube type: Oral Tube size: 7.0 mm Number of attempts: 1 Airway Equipment and Method: Stylet Placement Confirmation: ETT inserted through vocal cords under direct vision,  positive ETCO2 and breath sounds checked- equal and bilateral Secured at: 21 cm Tube secured with: Tape Dental Injury: Teeth and Oropharynx as per pre-operative assessment

## 2016-02-27 NOTE — Care Management Note (Signed)
Case Management Note  Patient Details  Name: Meredith Young MRN: [id number] Date of Birth: 02-Jun-1946  Subjective/Objective:   Patient is from home , s/pright carotid endarterectomy , NCM will cont to follow for dc needs.                  Action/Plan:   Expected Discharge Date:                  Expected Discharge Plan:     In-House Referral:     Discharge planning Services     Post Acute Care Choice:    Choice offered to:     DME Arranged:    DME Agency:     HH Arranged:    HH Agency:     Status of Service:     If discussed at [employer] of Stay Meetings, dates discussed:    Additional Comments:  Zenon Mayo, RN 02/27/2016, 5:34 PM

## 2016-02-27 NOTE — Op Note (Signed)
OPERATIVE NOTE  PROCEDURE:   1.  right carotid endarterectomy with bovine patch angioplasty   PRE-OPERATIVE DIAGNOSIS: right sided symptomatic carotid stenosis  POST-OPERATIVE DIAGNOSIS: same as above   SURGEON: Brandon C. Donzetta Matters, MD  ASSISTANT(S): Virgina Jock, PA  ANESTHESIA: general  ESTIMATED BLOOD LOSS: 75 cc  FINDING(S): 1.  Continuous Doppler audible flow signatures are  appropriate for each carotid artery.    INDICATIONS:   Meredith Young is a 71 y.o. female who presents with right symptomatic carotid stenosis.  I discussed with the patient the risks, benefits, and alternatives to carotid endarterectomy.  The patient is aware of the risks and agreed to proceed.   DESCRIPTION: After full informed written consent was obtained from the patient, the patient was brought back to the operating room and placed supine upon the operating table.  Prior to induction, the patient received IV antibiotics.  After obtaining adequate anesthesia, the patient was placed into semi-Fowler position with a shoulder roll in place and the patient's neck slightly hyperextended and rotated away from the surgical site.  The patient was prepped in the standard fashion for a right carotid endarterectomy.  I made an incision anterior to the sternocleidomastoid muscle and dissected down through the subcutaneous tissue.  The platysma was opened with electrocautery.  Then I dissected down to the internal jugular vein and ligated the facial branches.  This was dissected posteriorly until I obtained visualization of the common carotid artery.  This was dissected out and then an umbilical tape was placed around the common carotid artery and I loosely applied a Rumel tourniquet.  I then dissected in a periadventitial fashion along the common carotid artery up to the bifurcation.  I then identified the external carotid artery and the superior thyroid artery.  A 2-0 silk tie was looped around the superior thyroid  artery, and I also dissected out the external carotid artery and placed a vessel loop around it.  In continuing the dissection to the internal carotid artery the hypoglossal nerve was identified.  I then dissected out the internal carotid artery until I identified an area of soft tissue in the internal carotid artery.  I dissected slightly distal to this area, and placed a vessel loop around the artery.  At this point, we gave the patient a therapeutic bolus of Heparin intravenously.  After ACT returend, then I clamped the internal carotid artery, external carotid artery and then the common carotid artery.  I then made an arteriotomy in the common carotid artery with a 11 blade, and extended the arteriotomy with a Potts scissor down into the common carotid artery, then I carried the arteriotomy through the bifurcation into the internal carotid artery until I reached an area that was not diseased.  At this point, I took the 10 shunt that previously been prepared and I inserted it into the internal carotid artery.  The Rumel tourniquet was then applied to this end of the shunt.  I unclamped the shunt to verify retrograde blood flow in the internal carotid artery which was steady.  I then placed the other end of the shunt into the common carotid artery after unclamping the artery.  The Rumel was tightened down around the shunt.  At this point, I verified blood flow in the shunt with a continuous doppler.  At this point, I started the endarterectomy in the common carotid artery with a Technical brewer and carried this dissection down into the common carotid artery circumferentially.  Then I  transected the plaque at a segment where it was adherent.  I then carried this dissection up into the external carotid artery.  The plaque was extracted by unclamping the external carotid artery and everting the artery.  The dissection was then carried into the internal carotid artery, extracting the remaining portion of the  carotid plaque.  I passed the plaque off the field.   Loose debris was removed and iI was satisfied that the minimal remaining disease was densely adherent to the wall and wall integrity was intact.  At this point, I then fashioned a bovine pericardial patch for the geometry of this artery and sewed it in place with two running stitch of 6-0 Prolene, one from each end.  Prior to completing this patch angioplasty, I removed the shunt first from the internal carotid artery, from which there was excellent backbleeding, and clamped it.  Then I removed the shunt from the common carotid artery, from which there was excellent antegrade bleeding, and then clamped it.  At this point, I allowed the external carotid artery to backbleed, which was excellent.  Then I instilled heparinized saline in this patched artery and then completed the patch angioplasty in the usual fashion.  First, I released the clamp on the external carotid artery, then I released it on the common carotid artery.  After waiting a few seconds, I then released it on the internal carotid artery.  I then interrogated this patient's arteries with the continuous Doppler.  The audible waveforms in each artery were consistent with the expected characteristics for each artery.   At this point, I washed out the wound, and placed snow throughout.  I also gave the patient 20 mg of protamine to reverse his anticoagulation.   After waiting a few minutes, I removed the thrombin and Gelfoam and washed out the wound.  There was no more active bleeding in the surgical site.   I then reapproximated the platysma muscle with a running stitch of 3-0 Vicryl.  The skin was then reapproximated with a running subcuticular 4-0 Monocryl stitch.  The skin was then cleaned, dried and Dermabond was used to reinforce the skin closure.  The patient woke without any problems, neurologically intact.     COMPLICATIONS: no immediate   CONDITION: good  Brandon C. Donzetta Matters, MD [facility] [place] Office: [phone] Pager: [phone]  02/27/2016, 10:05 AM

## 2016-02-27 NOTE — Progress Notes (Signed)
Patient has not voided since before surgery this morning. Upon arrival to floor patient was bladder scanned and it revealed 622 cc of urine. Patient was catherized and  500 cc of urine drained. Will cont to monitor.

## 2016-02-27 NOTE — Anesthesia Postprocedure Evaluation (Signed)
Anesthesia Post Note  Patient: TANDI BERES  Procedure(s) Performed: Procedure(s) (LRB): RIGHT ENDARTERECTOMY CAROTID (Right) PATCH ANGIOPLASTY USING XENOSURE BIOLOGIC PATCH (Right)  Patient location during evaluation: PACU Anesthesia Type: General Level of consciousness: awake and alert Pain management: pain level controlled Vital Signs Assessment: post-procedure vital signs reviewed and stable Respiratory status: spontaneous breathing, nonlabored ventilation, respiratory function stable and patient connected to nasal cannula oxygen Cardiovascular status: blood pressure returned to baseline and stable Postop Assessment: no signs of nausea or vomiting Anesthetic complications: no    Last Vitals:  Vitals:   02/27/16 1230 02/27/16 1245  BP:  (!) 118/52  Pulse: 89 80  Resp: 15 16  Temp:      Last Pain:  Vitals:   02/27/16 1245  TempSrc:   PainSc: Tyler Deis

## 2016-02-27 NOTE — Progress Notes (Signed)
  [facility] Day of Surgery Note  Subjective:  Patient seen in PACU. Right ear and right neck hurts.   Vitals:   02/27/16 1130 02/27/16 1145  BP: (!) 126/49   Pulse: 89 73  Resp: 15 15  Temp:     Right neck incision without hematoma.  5/5 strength upper and lower extremities bilaterally. Tongue midline. Smile symmetric. No voice hoarseness    Assessment/Plan:  This is a 70 y.o. female who is s/p right carotid endarterectomy  Stable post-op.  Neuro exam intact.  Keep SBP <160.  To 3S when bed available.    Virgina Jock, [place] Pager: [phone] 02/27/2016 12:22 PM

## 2016-02-27 NOTE — Progress Notes (Signed)
       Patient alert and orinented x 3 Smile symmetric No tongue deviation Speech clear Incision soft without hematoma  S/P right CEA Disposition stable  Jaleil Renwick MAUREEN PA-C

## 2016-02-28 ENCOUNTER — Telehealth: Payer: Self-pay | Admitting: Vascular Surgery

## 2016-02-28 ENCOUNTER — Encounter (HOSPITAL_COMMUNITY): Payer: Self-pay | Admitting: Vascular Surgery

## 2016-02-28 LAB — BASIC METABOLIC PANEL
Anion gap: 5 (ref 5–15)
BUN: 11 mg/dL (ref 6–20)
CALCIUM: 8.2 mg/dL — AB (ref 8.9–10.3)
CO2: 25 mmol/L (ref 22–32)
CREATININE: 0.82 mg/dL (ref 0.44–1.00)
Chloride: 105 mmol/L (ref 101–111)
Glucose, Bld: 138 mg/dL — ABNORMAL HIGH (ref 65–99)
Potassium: 4.3 mmol/L (ref 3.5–5.1)
SODIUM: 135 mmol/L (ref 135–145)

## 2016-02-28 LAB — GLUCOSE, CAPILLARY: GLUCOSE-CAPILLARY: 152 mg/dL — AB (ref 65–99)

## 2016-02-28 MED ORDER — ONDANSETRON 8 MG PO TBDP
8.0000 mg | ORAL_TABLET | Freq: Once | ORAL | Status: AC
  Administered 2016-02-28: 8 mg via ORAL
  Filled 2016-02-28 (×2): qty 1

## 2016-02-28 MED ORDER — OXYCODONE-ACETAMINOPHEN 5-325 MG PO TABS: 1.0000 | ORAL_TABLET | Freq: Four times a day (QID) | ORAL | 0 refills | Status: DC | PRN

## 2016-02-28 NOTE — Progress Notes (Signed)
Patient to be discharged to home. PIV removed per protocol. Discharge instructions, follow up appts, and medications reviewed. Educated patient on s/s infection regarding her incision. Educated patient on when to call the MD versus when to call EMS. Answered all questions. All belongings returned to patient. Patient's visitor to provide transportation home.   Joellen Jersey, RN.

## 2016-02-28 NOTE — Telephone Encounter (Signed)
-----   Message from Mena Goes, RN sent at 02/27/2016 11:38 AM EDT ----- Regarding: schedule   ----- Message ----- From: Alvia Grove, PA-C Sent: 02/27/2016   9:57 AM To: Vvs Charge Pool  S/p right CEA 02/27/16  F/u with Dr. Donzetta Matters in 2 weeks  Thanks Maudie Mercury

## 2016-02-28 NOTE — Care Management Note (Signed)
Case Management Note  Patient Details  Name: Meredith Young MRN: [id number] Date of Birth: 07-Jan-1946  Subjective/Objective:     Patient is s/p R CEA, she is indep pta, for discharge today, she has insurance for medications and pcp and transportation, no needs.                Action/Plan:   Expected Discharge Date:                  Expected Discharge Plan:  Home/Self Care  In-House Referral:     Discharge planning Services  CM Consult  Post Acute Care Choice:    Choice offered to:     DME Arranged:    DME Agency:     HH Arranged:    HH Agency:     Status of Service:  Completed, signed off  If discussed at [employer] of Stay Meetings, dates discussed:    Additional Comments:  Zenon Mayo, RN 02/28/2016, 10:22 AM

## 2016-02-28 NOTE — Progress Notes (Addendum)
  [facility] Progress Note  Subjective  - POD #1  Right sided headache and ear ache resolved. No swallowing issues.   Objective Vitals:   02/27/16 2300 02/28/16 0501  BP: (!) 123/47 (!) 136/32  Pulse: 83 79  Resp: 15 15  Temp: 98.1 F (36.7 C) 98.4 F (36.9 C)    Intake/Output Summary (Last 24 hours) at 02/28/16 0721 Last data filed at 02/28/16 0600  Gross per 24 hour  Intake          2854.17 ml  Output              725 ml  Net          2129.17 ml   Right neck incision with mild ecchymosis around incision. No hematoma.  Tongue midline. No facial droop. Equal strength upper and lower extremities.   Assessment/Planning: 70 y.o. female is s/p: right CEA 1 Day Post-Op   Neuro exam intact.  Neck without hematoma.  Tolerating diet w/o difficulty.  D/c home today.  F/u in 2 weeks.   Modified rankin: 0 VQI: on ASA and statin.   Alvia Grove 02/28/2016 7:21 AM --  Laboratory CBC    Component Value Date/Time   WBC 6.6 02/27/2016 1556   HGB 10.5 (L) 02/27/2016 1556   HCT 32.7 (L) 02/27/2016 1556   PLT 186 02/27/2016 1556    BMET    Component Value Date/Time   NA 135 02/28/2016 0438   K 4.3 02/28/2016 0438   CL 105 02/28/2016 0438   CO2 25 02/28/2016 0438   GLUCOSE 138 (H) 02/28/2016 0438   BUN 11 02/28/2016 0438   CREATININE 0.82 02/28/2016 0438   CALCIUM 8.2 (L) 02/28/2016 0438   GFRNONAA >60 02/28/2016 0438   GFRAA >60 02/28/2016 0438    COAG Lab Results  Component Value Date   INR 1.02 02/22/2016   No results found for: PTT  Antibiotics Anti-infectives    Start     Dose/Rate Route Frequency Ordered Stop   02/27/16 2000  cefUROXime (ZINACEF) 1.5 g in dextrose 5 % 50 mL IVPB     1.5 g 100 mL/hr over 30 Minutes Intravenous Every 12 hours 02/27/16 1516 02/28/16 1959   02/27/16 0600  cefUROXime (ZINACEF) 1.5 g in dextrose 5 % 50 mL IVPB     1.5 g 100 mL/hr over 30 Minutes Intravenous To ShortStay Surgical 02/26/16 1438  02/27/16 0808       Virgina Jock, PA-C [facility] Office: [phone] Pager: [phone] 02/28/2016 7:21 AM  Addendum As above, progressing well following R CEA for symptomatic carotid stenosis. Home today on aspirin and will f/u in office in 2 weeks.   Brandon C. Donzetta Matters, MD [facility] [place] Office: [phone] Pager: [phone]

## 2016-02-28 NOTE — Telephone Encounter (Signed)
Sched appt 9/1 at 9:30. Spoke to pt to inform them of appt.

## 2016-02-29 LAB — POCT ACTIVATED CLOTTING TIME
ACTIVATED CLOTTING TIME: 219 s
Activated Clotting Time: 230 seconds

## 2016-03-02 NOTE — Discharge Summary (Signed)
[facility] Discharge Summary  Meredith Young 1946/07/04 70 y.o. female  [id number]  Admission Date: 02/27/2016  Discharge Date: 02/28/16  Physician: Servando Snare, MD  Admission Diagnosis: Right carotid artery stenosis I65.21  HPI:   This is a 70 y.o. female presented for evaluation of carotid stenosis.  She is known to our office and was last seen by Clemon Chambers, NP for ICA stenosis.  Her last carotid duplex on 09/22/2013 was bilateral carotids < 59% and she was  asymptomatic.  She states she was at the bank July 7th and was feeling dizzy.  Once in the bank, she asked for something to eat thinking her glucose was low due to her known DM.  Then her left arm felt tingly and weak, and she felt her head pounding.  The bank attendant called 911.  She was diagnosed with TIA.  Carotid duplex at [facility] showed right ICA stenosis 79% and CT of the brain was normal.  Her symptoms did subside and she is back to baseline. Past medical history includes HTN managed with amlodipine/valsartan, DM managed with insulin, and hyperlipidemia managed with pravastatin 1/4 40 mg tab.   Hospital Course:  The patient was admitted to the hospital and taken to the operating room on 02/27/2016 and underwent right carotid endarterectomy.  The patient tolerated the procedure well and was transported to the PACU in stable condition.  By POD 1, the patient's neuro status was intact. The patient had a right-sided headache and right earache in the recovery room on day of surgery. He was resolved by postop day 1. Her right neck incision had mild ecchymosis but no hematoma was present. She was tolerating a diet, pain well controlled and ambulating without difficulty. She was discharged home on postop day 1 in good condition.  Discharge Instructions:   The patient is discharged to home with extensive instructions on wound care and progressive ambulation.  They are instructed not to drive or  perform any heavy lifting until returning to see the physician in his office.  Discharge Instructions    CAROTID Sugery: Call MD for difficulty swallowing or speaking; weakness in arms or legs that is a new symtom; severe headache.  If you have increased swelling in the neck and/or  are having difficulty breathing, CALL 911    Complete by:  As directed   Call MD for:  redness, tenderness, or signs of infection (pain, swelling, bleeding, redness, odor or green/yellow discharge around incision site)    Complete by:  As directed   Call MD for:  severe or increased pain, loss or decreased feeling  in affected limb(s)    Complete by:  As directed   Call MD for:  temperature >100.5    Complete by:  As directed   Discharge wound care:    Complete by:  As directed   Wash wounds daily with soap and water and pat dry. Do not apply any creams or ointments on your incisions.   Driving Restrictions    Complete by:  As directed   No driving for 2 weeks   Increase activity slowly    Complete by:  As directed   Walk with assistance use walker or cane as needed   Lifting restrictions    Complete by:  As directed   No lifting for 2 weeks   Resume previous diet    Complete by:  As directed      Discharge Diagnosis:  Right carotid artery stenosis I65.21  Secondary Diagnosis: Patient Active Problem List   Diagnosis Date Noted  . Symptomatic stenosis of right carotid artery 02/27/2016  . Cerebrovascular disease 04/28/2014  . Low back pain 04/28/2014  . History of colonic polyps 04/28/2014  . OA (osteoarthritis) 04/28/2014  . Rosacea 04/28/2014  . GERD (gastroesophageal reflux disease) 04/28/2014  . RLS (restless legs syndrome) 04/28/2014  . Hypothyroidism 04/28/2014  . Depression 04/28/2014  . Anxiety 04/28/2014  . Diabetes ([place]) 04/28/2014  . HTN (hypertension) 04/28/2014  . Pernicious anemia 04/28/2014  . Occlusion and stenosis of carotid artery without mention of cerebral infarction 11/25/2012    Past Medical History:  Diagnosis Date  . Carotid artery occlusion   . Complication of anesthesia   . Diabetes mellitus without complication ([place])   . Diverticulitis of colon   . GERD (gastroesophageal reflux disease)   . Gout   . Gout   . Headache   . History of hiatal hernia   . Hx of colonic polyps   . Hyperlipidemia   . Hypertension    HEART BEATS FAST   . Hypothyroidism   . Idiopathic peripheral neuropathy (HCC)   . Osteoarthritis   . PONV (postoperative nausea and vomiting)   . TIA (transient ischemic attack)    01/2016      Medication List    TAKE these medications   allopurinol 300 MG tablet Commonly known as:  ZYLOPRIM Take 300 mg by mouth daily.   amLODipine-valsartan 5-320 MG tablet Commonly known as:  EXFORGE Take 1 tablet by mouth daily.   aspirin EC 81 MG tablet Take 81 mg by mouth daily.   fexofenadine 180 MG tablet Commonly known as:  ALLEGRA Take 180 mg by mouth daily as needed for allergies or rhinitis.   folic acid 1 MG tablet Commonly known as:  FOLVITE Take 1 mg by mouth 2 (two) times daily.   insulin glargine 100 UNIT/ML injection Commonly known as:  LANTUS Inject 0.7 mLs (70 Units total) into the skin every morning. And syringes 2/day   levothyroxine 112 MCG tablet Commonly known as:  SYNTHROID, LEVOTHROID Take 112 mcg by mouth daily before breakfast.   MULTIVITAMIN PO Take 1 tablet by mouth 2 (two) times daily.   omeprazole 20 MG capsule Commonly known as:  PRILOSEC Take 20 mg by mouth daily.   oxyCODONE-acetaminophen 5-325 MG tablet Commonly known as:  PERCOCET/ROXICET Take 1-2 tablets by mouth every 6 (six) hours as needed for moderate pain.   pravastatin 40 MG tablet Commonly known as:  PRAVACHOL Take 40 mg by mouth daily.   VICTOZA 18 MG/3ML Sopn Generic drug:  Liraglutide Inject 1.8 mg into the skin daily.       Percocet #8 No Refill  Disposition: Home  Patient's condition: is Good  Follow up: 1. Dr.   Donzetta Matters in 2 weeks.   Virgina Jock, PA-C [facility] [phone]  --- For [facility] use --- Instructions: Press F2 to tab through selections.  Delete question if not applicable.   Modified Rankin score at D/C (0-6): 0  IV medication needed for:  1. Hypertension: No 2. Hypotension: No  Post-op Complications: No  1. Post-op CVA or TIA: No  2. CN injury: No  3. Myocardial infarction: No  4.  CHF: No  5.  Dysrhythmia (new): No  6. Wound infection: No  7. Reperfusion symptoms: No  8. Return to OR: No  Discharge medications: Statin use:  Yes If No: [ ]  For Medical reasons, [ ]   Non-compliant, [ ]  Not-indicated ASA use:  Yes  If No: [ ]  For Medical reasons, [ ]  Non-compliant, [ ]  Not-indicated Beta blocker use:  No If No: [ ]  For Medical reasons, [ ]  Non-compliant, [ ]  Not-indicated ACE-Inhibitor use:  No, on ARB If No: [ ]  For Medical reasons, [ ]  Non-compliant, [ ]  Not-indicated P2Y12 Antagonist use: No, [ ]  Plavix, [ ]  Plasugrel, [ ]  Ticlopinine, [ ]  Ticagrelor, [ ]  Other, [ ]  No for medical reason, [ ]  Non-compliant, [ ]  Not-indicated Anti-coagulant use:  No, [ ]  Warfarin, [ ]  Rivaroxaban, [ ]  Dabigatran, [ ]  Other, [ ]  No for medical reason, [ ]  Non-compliant, [ ]  Not-indicated

## 2016-03-13 ENCOUNTER — Encounter: Payer: Self-pay | Admitting: Vascular Surgery

## 2016-03-16 ENCOUNTER — Encounter: Payer: Medicare Other | Admitting: Vascular Surgery

## 2016-03-23 ENCOUNTER — Encounter: Payer: Self-pay | Admitting: Vascular Surgery

## 2016-03-23 ENCOUNTER — Ambulatory Visit (INDEPENDENT_AMBULATORY_CARE_PROVIDER_SITE_OTHER): Payer: Medicare Other | Admitting: Vascular Surgery

## 2016-03-23 VITALS — BP 137/74 | HR 115 | Temp 98.9°F | Resp 16 | Ht 63.0 in | Wt 160.0 lb

## 2016-03-23 DIAGNOSIS — I6521 Occlusion and stenosis of right carotid artery: Secondary | ICD-10-CM

## 2016-03-23 DIAGNOSIS — I739 Peripheral vascular disease, unspecified: Secondary | ICD-10-CM

## 2016-03-23 NOTE — Progress Notes (Signed)
   Subjective:    Patient ID: Meredith Young, female    DOB: [birth year], 70 y.o.   MRN: [id number]  HPI historical follows up from her recent right carotid endarterectomy for symptomatic high-grade carotid artery stenosis. She does have some pain at her operative site but is otherwise doing well without neurologic symptoms. She is hypertensive on presentation today and does have headache. Her main complaint is pain in her lower extremities with walking that appears to be multifactorial.   Review of Systems  Constitutional: Negative.   HENT: Negative.   Eyes: Negative.   Respiratory: Negative.   Cardiovascular: Negative.   Neurological: Negative.   Psychiatric/Behavioral: Negative.        Objective:   Physical Exam  Constitutional: She is oriented to person, place, and time. She appears well-developed.  Neck:  Healing R CEA incision  Pulmonary/Chest: Effort normal.  Abdominal: Soft.  Neurological: She is alert and oriented to person, place, and time.  Skin: Skin is warm and dry.  Psychiatric: She has a normal mood and affect. Her behavior is normal. Judgment and thought content normal.          Assessment & Plan:  70 year old white female who is status post right carotid endarterectomy for symptomatic carotid stenosis. She returns today doing very well having minimal pain at her incision site. Per protocol we will  see her back in 6 months with repeat duplex. At this time we'll also obtain ABIs for leg pain although she has palpable pulses and I expect this will be normal and her pain is multifactorial nature. All of her questions were answered.   Montel Vanderhoof C. Donzetta Matters, MD [facility] [place] Office: [phone] Pager: [phone]

## 2016-04-02 DIAGNOSIS — I350 Nonrheumatic aortic (valve) stenosis: Secondary | ICD-10-CM | POA: Diagnosis not present

## 2016-04-02 DIAGNOSIS — I10 Essential (primary) hypertension: Secondary | ICD-10-CM | POA: Diagnosis not present

## 2016-04-09 DIAGNOSIS — E039 Hypothyroidism, unspecified: Secondary | ICD-10-CM | POA: Diagnosis not present

## 2016-04-09 DIAGNOSIS — I6523 Occlusion and stenosis of bilateral carotid arteries: Secondary | ICD-10-CM | POA: Diagnosis not present

## 2016-04-09 DIAGNOSIS — E785 Hyperlipidemia, unspecified: Secondary | ICD-10-CM | POA: Diagnosis not present

## 2016-04-09 DIAGNOSIS — Z1231 Encounter for screening mammogram for malignant neoplasm of breast: Secondary | ICD-10-CM | POA: Diagnosis not present

## 2016-04-09 DIAGNOSIS — Z6828 Body mass index (BMI) 28.0-28.9, adult: Secondary | ICD-10-CM | POA: Diagnosis not present

## 2016-04-09 DIAGNOSIS — M109 Gout, unspecified: Secondary | ICD-10-CM | POA: Diagnosis not present

## 2016-04-09 DIAGNOSIS — Z79899 Other long term (current) drug therapy: Secondary | ICD-10-CM | POA: Diagnosis not present

## 2016-04-09 DIAGNOSIS — E1140 Type 2 diabetes mellitus with diabetic neuropathy, unspecified: Secondary | ICD-10-CM | POA: Diagnosis not present

## 2016-04-09 DIAGNOSIS — I10 Essential (primary) hypertension: Secondary | ICD-10-CM | POA: Diagnosis not present

## 2016-04-09 DIAGNOSIS — G459 Transient cerebral ischemic attack, unspecified: Secondary | ICD-10-CM | POA: Diagnosis not present

## 2016-04-16 DIAGNOSIS — N390 Urinary tract infection, site not specified: Secondary | ICD-10-CM | POA: Diagnosis not present

## 2016-04-16 DIAGNOSIS — Z6829 Body mass index (BMI) 29.0-29.9, adult: Secondary | ICD-10-CM | POA: Diagnosis not present

## 2016-04-24 DIAGNOSIS — Z1231 Encounter for screening mammogram for malignant neoplasm of breast: Secondary | ICD-10-CM | POA: Diagnosis not present

## 2016-05-07 DIAGNOSIS — Z23 Encounter for immunization: Secondary | ICD-10-CM | POA: Diagnosis not present

## 2016-05-08 DIAGNOSIS — Z6828 Body mass index (BMI) 28.0-28.9, adult: Secondary | ICD-10-CM | POA: Diagnosis not present

## 2016-05-08 DIAGNOSIS — N390 Urinary tract infection, site not specified: Secondary | ICD-10-CM | POA: Diagnosis not present

## 2016-05-31 DIAGNOSIS — H524 Presbyopia: Secondary | ICD-10-CM | POA: Diagnosis not present

## 2016-05-31 DIAGNOSIS — H2513 Age-related nuclear cataract, bilateral: Secondary | ICD-10-CM | POA: Diagnosis not present

## 2016-05-31 DIAGNOSIS — E119 Type 2 diabetes mellitus without complications: Secondary | ICD-10-CM | POA: Diagnosis not present

## 2016-06-14 DIAGNOSIS — Z6828 Body mass index (BMI) 28.0-28.9, adult: Secondary | ICD-10-CM | POA: Diagnosis not present

## 2016-06-14 DIAGNOSIS — J208 Acute bronchitis due to other specified organisms: Secondary | ICD-10-CM | POA: Diagnosis not present

## 2016-06-19 NOTE — Addendum Note (Signed)
Addended by: Lianne Cure A on: 06/19/2016 08:51 AM   Modules accepted: Orders

## 2016-06-27 DIAGNOSIS — Z6828 Body mass index (BMI) 28.0-28.9, adult: Secondary | ICD-10-CM | POA: Diagnosis not present

## 2016-06-27 DIAGNOSIS — E1140 Type 2 diabetes mellitus with diabetic neuropathy, unspecified: Secondary | ICD-10-CM | POA: Diagnosis not present

## 2016-07-26 DIAGNOSIS — M109 Gout, unspecified: Secondary | ICD-10-CM | POA: Diagnosis not present

## 2016-07-26 DIAGNOSIS — G459 Transient cerebral ischemic attack, unspecified: Secondary | ICD-10-CM | POA: Diagnosis not present

## 2016-07-26 DIAGNOSIS — E785 Hyperlipidemia, unspecified: Secondary | ICD-10-CM | POA: Diagnosis not present

## 2016-07-26 DIAGNOSIS — M75101 Unspecified rotator cuff tear or rupture of right shoulder, not specified as traumatic: Secondary | ICD-10-CM | POA: Diagnosis not present

## 2016-07-26 DIAGNOSIS — Z6828 Body mass index (BMI) 28.0-28.9, adult: Secondary | ICD-10-CM | POA: Diagnosis not present

## 2016-07-26 DIAGNOSIS — G8929 Other chronic pain: Secondary | ICD-10-CM

## 2016-07-26 DIAGNOSIS — I6523 Occlusion and stenosis of bilateral carotid arteries: Secondary | ICD-10-CM | POA: Diagnosis not present

## 2016-07-26 DIAGNOSIS — Z23 Encounter for immunization: Secondary | ICD-10-CM | POA: Diagnosis not present

## 2016-07-26 DIAGNOSIS — E1140 Type 2 diabetes mellitus with diabetic neuropathy, unspecified: Secondary | ICD-10-CM | POA: Diagnosis not present

## 2016-07-26 DIAGNOSIS — E039 Hypothyroidism, unspecified: Secondary | ICD-10-CM | POA: Diagnosis not present

## 2016-07-26 DIAGNOSIS — I10 Essential (primary) hypertension: Secondary | ICD-10-CM | POA: Diagnosis not present

## 2016-07-26 DIAGNOSIS — M25511 Pain in right shoulder: Secondary | ICD-10-CM | POA: Diagnosis not present

## 2016-07-26 DIAGNOSIS — M19011 Primary osteoarthritis, right shoulder: Secondary | ICD-10-CM | POA: Diagnosis not present

## 2016-07-26 HISTORY — DX: Other chronic pain: G89.29

## 2016-07-28 DIAGNOSIS — G8929 Other chronic pain: Secondary | ICD-10-CM | POA: Diagnosis not present

## 2016-07-28 DIAGNOSIS — M75101 Unspecified rotator cuff tear or rupture of right shoulder, not specified as traumatic: Secondary | ICD-10-CM | POA: Diagnosis not present

## 2016-07-28 DIAGNOSIS — M25511 Pain in right shoulder: Secondary | ICD-10-CM | POA: Diagnosis not present

## 2016-07-31 DIAGNOSIS — M75121 Complete rotator cuff tear or rupture of right shoulder, not specified as traumatic: Secondary | ICD-10-CM | POA: Diagnosis not present

## 2016-08-14 DIAGNOSIS — E785 Hyperlipidemia, unspecified: Secondary | ICD-10-CM | POA: Diagnosis not present

## 2016-08-14 DIAGNOSIS — M109 Gout, unspecified: Secondary | ICD-10-CM | POA: Diagnosis not present

## 2016-08-14 DIAGNOSIS — R112 Nausea with vomiting, unspecified: Secondary | ICD-10-CM | POA: Diagnosis not present

## 2016-08-14 DIAGNOSIS — R1031 Right lower quadrant pain: Secondary | ICD-10-CM | POA: Diagnosis not present

## 2016-08-14 DIAGNOSIS — R1032 Left lower quadrant pain: Secondary | ICD-10-CM | POA: Diagnosis not present

## 2016-08-14 DIAGNOSIS — Z79899 Other long term (current) drug therapy: Secondary | ICD-10-CM | POA: Diagnosis not present

## 2016-08-14 DIAGNOSIS — R42 Dizziness and giddiness: Secondary | ICD-10-CM | POA: Diagnosis not present

## 2016-08-14 DIAGNOSIS — G629 Polyneuropathy, unspecified: Secondary | ICD-10-CM | POA: Diagnosis not present

## 2016-08-14 DIAGNOSIS — E669 Obesity, unspecified: Secondary | ICD-10-CM | POA: Diagnosis not present

## 2016-08-14 DIAGNOSIS — E860 Dehydration: Secondary | ICD-10-CM | POA: Diagnosis not present

## 2016-08-14 DIAGNOSIS — E039 Hypothyroidism, unspecified: Secondary | ICD-10-CM | POA: Diagnosis not present

## 2016-08-14 DIAGNOSIS — R197 Diarrhea, unspecified: Secondary | ICD-10-CM | POA: Diagnosis not present

## 2016-08-14 DIAGNOSIS — R55 Syncope and collapse: Secondary | ICD-10-CM | POA: Diagnosis not present

## 2016-08-14 DIAGNOSIS — N179 Acute kidney failure, unspecified: Secondary | ICD-10-CM | POA: Diagnosis not present

## 2016-08-14 DIAGNOSIS — E1169 Type 2 diabetes mellitus with other specified complication: Secondary | ICD-10-CM | POA: Diagnosis not present

## 2016-08-14 DIAGNOSIS — I10 Essential (primary) hypertension: Secondary | ICD-10-CM | POA: Diagnosis not present

## 2016-08-14 DIAGNOSIS — K219 Gastro-esophageal reflux disease without esophagitis: Secondary | ICD-10-CM | POA: Diagnosis not present

## 2016-08-14 DIAGNOSIS — Z9181 History of falling: Secondary | ICD-10-CM | POA: Diagnosis not present

## 2016-08-14 DIAGNOSIS — R109 Unspecified abdominal pain: Secondary | ICD-10-CM | POA: Diagnosis not present

## 2016-08-15 DIAGNOSIS — E1169 Type 2 diabetes mellitus with other specified complication: Secondary | ICD-10-CM

## 2016-08-15 DIAGNOSIS — I10 Essential (primary) hypertension: Secondary | ICD-10-CM

## 2016-08-15 DIAGNOSIS — E860 Dehydration: Secondary | ICD-10-CM | POA: Diagnosis not present

## 2016-08-15 DIAGNOSIS — N179 Acute kidney failure, unspecified: Secondary | ICD-10-CM | POA: Diagnosis not present

## 2016-08-15 DIAGNOSIS — E786 Lipoprotein deficiency: Secondary | ICD-10-CM

## 2016-08-15 DIAGNOSIS — R112 Nausea with vomiting, unspecified: Secondary | ICD-10-CM | POA: Diagnosis not present

## 2016-08-15 DIAGNOSIS — G629 Polyneuropathy, unspecified: Secondary | ICD-10-CM

## 2016-08-15 DIAGNOSIS — K219 Gastro-esophageal reflux disease without esophagitis: Secondary | ICD-10-CM

## 2016-08-15 DIAGNOSIS — R55 Syncope and collapse: Secondary | ICD-10-CM

## 2016-08-15 DIAGNOSIS — E039 Hypothyroidism, unspecified: Secondary | ICD-10-CM

## 2016-08-15 DIAGNOSIS — M109 Gout, unspecified: Secondary | ICD-10-CM

## 2016-08-16 DIAGNOSIS — E860 Dehydration: Secondary | ICD-10-CM | POA: Diagnosis not present

## 2016-08-16 DIAGNOSIS — R112 Nausea with vomiting, unspecified: Secondary | ICD-10-CM | POA: Diagnosis not present

## 2016-08-16 DIAGNOSIS — R55 Syncope and collapse: Secondary | ICD-10-CM | POA: Diagnosis not present

## 2016-08-16 DIAGNOSIS — N179 Acute kidney failure, unspecified: Secondary | ICD-10-CM | POA: Diagnosis not present

## 2016-08-23 DIAGNOSIS — Z79899 Other long term (current) drug therapy: Secondary | ICD-10-CM | POA: Diagnosis not present

## 2016-08-23 DIAGNOSIS — A084 Viral intestinal infection, unspecified: Secondary | ICD-10-CM | POA: Diagnosis not present

## 2016-08-23 DIAGNOSIS — K5792 Diverticulitis of intestine, part unspecified, without perforation or abscess without bleeding: Secondary | ICD-10-CM | POA: Diagnosis not present

## 2016-08-23 DIAGNOSIS — Z6828 Body mass index (BMI) 28.0-28.9, adult: Secondary | ICD-10-CM | POA: Diagnosis not present

## 2016-08-23 DIAGNOSIS — R6889 Other general symptoms and signs: Secondary | ICD-10-CM | POA: Diagnosis not present

## 2016-08-23 DIAGNOSIS — J019 Acute sinusitis, unspecified: Secondary | ICD-10-CM | POA: Diagnosis not present

## 2016-09-12 DIAGNOSIS — M7541 Impingement syndrome of right shoulder: Secondary | ICD-10-CM | POA: Diagnosis not present

## 2016-09-12 DIAGNOSIS — M19011 Primary osteoarthritis, right shoulder: Secondary | ICD-10-CM | POA: Diagnosis not present

## 2016-09-12 DIAGNOSIS — M94211 Chondromalacia, right shoulder: Secondary | ICD-10-CM | POA: Diagnosis not present

## 2016-09-12 DIAGNOSIS — M75121 Complete rotator cuff tear or rupture of right shoulder, not specified as traumatic: Secondary | ICD-10-CM | POA: Diagnosis not present

## 2016-09-12 DIAGNOSIS — G8918 Other acute postprocedural pain: Secondary | ICD-10-CM | POA: Diagnosis not present

## 2016-09-12 DIAGNOSIS — M24111 Other articular cartilage disorders, right shoulder: Secondary | ICD-10-CM | POA: Diagnosis not present

## 2016-09-12 DIAGNOSIS — M75111 Incomplete rotator cuff tear or rupture of right shoulder, not specified as traumatic: Secondary | ICD-10-CM | POA: Diagnosis not present

## 2016-09-17 DIAGNOSIS — Z9889 Other specified postprocedural states: Secondary | ICD-10-CM

## 2016-09-17 HISTORY — DX: Other specified postprocedural states: Z98.890

## 2016-09-18 DIAGNOSIS — M62521 Muscle wasting and atrophy, not elsewhere classified, right upper arm: Secondary | ICD-10-CM | POA: Diagnosis not present

## 2016-09-18 DIAGNOSIS — M79621 Pain in right upper arm: Secondary | ICD-10-CM | POA: Diagnosis not present

## 2016-09-18 DIAGNOSIS — M75101 Unspecified rotator cuff tear or rupture of right shoulder, not specified as traumatic: Secondary | ICD-10-CM | POA: Diagnosis not present

## 2016-09-20 DIAGNOSIS — M62521 Muscle wasting and atrophy, not elsewhere classified, right upper arm: Secondary | ICD-10-CM | POA: Diagnosis not present

## 2016-09-20 DIAGNOSIS — M75101 Unspecified rotator cuff tear or rupture of right shoulder, not specified as traumatic: Secondary | ICD-10-CM | POA: Diagnosis not present

## 2016-09-20 DIAGNOSIS — M79621 Pain in right upper arm: Secondary | ICD-10-CM | POA: Diagnosis not present

## 2016-09-24 DIAGNOSIS — M62521 Muscle wasting and atrophy, not elsewhere classified, right upper arm: Secondary | ICD-10-CM | POA: Diagnosis not present

## 2016-09-24 DIAGNOSIS — M79621 Pain in right upper arm: Secondary | ICD-10-CM | POA: Diagnosis not present

## 2016-09-24 DIAGNOSIS — M75101 Unspecified rotator cuff tear or rupture of right shoulder, not specified as traumatic: Secondary | ICD-10-CM | POA: Diagnosis not present

## 2016-09-26 DIAGNOSIS — M79621 Pain in right upper arm: Secondary | ICD-10-CM | POA: Diagnosis not present

## 2016-09-26 DIAGNOSIS — M75101 Unspecified rotator cuff tear or rupture of right shoulder, not specified as traumatic: Secondary | ICD-10-CM | POA: Diagnosis not present

## 2016-09-26 DIAGNOSIS — M62521 Muscle wasting and atrophy, not elsewhere classified, right upper arm: Secondary | ICD-10-CM | POA: Diagnosis not present

## 2016-09-28 ENCOUNTER — Ambulatory Visit: Payer: Medicare Other | Admitting: Vascular Surgery

## 2016-09-28 ENCOUNTER — Encounter (HOSPITAL_COMMUNITY): Payer: Medicare Other

## 2016-09-28 DIAGNOSIS — M62521 Muscle wasting and atrophy, not elsewhere classified, right upper arm: Secondary | ICD-10-CM | POA: Diagnosis not present

## 2016-09-28 DIAGNOSIS — M79621 Pain in right upper arm: Secondary | ICD-10-CM | POA: Diagnosis not present

## 2016-09-28 DIAGNOSIS — M75101 Unspecified rotator cuff tear or rupture of right shoulder, not specified as traumatic: Secondary | ICD-10-CM | POA: Diagnosis not present

## 2016-10-01 DIAGNOSIS — M79621 Pain in right upper arm: Secondary | ICD-10-CM | POA: Diagnosis not present

## 2016-10-01 DIAGNOSIS — M75101 Unspecified rotator cuff tear or rupture of right shoulder, not specified as traumatic: Secondary | ICD-10-CM | POA: Diagnosis not present

## 2016-10-01 DIAGNOSIS — M62521 Muscle wasting and atrophy, not elsewhere classified, right upper arm: Secondary | ICD-10-CM | POA: Diagnosis not present

## 2016-10-03 DIAGNOSIS — M75101 Unspecified rotator cuff tear or rupture of right shoulder, not specified as traumatic: Secondary | ICD-10-CM | POA: Diagnosis not present

## 2016-10-03 DIAGNOSIS — M62521 Muscle wasting and atrophy, not elsewhere classified, right upper arm: Secondary | ICD-10-CM | POA: Diagnosis not present

## 2016-10-03 DIAGNOSIS — M79621 Pain in right upper arm: Secondary | ICD-10-CM | POA: Diagnosis not present

## 2016-10-05 DIAGNOSIS — M75101 Unspecified rotator cuff tear or rupture of right shoulder, not specified as traumatic: Secondary | ICD-10-CM | POA: Diagnosis not present

## 2016-10-05 DIAGNOSIS — M79621 Pain in right upper arm: Secondary | ICD-10-CM | POA: Diagnosis not present

## 2016-10-05 DIAGNOSIS — M62521 Muscle wasting and atrophy, not elsewhere classified, right upper arm: Secondary | ICD-10-CM | POA: Diagnosis not present

## 2016-10-08 DIAGNOSIS — M75101 Unspecified rotator cuff tear or rupture of right shoulder, not specified as traumatic: Secondary | ICD-10-CM | POA: Diagnosis not present

## 2016-10-08 DIAGNOSIS — M62521 Muscle wasting and atrophy, not elsewhere classified, right upper arm: Secondary | ICD-10-CM | POA: Diagnosis not present

## 2016-10-08 DIAGNOSIS — M79621 Pain in right upper arm: Secondary | ICD-10-CM | POA: Diagnosis not present

## 2016-10-10 DIAGNOSIS — M62521 Muscle wasting and atrophy, not elsewhere classified, right upper arm: Secondary | ICD-10-CM | POA: Diagnosis not present

## 2016-10-10 DIAGNOSIS — M79621 Pain in right upper arm: Secondary | ICD-10-CM | POA: Diagnosis not present

## 2016-10-10 DIAGNOSIS — M75101 Unspecified rotator cuff tear or rupture of right shoulder, not specified as traumatic: Secondary | ICD-10-CM | POA: Diagnosis not present

## 2016-10-11 DIAGNOSIS — M62521 Muscle wasting and atrophy, not elsewhere classified, right upper arm: Secondary | ICD-10-CM | POA: Diagnosis not present

## 2016-10-11 DIAGNOSIS — M75101 Unspecified rotator cuff tear or rupture of right shoulder, not specified as traumatic: Secondary | ICD-10-CM | POA: Diagnosis not present

## 2016-10-11 DIAGNOSIS — M79621 Pain in right upper arm: Secondary | ICD-10-CM | POA: Diagnosis not present

## 2016-10-15 DIAGNOSIS — M62521 Muscle wasting and atrophy, not elsewhere classified, right upper arm: Secondary | ICD-10-CM | POA: Diagnosis not present

## 2016-10-15 DIAGNOSIS — M79621 Pain in right upper arm: Secondary | ICD-10-CM | POA: Diagnosis not present

## 2016-10-15 DIAGNOSIS — M75101 Unspecified rotator cuff tear or rupture of right shoulder, not specified as traumatic: Secondary | ICD-10-CM | POA: Diagnosis not present

## 2016-10-18 DIAGNOSIS — M62521 Muscle wasting and atrophy, not elsewhere classified, right upper arm: Secondary | ICD-10-CM | POA: Diagnosis not present

## 2016-10-18 DIAGNOSIS — M79621 Pain in right upper arm: Secondary | ICD-10-CM | POA: Diagnosis not present

## 2016-10-18 DIAGNOSIS — M75101 Unspecified rotator cuff tear or rupture of right shoulder, not specified as traumatic: Secondary | ICD-10-CM | POA: Diagnosis not present

## 2016-10-23 DIAGNOSIS — M75101 Unspecified rotator cuff tear or rupture of right shoulder, not specified as traumatic: Secondary | ICD-10-CM | POA: Diagnosis not present

## 2016-10-23 DIAGNOSIS — M62521 Muscle wasting and atrophy, not elsewhere classified, right upper arm: Secondary | ICD-10-CM | POA: Diagnosis not present

## 2016-10-23 DIAGNOSIS — M79621 Pain in right upper arm: Secondary | ICD-10-CM | POA: Diagnosis not present

## 2016-10-25 DIAGNOSIS — E785 Hyperlipidemia, unspecified: Secondary | ICD-10-CM | POA: Diagnosis not present

## 2016-10-25 DIAGNOSIS — M79621 Pain in right upper arm: Secondary | ICD-10-CM | POA: Diagnosis not present

## 2016-10-25 DIAGNOSIS — G459 Transient cerebral ischemic attack, unspecified: Secondary | ICD-10-CM | POA: Diagnosis not present

## 2016-10-25 DIAGNOSIS — Z9181 History of falling: Secondary | ICD-10-CM | POA: Diagnosis not present

## 2016-10-25 DIAGNOSIS — E039 Hypothyroidism, unspecified: Secondary | ICD-10-CM | POA: Diagnosis not present

## 2016-10-25 DIAGNOSIS — M62521 Muscle wasting and atrophy, not elsewhere classified, right upper arm: Secondary | ICD-10-CM | POA: Diagnosis not present

## 2016-10-25 DIAGNOSIS — I10 Essential (primary) hypertension: Secondary | ICD-10-CM | POA: Diagnosis not present

## 2016-10-25 DIAGNOSIS — M109 Gout, unspecified: Secondary | ICD-10-CM | POA: Diagnosis not present

## 2016-10-25 DIAGNOSIS — M75101 Unspecified rotator cuff tear or rupture of right shoulder, not specified as traumatic: Secondary | ICD-10-CM | POA: Diagnosis not present

## 2016-10-25 DIAGNOSIS — R399 Unspecified symptoms and signs involving the genitourinary system: Secondary | ICD-10-CM | POA: Diagnosis not present

## 2016-10-25 DIAGNOSIS — Z6828 Body mass index (BMI) 28.0-28.9, adult: Secondary | ICD-10-CM | POA: Diagnosis not present

## 2016-10-25 DIAGNOSIS — E1140 Type 2 diabetes mellitus with diabetic neuropathy, unspecified: Secondary | ICD-10-CM | POA: Diagnosis not present

## 2016-10-31 DIAGNOSIS — M75101 Unspecified rotator cuff tear or rupture of right shoulder, not specified as traumatic: Secondary | ICD-10-CM | POA: Diagnosis not present

## 2016-10-31 DIAGNOSIS — M62521 Muscle wasting and atrophy, not elsewhere classified, right upper arm: Secondary | ICD-10-CM | POA: Diagnosis not present

## 2016-10-31 DIAGNOSIS — M79621 Pain in right upper arm: Secondary | ICD-10-CM | POA: Diagnosis not present

## 2016-11-02 DIAGNOSIS — Z6828 Body mass index (BMI) 28.0-28.9, adult: Secondary | ICD-10-CM | POA: Diagnosis not present

## 2016-11-02 DIAGNOSIS — I10 Essential (primary) hypertension: Secondary | ICD-10-CM | POA: Diagnosis not present

## 2016-11-02 DIAGNOSIS — M79621 Pain in right upper arm: Secondary | ICD-10-CM | POA: Diagnosis not present

## 2016-11-02 DIAGNOSIS — I951 Orthostatic hypotension: Secondary | ICD-10-CM | POA: Diagnosis not present

## 2016-11-02 DIAGNOSIS — M75101 Unspecified rotator cuff tear or rupture of right shoulder, not specified as traumatic: Secondary | ICD-10-CM | POA: Diagnosis not present

## 2016-11-02 DIAGNOSIS — J019 Acute sinusitis, unspecified: Secondary | ICD-10-CM | POA: Diagnosis not present

## 2016-11-02 DIAGNOSIS — M62521 Muscle wasting and atrophy, not elsewhere classified, right upper arm: Secondary | ICD-10-CM | POA: Diagnosis not present

## 2016-11-05 DIAGNOSIS — M62521 Muscle wasting and atrophy, not elsewhere classified, right upper arm: Secondary | ICD-10-CM | POA: Diagnosis not present

## 2016-11-05 DIAGNOSIS — M79621 Pain in right upper arm: Secondary | ICD-10-CM | POA: Diagnosis not present

## 2016-11-05 DIAGNOSIS — M75101 Unspecified rotator cuff tear or rupture of right shoulder, not specified as traumatic: Secondary | ICD-10-CM | POA: Diagnosis not present

## 2016-11-07 ENCOUNTER — Encounter: Payer: Self-pay | Admitting: Vascular Surgery

## 2016-11-08 DIAGNOSIS — M79621 Pain in right upper arm: Secondary | ICD-10-CM | POA: Diagnosis not present

## 2016-11-08 DIAGNOSIS — M62521 Muscle wasting and atrophy, not elsewhere classified, right upper arm: Secondary | ICD-10-CM | POA: Diagnosis not present

## 2016-11-08 DIAGNOSIS — M75101 Unspecified rotator cuff tear or rupture of right shoulder, not specified as traumatic: Secondary | ICD-10-CM | POA: Diagnosis not present

## 2016-11-12 DIAGNOSIS — M75101 Unspecified rotator cuff tear or rupture of right shoulder, not specified as traumatic: Secondary | ICD-10-CM | POA: Diagnosis not present

## 2016-11-12 DIAGNOSIS — M62521 Muscle wasting and atrophy, not elsewhere classified, right upper arm: Secondary | ICD-10-CM | POA: Diagnosis not present

## 2016-11-12 DIAGNOSIS — M79621 Pain in right upper arm: Secondary | ICD-10-CM | POA: Diagnosis not present

## 2016-11-15 DIAGNOSIS — M75101 Unspecified rotator cuff tear or rupture of right shoulder, not specified as traumatic: Secondary | ICD-10-CM | POA: Diagnosis not present

## 2016-11-15 DIAGNOSIS — M62521 Muscle wasting and atrophy, not elsewhere classified, right upper arm: Secondary | ICD-10-CM | POA: Diagnosis not present

## 2016-11-15 DIAGNOSIS — M79621 Pain in right upper arm: Secondary | ICD-10-CM | POA: Diagnosis not present

## 2016-11-16 ENCOUNTER — Ambulatory Visit (HOSPITAL_COMMUNITY)
Admission: RE | Admit: 2016-11-16 | Discharge: 2016-11-16 | Disposition: A | Discharge status: Home / Self Care | Payer: Medicare Other | Source: Ambulatory Visit | Attending: Vascular Surgery | Admitting: Vascular Surgery

## 2016-11-16 ENCOUNTER — Encounter: Payer: Self-pay | Admitting: Vascular Surgery

## 2016-11-16 ENCOUNTER — Ambulatory Visit (INDEPENDENT_AMBULATORY_CARE_PROVIDER_SITE_OTHER): Payer: Medicare Other | Admitting: Physician Assistant

## 2016-11-16 VITALS — BP 150/77 | HR 98 | Temp 98.1°F | Resp 16 | Ht 63.0 in | Wt 162.0 lb

## 2016-11-16 DIAGNOSIS — I6521 Occlusion and stenosis of right carotid artery: Secondary | ICD-10-CM | POA: Diagnosis not present

## 2016-11-16 DIAGNOSIS — I739 Peripheral vascular disease, unspecified: Secondary | ICD-10-CM | POA: Diagnosis not present

## 2016-11-16 DIAGNOSIS — R9439 Abnormal result of other cardiovascular function study: Secondary | ICD-10-CM | POA: Diagnosis not present

## 2016-11-16 LAB — VAS US CAROTID
LCCADDIAS: -19 cm/s
LEFT ECA DIAS: 24 cm/s
LICAPDIAS: -37 cm/s
LICAPSYS: -126 cm/s
Left CCA dist sys: -90 cm/s
Left CCA prox dias: 22 cm/s
Left CCA prox sys: 89 cm/s
Left ICA dist dias: 21 cm/s
Left ICA dist sys: 57 cm/s
RCCADSYS: -94 cm/s
RCCAPSYS: 159 cm/s
RIGHT CCA MID DIAS: 19 cm/s
RIGHT ECA DIAS: 15 cm/s
Right CCA prox dias: 31 cm/s

## 2016-11-16 NOTE — Progress Notes (Signed)
History of Present Illness: 71 y/o female here for a follow up visit s/p right CEA now 6 months postt op.  She is very pleased with her out come and reports no problems with vision, PO intake or weakness in her extremities.   On her last visit she did express calf pain with walking and that if she stopped and rested she could continue to walk.  She states it is not inter fearing with her daily activities.  She has no history of non healing wounds on her feet.  She continues to work part time.   Past Medical History:  Diagnosis Date  . Carotid artery occlusion   . Complication of anesthesia   . Diabetes mellitus without complication ([place])   . Diverticulitis of colon   . GERD (gastroesophageal reflux disease)   . Gout   . Gout   . Headache   . History of hiatal hernia   . Hx of colonic polyps   . Hyperlipidemia   . Hypertension    HEART BEATS FAST   . Hypothyroidism   . Idiopathic peripheral neuropathy   . Osteoarthritis   . PONV (postoperative nausea and vomiting)   . TIA (transient ischemic attack)    01/2016    Past Surgical History:  Procedure Laterality Date  . ABDOMINAL HYSTERECTOMY    . ANKLE SURGERY     LEFT  . APPENDECTOMY    . CHOLECYSTECTOMY  1991  . ENDARTERECTOMY Right 02/27/2016   Procedure: RIGHT ENDARTERECTOMY CAROTID;  Surgeon: Waynetta Sandy, MD;  Location: [place];  Service: Vascular;  Laterality: Right;  . NASAL SINUS SURGERY  1975  . PATCH ANGIOPLASTY Right 02/27/2016   Procedure: PATCH ANGIOPLASTY USING Rueben Bash BIOLOGIC PATCH;  Surgeon: Waynetta Sandy, MD;  Location: [place];  Service: Vascular;  Laterality: Right;  . POLYPECTOMY  1985     Social History Social History  Substance Use Topics  . Smoking status: Former Smoker    Start date: 07/16/1988  . Smokeless tobacco: Never Used  . Alcohol use No    Family History Family History  Problem Relation Age of Onset  . Hypertension Mother   . Stroke Father   . Diabetes Father    . Heart disease Father     before age 38  . Hyperlipidemia Father   . Hypertension Father   . Other Father     amputation of great toe  . Diabetes Sister     Allergies  Allergies  Allergen Reactions  . Statins Other (See Comments)    Leg aches, Myalgias   . Codeine Nausea And Vomiting  . Demerol [Meperidine] Nausea And Vomiting  . Diltiazem Other (See Comments)    UNSPECIFIED , Unknown  . Metformin And Related Other (See Comments)    UNSPECIFIED      Current Outpatient Prescriptions  Medication Sig Dispense Refill  . allopurinol (ZYLOPRIM) 300 MG tablet Take 300 mg by mouth daily.    Marland Kitchen amLODipine-valsartan (EXFORGE) 5-320 MG per tablet Take 1 tablet by mouth daily.    Marland Kitchen aspirin EC 81 MG tablet Take 81 mg by mouth daily.    . fexofenadine (ALLEGRA) 180 MG tablet Take 180 mg by mouth daily as needed for allergies or rhinitis.    . folic acid (FOLVITE) 1 MG tablet Take 1 mg by mouth 2 (two) times daily.     . insulin glargine (LANTUS) 100 UNIT/ML injection Inject 0.7 mLs (70 Units total) into the skin  every morning. And syringes 2/day 30 mL 11  . levothyroxine (SYNTHROID, LEVOTHROID) 112 MCG tablet Take 112 mcg by mouth daily before breakfast.    . Liraglutide (VICTOZA) 18 MG/3ML SOPN Inject 1.8 mg into the skin daily.    . Multiple Vitamins-Minerals (MULTIVITAMIN PO) Take 1 tablet by mouth 2 (two) times daily.    Marland Kitchen omeprazole (PRILOSEC) 20 MG capsule Take 20 mg by mouth daily.    Marland Kitchen oxyCODONE-acetaminophen (PERCOCET) 5-325 mg TABS tablet Take by mouth.    . pravastatin (PRAVACHOL) 40 MG tablet Take 40 mg by mouth daily.     No current facility-administered medications for this visit.     ROS:   General:  No weight loss, Fever, chills  HEENT: No recent headaches, no nasal bleeding, no visual changes, no sore throat  Neurologic: No dizziness, blackouts, seizures. No recent symptoms of stroke or mini- stroke. No recent episodes of slurred speech, or temporary  blindness.  Cardiac: No recent episodes of chest pain/pressure, no shortness of breath at rest.  No shortness of breath with exertion.  Denies history of atrial fibrillation or irregular heartbeat  Vascular: No history of rest pain in feet.  Positive symptoms claudication.  No history of non-healing ulcer, No history of DVT   Pulmonary: No home oxygen, no productive cough, no hemoptysis,  No asthma or wheezing  Musculoskeletal:  [ ]  Arthritis, [ ]  Low back pain,  [x ] Joint pain  Hematologic:No history of hypercoagulable state.  No history of easy bleeding.  No history of anemia  Gastrointestinal: No hematochezia or melena,  No gastroesophageal reflux, no trouble swallowing  Urinary: [ ]  chronic Kidney disease, [ ]  on HD - [ ]  MWF or [ ]  TTHS, [ ]  Burning with urination, [ ]  Frequent urination, [ ]  Difficulty urinating;   Skin: No rashes  Psychological: No history of anxiety,  No history of depression   Physical Examination  Vitals:   11/16/16 1536 11/16/16 1539  BP: 127/78 (!) 150/77  Pulse: 98   Resp: 16   Temp: 98.1 F (36.7 C)   TempSrc: Oral   SpO2: 98%   Weight: 162 lb (73.5 kg)   Height: 5\' 3"  (1.6 m)     Body mass index is 28.7 kg/m.  General:  Alert and oriented, no acute distress HEENT: Normal Neck: No bruit or JVD, well healed right carotid neck scar  Pulmonary: Clear to auscultation bilaterally Cardiac: Regular Rate and Rhythm without murmur Gastrointestinal: Soft, non-tender, non-distended, no mass Skin: No rash Extremity Pulses:  2+ radial, brachial, femoral, popliteal, right dorsalis pedis, posterior tibial pulses bilaterally.  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  She will follow up in 6 month for a repeat duplex.   She does claudication in the left LE, but it does interfere with her quality of life.  We want her to start a walking program.  Unless she develops symptoms that interfere with her quality of life or she develops a non healing wound we will f/u on her left  LE as needed.  She is in agreement with this plan.  She is currently taking aspirin and a statin daily.       Theda Sers, Tomi Paddock [facility] PA-C [facility] [place]  MD in clinic is Dr. Donzetta Matters

## 2016-11-20 DIAGNOSIS — M62521 Muscle wasting and atrophy, not elsewhere classified, right upper arm: Secondary | ICD-10-CM | POA: Diagnosis not present

## 2016-11-20 DIAGNOSIS — M79621 Pain in right upper arm: Secondary | ICD-10-CM | POA: Diagnosis not present

## 2016-11-20 DIAGNOSIS — M75101 Unspecified rotator cuff tear or rupture of right shoulder, not specified as traumatic: Secondary | ICD-10-CM | POA: Diagnosis not present

## 2016-11-20 NOTE — Addendum Note (Signed)
Addended by: Lianne Cure A on: 11/20/2016 09:25 AM   Modules accepted: Orders

## 2016-11-22 DIAGNOSIS — M75101 Unspecified rotator cuff tear or rupture of right shoulder, not specified as traumatic: Secondary | ICD-10-CM | POA: Diagnosis not present

## 2016-11-22 DIAGNOSIS — M79621 Pain in right upper arm: Secondary | ICD-10-CM | POA: Diagnosis not present

## 2016-11-22 DIAGNOSIS — M62521 Muscle wasting and atrophy, not elsewhere classified, right upper arm: Secondary | ICD-10-CM | POA: Diagnosis not present

## 2016-11-26 DIAGNOSIS — M79621 Pain in right upper arm: Secondary | ICD-10-CM | POA: Diagnosis not present

## 2016-11-26 DIAGNOSIS — M75101 Unspecified rotator cuff tear or rupture of right shoulder, not specified as traumatic: Secondary | ICD-10-CM | POA: Diagnosis not present

## 2016-11-26 DIAGNOSIS — M62521 Muscle wasting and atrophy, not elsewhere classified, right upper arm: Secondary | ICD-10-CM | POA: Diagnosis not present

## 2016-11-29 DIAGNOSIS — M62521 Muscle wasting and atrophy, not elsewhere classified, right upper arm: Secondary | ICD-10-CM | POA: Diagnosis not present

## 2016-11-29 DIAGNOSIS — M79621 Pain in right upper arm: Secondary | ICD-10-CM | POA: Diagnosis not present

## 2016-11-29 DIAGNOSIS — M75101 Unspecified rotator cuff tear or rupture of right shoulder, not specified as traumatic: Secondary | ICD-10-CM | POA: Diagnosis not present

## 2016-12-04 DIAGNOSIS — M62521 Muscle wasting and atrophy, not elsewhere classified, right upper arm: Secondary | ICD-10-CM | POA: Diagnosis not present

## 2016-12-04 DIAGNOSIS — M79621 Pain in right upper arm: Secondary | ICD-10-CM | POA: Diagnosis not present

## 2016-12-04 DIAGNOSIS — M75101 Unspecified rotator cuff tear or rupture of right shoulder, not specified as traumatic: Secondary | ICD-10-CM | POA: Diagnosis not present

## 2016-12-06 DIAGNOSIS — M79621 Pain in right upper arm: Secondary | ICD-10-CM | POA: Diagnosis not present

## 2016-12-06 DIAGNOSIS — M62521 Muscle wasting and atrophy, not elsewhere classified, right upper arm: Secondary | ICD-10-CM | POA: Diagnosis not present

## 2016-12-06 DIAGNOSIS — M75101 Unspecified rotator cuff tear or rupture of right shoulder, not specified as traumatic: Secondary | ICD-10-CM | POA: Diagnosis not present

## 2016-12-11 DIAGNOSIS — M79621 Pain in right upper arm: Secondary | ICD-10-CM | POA: Diagnosis not present

## 2016-12-11 DIAGNOSIS — M62521 Muscle wasting and atrophy, not elsewhere classified, right upper arm: Secondary | ICD-10-CM | POA: Diagnosis not present

## 2016-12-11 DIAGNOSIS — M75101 Unspecified rotator cuff tear or rupture of right shoulder, not specified as traumatic: Secondary | ICD-10-CM | POA: Diagnosis not present

## 2016-12-14 DIAGNOSIS — M79621 Pain in right upper arm: Secondary | ICD-10-CM | POA: Diagnosis not present

## 2016-12-14 DIAGNOSIS — M62521 Muscle wasting and atrophy, not elsewhere classified, right upper arm: Secondary | ICD-10-CM | POA: Diagnosis not present

## 2016-12-14 DIAGNOSIS — M75101 Unspecified rotator cuff tear or rupture of right shoulder, not specified as traumatic: Secondary | ICD-10-CM | POA: Diagnosis not present

## 2016-12-18 DIAGNOSIS — M62521 Muscle wasting and atrophy, not elsewhere classified, right upper arm: Secondary | ICD-10-CM | POA: Diagnosis not present

## 2016-12-18 DIAGNOSIS — M79621 Pain in right upper arm: Secondary | ICD-10-CM | POA: Diagnosis not present

## 2016-12-18 DIAGNOSIS — M75101 Unspecified rotator cuff tear or rupture of right shoulder, not specified as traumatic: Secondary | ICD-10-CM | POA: Diagnosis not present

## 2016-12-21 DIAGNOSIS — M79621 Pain in right upper arm: Secondary | ICD-10-CM | POA: Diagnosis not present

## 2016-12-21 DIAGNOSIS — M62521 Muscle wasting and atrophy, not elsewhere classified, right upper arm: Secondary | ICD-10-CM | POA: Diagnosis not present

## 2016-12-21 DIAGNOSIS — M75101 Unspecified rotator cuff tear or rupture of right shoulder, not specified as traumatic: Secondary | ICD-10-CM | POA: Diagnosis not present

## 2017-01-01 DIAGNOSIS — M75101 Unspecified rotator cuff tear or rupture of right shoulder, not specified as traumatic: Secondary | ICD-10-CM | POA: Diagnosis not present

## 2017-01-01 DIAGNOSIS — M62521 Muscle wasting and atrophy, not elsewhere classified, right upper arm: Secondary | ICD-10-CM | POA: Diagnosis not present

## 2017-01-01 DIAGNOSIS — M79621 Pain in right upper arm: Secondary | ICD-10-CM | POA: Diagnosis not present

## 2017-01-02 DIAGNOSIS — L600 Ingrowing nail: Secondary | ICD-10-CM | POA: Diagnosis not present

## 2017-01-02 DIAGNOSIS — Z6829 Body mass index (BMI) 29.0-29.9, adult: Secondary | ICD-10-CM | POA: Diagnosis not present

## 2017-01-03 DIAGNOSIS — L600 Ingrowing nail: Secondary | ICD-10-CM | POA: Diagnosis not present

## 2017-01-03 DIAGNOSIS — E119 Type 2 diabetes mellitus without complications: Secondary | ICD-10-CM | POA: Diagnosis not present

## 2017-01-03 DIAGNOSIS — L03031 Cellulitis of right toe: Secondary | ICD-10-CM | POA: Diagnosis not present

## 2017-01-04 DIAGNOSIS — L600 Ingrowing nail: Secondary | ICD-10-CM | POA: Insufficient documentation

## 2017-01-04 DIAGNOSIS — L03031 Cellulitis of right toe: Secondary | ICD-10-CM | POA: Insufficient documentation

## 2017-01-04 HISTORY — DX: Cellulitis of right toe: L03.031

## 2017-01-04 HISTORY — DX: Ingrowing nail: L60.0

## 2017-01-14 DIAGNOSIS — R300 Dysuria: Secondary | ICD-10-CM | POA: Diagnosis not present

## 2017-01-14 DIAGNOSIS — N390 Urinary tract infection, site not specified: Secondary | ICD-10-CM | POA: Diagnosis not present

## 2017-01-14 DIAGNOSIS — Z6829 Body mass index (BMI) 29.0-29.9, adult: Secondary | ICD-10-CM | POA: Diagnosis not present

## 2017-01-14 DIAGNOSIS — G629 Polyneuropathy, unspecified: Secondary | ICD-10-CM | POA: Diagnosis not present

## 2017-01-15 DIAGNOSIS — L820 Inflamed seborrheic keratosis: Secondary | ICD-10-CM | POA: Diagnosis not present

## 2017-01-17 DIAGNOSIS — L03031 Cellulitis of right toe: Secondary | ICD-10-CM | POA: Diagnosis not present

## 2017-01-29 DIAGNOSIS — G459 Transient cerebral ischemic attack, unspecified: Secondary | ICD-10-CM | POA: Diagnosis not present

## 2017-01-29 DIAGNOSIS — E1140 Type 2 diabetes mellitus with diabetic neuropathy, unspecified: Secondary | ICD-10-CM | POA: Diagnosis not present

## 2017-01-29 DIAGNOSIS — Z6830 Body mass index (BMI) 30.0-30.9, adult: Secondary | ICD-10-CM | POA: Diagnosis not present

## 2017-01-29 DIAGNOSIS — E785 Hyperlipidemia, unspecified: Secondary | ICD-10-CM | POA: Diagnosis not present

## 2017-01-29 DIAGNOSIS — M109 Gout, unspecified: Secondary | ICD-10-CM | POA: Diagnosis not present

## 2017-01-29 DIAGNOSIS — E039 Hypothyroidism, unspecified: Secondary | ICD-10-CM | POA: Diagnosis not present

## 2017-01-29 DIAGNOSIS — I10 Essential (primary) hypertension: Secondary | ICD-10-CM | POA: Diagnosis not present

## 2017-02-04 DIAGNOSIS — Z6830 Body mass index (BMI) 30.0-30.9, adult: Secondary | ICD-10-CM | POA: Diagnosis not present

## 2017-02-04 DIAGNOSIS — Z0000 Encounter for general adult medical examination without abnormal findings: Secondary | ICD-10-CM | POA: Diagnosis not present

## 2017-02-04 DIAGNOSIS — E785 Hyperlipidemia, unspecified: Secondary | ICD-10-CM | POA: Diagnosis not present

## 2017-02-04 DIAGNOSIS — Z9181 History of falling: Secondary | ICD-10-CM | POA: Diagnosis not present

## 2017-02-04 DIAGNOSIS — Z1389 Encounter for screening for other disorder: Secondary | ICD-10-CM | POA: Diagnosis not present

## 2017-02-04 DIAGNOSIS — Z1211 Encounter for screening for malignant neoplasm of colon: Secondary | ICD-10-CM | POA: Diagnosis not present

## 2017-02-11 DIAGNOSIS — L03032 Cellulitis of left toe: Secondary | ICD-10-CM | POA: Insufficient documentation

## 2017-02-11 DIAGNOSIS — L600 Ingrowing nail: Secondary | ICD-10-CM

## 2017-02-11 DIAGNOSIS — E119 Type 2 diabetes mellitus without complications: Secondary | ICD-10-CM | POA: Diagnosis not present

## 2017-02-11 HISTORY — DX: Cellulitis of left toe: L03.032

## 2017-02-11 HISTORY — DX: Ingrowing nail: L60.0

## 2017-02-25 DIAGNOSIS — I10 Essential (primary) hypertension: Secondary | ICD-10-CM | POA: Diagnosis not present

## 2017-02-25 DIAGNOSIS — Z6830 Body mass index (BMI) 30.0-30.9, adult: Secondary | ICD-10-CM | POA: Diagnosis not present

## 2017-02-25 DIAGNOSIS — J019 Acute sinusitis, unspecified: Secondary | ICD-10-CM | POA: Diagnosis not present

## 2017-03-08 DIAGNOSIS — F410 Panic disorder [episodic paroxysmal anxiety] without agoraphobia: Secondary | ICD-10-CM | POA: Diagnosis not present

## 2017-03-08 DIAGNOSIS — Z6830 Body mass index (BMI) 30.0-30.9, adult: Secondary | ICD-10-CM | POA: Diagnosis not present

## 2017-03-13 DIAGNOSIS — Z6829 Body mass index (BMI) 29.0-29.9, adult: Secondary | ICD-10-CM | POA: Diagnosis not present

## 2017-03-13 DIAGNOSIS — G629 Polyneuropathy, unspecified: Secondary | ICD-10-CM | POA: Diagnosis not present

## 2017-03-13 DIAGNOSIS — T17890A Other foreign object in other parts of respiratory tract causing asphyxiation, initial encounter: Secondary | ICD-10-CM | POA: Diagnosis not present

## 2017-03-13 DIAGNOSIS — R0781 Pleurodynia: Secondary | ICD-10-CM | POA: Diagnosis not present

## 2017-03-30 DIAGNOSIS — L300 Nummular dermatitis: Secondary | ICD-10-CM | POA: Diagnosis not present

## 2017-03-30 DIAGNOSIS — L719 Rosacea, unspecified: Secondary | ICD-10-CM | POA: Diagnosis not present

## 2017-03-30 DIAGNOSIS — L820 Inflamed seborrheic keratosis: Secondary | ICD-10-CM | POA: Diagnosis not present

## 2017-04-02 NOTE — Progress Notes (Signed)
Cardiology Office Note:    Date:  04/03/2017   ID:  Meredith Young, DOB [birth year], MRN [id number]  PCP:  Nicoletta Dress, MD  Cardiologist:  Shirlee More, MD    Referring MD: Nicoletta Dress, MD    ASSESSMENT:    1. Essential hypertension   2. Nonrheumatic aortic valve stenosis   3. Hyperlipidemia, unspecified hyperlipidemia type   4. Palpitation   5. Carotid stenosis, bilateral    PLAN:    In order of problems listed above:  1. Stable continue current treatment including beta blocker calcium channel blocker and ARB 2. Stable on exam plan to recheck echocardiogram 1 year for progression 3. Stable continue her statin recent labs requested from her PCP for liver function renal function and LDL cholesterol 4. Stable I encouraged her to get a new adapter for her Smart phone to record episodes for evaluation 5. Stable continue follow-up vascular surgery   Next appointment: one year   Medication Adjustments/Labs and Tests Ordered: Current medicines are reviewed at length with the patient today.  Concerns regarding medicines are outlined above.  Orders Placed This Encounter  Procedures  . EKG 12-Lead   No orders of the defined types were placed in this encounter.   Chief Complaint  Patient presents with  . Follow-up    1 year flup appt   . Palpitations    History of Present Illness:    Meredith Young is a 71 y.o. female with a hx of HTN with mild LVH , LAFB, hyperlipidemia and  mild AS  On echo 2017 last seen one year ago.. Compliance with diet, lifestyle and medications: yes  She feels under a great deal of stress caring for her mother and is somewhat fatigued.She's had palpitation but infrequent not severe and not sustained. She has had no chest pain shortness of breath syncope or TIA. She relates she is up-to-date with vascular surgery and carotid duplex performed. Past Medical History:  Diagnosis Date  . Ankle fracture, left, closed, initial encounter  02/02/2014  . Anxiety 04/28/2014  . Aortic stenosis 02/24/2015   Overview:  Very mild Nov 2015  . Carotid artery occlusion   . Carotid stenosis, bilateral 02/27/2016   Overview:  R ICA > 70%L ICA 50-69%  . Cerebrovascular disease 04/28/2014  . Chronic right shoulder pain 07/26/2016  . Complication of anesthesia   . Depression 04/28/2014  . Diabetes mellitus type II, controlled ([place]) 04/28/2014  . Diabetes mellitus without complication ([place])   . Diverticulitis of colon   . Essential hypertension 04/28/2014  . GERD (gastroesophageal reflux disease)   . Gout   . Gout   . Headache   . History of colonic polyps 04/28/2014  . History of hiatal hernia   . Hx of colonic polyps   . Hyperlipidemia   . Hypertension    HEART BEATS FAST   . Hypothyroidism   . Idiopathic peripheral neuropathy   . Ingrown nail of great toe of left foot 02/11/2017  . Ingrown nail of great toe of right foot 01/04/2017  . Low back pain 04/28/2014  . OA (osteoarthritis) 04/28/2014  . Occlusion and stenosis of carotid artery without mention of cerebral infarction 11/25/2012  . Osteoarthritis   . Palpitation 01/27/2016  . Paronychia of great toe, left 02/11/2017  . Paronychia of great toe, right 01/04/2017  . Pernicious anemia 04/28/2014  . PONV (postoperative nausea and vomiting)   . RLS (restless legs syndrome) 04/28/2014  . Rosacea 04/28/2014  .  S/P rotator cuff repair 09/17/2016  . TIA (transient ischemic attack)    01/2016    Past Surgical History:  Procedure Laterality Date  . ABDOMINAL HYSTERECTOMY    . ANKLE SURGERY     LEFT  . APPENDECTOMY    . CHOLECYSTECTOMY  1991  . ENDARTERECTOMY Right 02/27/2016   Procedure: RIGHT ENDARTERECTOMY CAROTID;  Surgeon: Waynetta Sandy, MD;  Location: [place];  Service: Vascular;  Laterality: Right;  . NASAL SINUS SURGERY  1975  . PATCH ANGIOPLASTY Right 02/27/2016   Procedure: PATCH ANGIOPLASTY USING Rueben Bash BIOLOGIC PATCH;  Surgeon: Waynetta Sandy, MD;   Location: [place];  Service: Vascular;  Laterality: Right;  . POLYPECTOMY  1985  . TOOTH EXTRACTION      Current Medications: Current Meds  Medication Sig  . allopurinol (ZYLOPRIM) 300 MG tablet Take 300 mg by mouth daily.  Marland Kitchen amLODipine (NORVASC) 5 MG tablet Take 5 mg by mouth daily.  Marland Kitchen aspirin EC 81 MG tablet Take 81 mg by mouth daily.  Marland Kitchen doxycycline (PERIOSTAT) 20 MG tablet Take 20 mg by mouth 2 (two) times daily.  . fexofenadine (ALLEGRA) 180 MG tablet Take 180 mg by mouth daily as needed for allergies or rhinitis.  Marland Kitchen levothyroxine (SYNTHROID, LEVOTHROID) 112 MCG tablet Take 112 mcg by mouth daily before breakfast.  . Liraglutide (VICTOZA) 18 MG/3ML SOPN Inject 1.2 mg into the skin daily.   Marland Kitchen losartan (COZAAR) 100 MG tablet Take 100 mg by mouth daily.  . metoprolol tartrate (LOPRESSOR) 25 MG tablet Take 25 mg by mouth 2 (two) times daily.  . Multiple Vitamins-Minerals (MULTIVITAMIN PO) Take 1 tablet by mouth daily.   Marland Kitchen omeprazole (PRILOSEC) 20 MG capsule Take 20 mg by mouth daily.  . pravastatin (PRAVACHOL) 40 MG tablet Take 40 mg by mouth daily.     Allergies:   Statins; Codeine; Demerol [meperidine]; Diltiazem; Metformin; and Metformin and related   Social History   Social History  . Marital status: Single    Spouse name: N/A  . Number of children: N/A  . Years of education: N/A   Social History Main Topics  . Smoking status: Former Smoker    Start date: 07/16/1988  . Smokeless tobacco: Never Used  . Alcohol use No  . Drug use: No  . Sexual activity: Not Asked   Other Topics Concern  . None   Social History Narrative  . None     Family History: The patient's family history includes COPD in her mother; Diabetes in her father and sister; Heart disease in her father and mother; Hyperlipidemia in her father; Hypertension in her father and mother; Other in her father; Stroke in her father. ROS:   Please see the history of present illness.    All other systems reviewed  and are negative.  EKGs/Labs/Other Studies Reviewed:    The following studies were reviewed today:  EKG:  EKG ordered today.  The ekg ordered today demonstrates sinus rhythm left axis deviation left anterior hemiblock unchanged  Recent Labs: No results found for requested labs within last 8760 hours.  Recent Lipid Panel No results found for: CHOL, TRIG, HDL, CHOLHDL, VLDL, LDLCALC, LDLDIRECT  Physical Exam:    VS:  BP 112/60 (BP Location: Right Arm, Patient Position: Sitting)   Pulse 80   Ht 5\' 3"  (1.6 m)   Wt 169 lb 12.8 oz (77 kg)   SpO2 99%   BMI 30.08 kg/m     Wt Readings from Last 3 Encounters:  04/03/17 169 lb 12.8 oz (77 kg)  11/16/16 162 lb (73.5 kg)  03/23/16 160 lb (72.6 kg)     GEN:  Well nourished, well developed in no acute distress HEENT: Normal NECK: No JVD; No carotid bruits LYMPHATICS: No lymphadenopathy CARDIAC: 1/6 SEM AS in the aotric area RRR,  rubs, gallops RESPIRATORY:  Clear to auscultation without rales, wheezing or rhonchi  ABDOMEN: Soft, non-tender, non-distended MUSCULOSKELETAL:  No edema; No deformity  SKIN: Warm and dry NEUROLOGIC:  Alert and oriented x 3 PSYCHIATRIC:  Normal affect    Signed, Shirlee More, MD  04/03/2017 1:12 PM    [facility]

## 2017-04-03 ENCOUNTER — Ambulatory Visit (INDEPENDENT_AMBULATORY_CARE_PROVIDER_SITE_OTHER): Payer: Medicare Other | Admitting: Cardiology

## 2017-04-03 ENCOUNTER — Encounter: Payer: Self-pay | Admitting: Cardiology

## 2017-04-03 VITALS — BP 112/60 | HR 80 | Ht 63.0 in | Wt 169.8 lb

## 2017-04-03 DIAGNOSIS — I6523 Occlusion and stenosis of bilateral carotid arteries: Secondary | ICD-10-CM | POA: Diagnosis not present

## 2017-04-03 DIAGNOSIS — E785 Hyperlipidemia, unspecified: Secondary | ICD-10-CM | POA: Diagnosis not present

## 2017-04-03 DIAGNOSIS — I350 Nonrheumatic aortic (valve) stenosis: Secondary | ICD-10-CM | POA: Diagnosis not present

## 2017-04-03 DIAGNOSIS — R002 Palpitations: Secondary | ICD-10-CM | POA: Diagnosis not present

## 2017-04-03 DIAGNOSIS — I10 Essential (primary) hypertension: Secondary | ICD-10-CM | POA: Diagnosis not present

## 2017-04-03 NOTE — Patient Instructions (Addendum)
Medication Instructions:  Your physician recommends that you continue on your current medications as directed. Please refer to the Current Medication list given to you today.  Labwork: None  Testing/Procedures: You had an EKG today.  Follow-Up: Your physician wants you to follow-up in: 1 year. You will receive a reminder letter in the mail two months in advance. If you don't receive a letter, please call our office to schedule the follow-up appointment.   Any Other Special Instructions Will Be Listed Below (If Applicable).     If you need a refill on your cardiac medications before your next appointment, please call your pharmacy.    [email or web address] [email or web address]        FDA-cleared, clinical grade mobile EKG monitor: Jodelle Red is the most clinically-validated mobile EKG used by the world's leading cardiac care medical professionals With Basic service, know instantly if your heart rhythm is normal or if atrial fibrillation is detected, and email the last single EKG recording to yourself or your doctor Premium service, available for purchase through the Kardia app for $9.99 per month or $99 per year, includes unlimited history and storage of your EKG recordings, a monthly EKG summary report to share with your doctor, along with the ability to track your blood pressure, activity and weight Includes one KardiaMobile phone clip FREE SHIPPING: Standard delivery 1-3 business days. Orders placed by 11:00am PST will ship that afternoon. Otherwise, will ship next business day. All orders ship via [employer] from [place], [place]

## 2017-04-30 DIAGNOSIS — Z23 Encounter for immunization: Secondary | ICD-10-CM | POA: Diagnosis not present

## 2017-05-06 DIAGNOSIS — G459 Transient cerebral ischemic attack, unspecified: Secondary | ICD-10-CM | POA: Diagnosis not present

## 2017-05-06 DIAGNOSIS — Z6830 Body mass index (BMI) 30.0-30.9, adult: Secondary | ICD-10-CM | POA: Diagnosis not present

## 2017-05-06 DIAGNOSIS — E039 Hypothyroidism, unspecified: Secondary | ICD-10-CM | POA: Diagnosis not present

## 2017-05-06 DIAGNOSIS — E785 Hyperlipidemia, unspecified: Secondary | ICD-10-CM | POA: Diagnosis not present

## 2017-05-06 DIAGNOSIS — Z1231 Encounter for screening mammogram for malignant neoplasm of breast: Secondary | ICD-10-CM | POA: Diagnosis not present

## 2017-05-06 DIAGNOSIS — M109 Gout, unspecified: Secondary | ICD-10-CM | POA: Diagnosis not present

## 2017-05-06 DIAGNOSIS — I10 Essential (primary) hypertension: Secondary | ICD-10-CM | POA: Diagnosis not present

## 2017-05-06 DIAGNOSIS — E1140 Type 2 diabetes mellitus with diabetic neuropathy, unspecified: Secondary | ICD-10-CM | POA: Diagnosis not present

## 2017-05-24 ENCOUNTER — Encounter: Payer: Self-pay | Admitting: Vascular Surgery

## 2017-05-24 ENCOUNTER — Ambulatory Visit (INDEPENDENT_AMBULATORY_CARE_PROVIDER_SITE_OTHER): Payer: Medicare Other | Admitting: Vascular Surgery

## 2017-05-24 ENCOUNTER — Ambulatory Visit (HOSPITAL_COMMUNITY)
Admission: RE | Admit: 2017-05-24 | Discharge: 2017-05-24 | Disposition: A | Discharge status: Home / Self Care | Payer: Medicare Other | Source: Ambulatory Visit | Attending: Vascular Surgery | Admitting: Vascular Surgery

## 2017-05-24 VITALS — BP 109/65 | HR 82 | Temp 97.5°F | Resp 16 | Ht 63.0 in | Wt 170.0 lb

## 2017-05-24 DIAGNOSIS — I6521 Occlusion and stenosis of right carotid artery: Secondary | ICD-10-CM | POA: Diagnosis not present

## 2017-05-24 DIAGNOSIS — I6523 Occlusion and stenosis of bilateral carotid arteries: Secondary | ICD-10-CM | POA: Insufficient documentation

## 2017-05-24 LAB — VAS US CAROTID
LEFT ECA DIAS: -12 cm/s
LEFT VERTEBRAL DIAS: -14 cm/s
LICAPDIAS: -45 cm/s
Left CCA dist dias: -35 cm/s
Left CCA dist sys: -129 cm/s
Left CCA prox dias: 20 cm/s
Left CCA prox sys: 95 cm/s
Left ICA dist dias: -28 cm/s
Left ICA dist sys: -74 cm/s
Left ICA prox sys: -186 cm/s
RCCADSYS: -91 cm/s
RCCAPDIAS: 21 cm/s
RIGHT CCA MID DIAS: 16 cm/s
RIGHT ECA DIAS: 12 cm/s
RIGHT VERTEBRAL DIAS: -13 cm/s
Right CCA prox sys: 121 cm/s

## 2017-05-24 NOTE — Progress Notes (Signed)
Patient ID: Meredith Young, female   DOB: [birth year], 71 y.o.   MRN: [id number]  Reason for Consult: Carotid   Referred by Nicoletta Dress, MD  Subjective:     HPI:  Meredith Young is a 71 y.o. female follows up from carotid endarterectomy performed 1 year ago for symptomatic disease. She has had no further stroke TIA or amaurosis. She remains very active does pain in the left calf with walking with a known ABI that is reduced. She does not have rest pain or tissue loss. She takes aspirin and statin drugs daily. She has no complaints related to today's visit.  Past Medical History:  Diagnosis Date  . Ankle fracture, left, closed, initial encounter 02/02/2014  . Anxiety 04/28/2014  . Aortic stenosis 02/24/2015   Overview:  Very mild Nov 2015  . Carotid artery occlusion   . Carotid stenosis, bilateral 02/27/2016   Overview:  R ICA > 70%L ICA 50-69%  . Cerebrovascular disease 04/28/2014  . Chronic right shoulder pain 07/26/2016  . Complication of anesthesia   . Depression 04/28/2014  . Diabetes mellitus type II, controlled ([place]) 04/28/2014  . Diabetes mellitus without complication ([place])   . Diverticulitis of colon   . Essential hypertension 04/28/2014  . GERD (gastroesophageal reflux disease)   . Gout   . Gout   . Headache   . History of colonic polyps 04/28/2014  . History of hiatal hernia   . Hx of colonic polyps   . Hyperlipidemia   . Hypertension    HEART BEATS FAST   . Hypothyroidism   . Idiopathic peripheral neuropathy   . Ingrown nail of great toe of left foot 02/11/2017  . Ingrown nail of great toe of right foot 01/04/2017  . Low back pain 04/28/2014  . OA (osteoarthritis) 04/28/2014  . Occlusion and stenosis of carotid artery without mention of cerebral infarction 11/25/2012  . Osteoarthritis   . Palpitation 01/27/2016  . Paronychia of great toe, left 02/11/2017  . Paronychia of great toe, right 01/04/2017  . Pernicious anemia 04/28/2014  . PONV (postoperative  nausea and vomiting)   . RLS (restless legs syndrome) 04/28/2014  . Rosacea 04/28/2014  . S/P rotator cuff repair 09/17/2016  . TIA (transient ischemic attack)    01/2016   Family History  Problem Relation Age of Onset  . Hypertension Mother   . Heart disease Mother   . COPD Mother   . Stroke Father   . Diabetes Father   . Heart disease Father        before age 70  . Hyperlipidemia Father   . Hypertension Father   . Other Father        amputation of great toe  . Diabetes Sister    Past Surgical History:  Procedure Laterality Date  . ABDOMINAL HYSTERECTOMY    . ANKLE SURGERY     LEFT  . APPENDECTOMY    . CHOLECYSTECTOMY  1991  . NASAL SINUS SURGERY  1975  . POLYPECTOMY  1985  . TOOTH EXTRACTION      Short Social History:  Social History   Tobacco Use  . Smoking status: Former Smoker    Start date: 07/16/1988  . Smokeless tobacco: Never Used  Substance Use Topics  . Alcohol use: No    Allergies  Allergen Reactions  . Statins Other (See Comments)    Leg aches, Myalgias Leg aches, Myalgias   . Codeine Nausea And Vomiting  . Demerol [Meperidine] Nausea And  Vomiting and Other (See Comments)    Violent behavior/reaction  . Diltiazem Other (See Comments)    UNSPECIFIED , Unknown  . Metformin Other (See Comments)    UNSPECIFIED  . Metformin And Related Other (See Comments)    UNSPECIFIED     Current Outpatient Medications  Medication Sig Dispense Refill  . allopurinol (ZYLOPRIM) 300 MG tablet Take 300 mg by mouth daily.    Marland Kitchen ALPRAZolam (XANAX) 0.25 MG tablet Take 0.25 mg 3 (three) times daily by mouth.    Marland Kitchen amLODipine (NORVASC) 5 MG tablet Take 5 mg by mouth daily.  0  . aspirin EC 81 MG tablet Take 81 mg by mouth daily.    Marland Kitchen gabapentin (NEURONTIN) 300 MG capsule Take 300 mg 3 (three) times daily by mouth.    . levothyroxine (SYNTHROID, LEVOTHROID) 112 MCG tablet Take 112 mcg by mouth daily before breakfast.    . Liraglutide (VICTOZA) 18 MG/3ML SOPN Inject 1.2  mg into the skin daily.     Marland Kitchen losartan (COZAAR) 100 MG tablet Take 100 mg by mouth daily.  3  . metoprolol tartrate (LOPRESSOR) 25 MG tablet Take 25 mg by mouth 2 (two) times daily.    . Multiple Vitamins-Minerals (MULTIVITAMIN PO) Take 1 tablet by mouth daily.     Marland Kitchen omeprazole (PRILOSEC) 20 MG capsule Take 20 mg by mouth daily.    . pravastatin (PRAVACHOL) 40 MG tablet Take 40 mg by mouth daily.    Marland Kitchen doxycycline (PERIOSTAT) 20 MG tablet Take 20 mg by mouth 2 (two) times daily.  99  . fexofenadine (ALLEGRA) 180 MG tablet Take 180 mg by mouth daily as needed for allergies or rhinitis.     No current facility-administered medications for this visit.     Review of Systems  Constitutional:  Constitutional negative. HENT: HENT negative.  Eyes: Eyes negative.  Respiratory: Respiratory negative.  Cardiovascular: Positive for claudication.  GI: Gastrointestinal negative.  Musculoskeletal: Musculoskeletal negative.  Neurological: Neurological negative. Hematologic: Hematologic/lymphatic negative.  Psychiatric: Psychiatric negative.        Objective:  Objective   Vitals:   05/24/17 1426 05/24/17 1428  BP: 94/68 109/65  Pulse: 80 82  Resp: 16   Temp: (!) 97.5 F (36.4 C)   SpO2: 96%   Weight: 170 lb (77.1 kg)   Height: 5\' 3"  (1.6 m)    Body mass index is 30.11 kg/m.  Physical Exam  Constitutional: She is oriented to person, place, and time. She appears well-developed.  HENT:  Head: Normocephalic.  Eyes: Pupils are equal, round, and reactive to light.  Neck: Normal range of motion.  Well healed right neck incision  Cardiovascular: Normal rate.  Pulmonary/Chest: Effort normal.  Abdominal: Soft. She exhibits no mass.  Musculoskeletal: Normal range of motion. She exhibits no edema.  Neurological: She is alert and oriented to person, place, and time. No cranial nerve deficit.  Skin: Skin is warm and dry.  Psychiatric: She has a normal mood and affect. Her behavior is normal.  Judgment and thought content normal.    Data: I've independently interpreted her carotid duplex which demonstrates 40-59% left carotid stenosis. The right-sided carotid endarterectomy site      Ass has mild hyperplasia with less than 40% stenosis.essment/Plan:     71 year old female follows up from previous carotid endarterectomy for which she is doing very well without further symptoms. We discussed the signs and symptoms of stroke TIA and amaurosis again. She does have minimal claudication with previous ABIs but  she does not have rest pain tissue loss and is not limited in her lifestyle. Less than point we'll see her in 1 year with repeat carotid duplex. She is to remain on aspirin and statin.      Waynetta Sandy MD [facility]

## 2017-05-28 DIAGNOSIS — Z1231 Encounter for screening mammogram for malignant neoplasm of breast: Secondary | ICD-10-CM | POA: Diagnosis not present

## 2017-05-30 DIAGNOSIS — E119 Type 2 diabetes mellitus without complications: Secondary | ICD-10-CM | POA: Diagnosis not present

## 2017-05-30 DIAGNOSIS — H2513 Age-related nuclear cataract, bilateral: Secondary | ICD-10-CM | POA: Diagnosis not present

## 2017-06-04 DIAGNOSIS — Z6830 Body mass index (BMI) 30.0-30.9, adult: Secondary | ICD-10-CM | POA: Diagnosis not present

## 2017-06-04 DIAGNOSIS — M5031 Other cervical disc degeneration,  high cervical region: Secondary | ICD-10-CM | POA: Diagnosis not present

## 2017-06-04 DIAGNOSIS — M439 Deforming dorsopathy, unspecified: Secondary | ICD-10-CM | POA: Diagnosis not present

## 2017-06-04 DIAGNOSIS — M542 Cervicalgia: Secondary | ICD-10-CM | POA: Diagnosis not present

## 2017-06-12 NOTE — Addendum Note (Signed)
Addended by: Lianne Cure A on: 06/12/2017 04:53 PM   Modules accepted: Orders

## 2017-07-08 DIAGNOSIS — J019 Acute sinusitis, unspecified: Secondary | ICD-10-CM | POA: Diagnosis not present

## 2017-07-08 DIAGNOSIS — Z23 Encounter for immunization: Secondary | ICD-10-CM | POA: Diagnosis not present

## 2017-07-08 DIAGNOSIS — S61452A Open bite of left hand, initial encounter: Secondary | ICD-10-CM | POA: Diagnosis not present

## 2017-07-22 DIAGNOSIS — N390 Urinary tract infection, site not specified: Secondary | ICD-10-CM | POA: Diagnosis not present

## 2017-07-22 DIAGNOSIS — Z6830 Body mass index (BMI) 30.0-30.9, adult: Secondary | ICD-10-CM | POA: Diagnosis not present

## 2017-07-23 DIAGNOSIS — N390 Urinary tract infection, site not specified: Secondary | ICD-10-CM | POA: Diagnosis not present

## 2017-08-06 DIAGNOSIS — E039 Hypothyroidism, unspecified: Secondary | ICD-10-CM | POA: Diagnosis not present

## 2017-08-06 DIAGNOSIS — E1140 Type 2 diabetes mellitus with diabetic neuropathy, unspecified: Secondary | ICD-10-CM | POA: Diagnosis not present

## 2017-08-06 DIAGNOSIS — M109 Gout, unspecified: Secondary | ICD-10-CM | POA: Diagnosis not present

## 2017-08-06 DIAGNOSIS — Z6830 Body mass index (BMI) 30.0-30.9, adult: Secondary | ICD-10-CM | POA: Diagnosis not present

## 2017-08-06 DIAGNOSIS — E785 Hyperlipidemia, unspecified: Secondary | ICD-10-CM | POA: Diagnosis not present

## 2017-08-06 DIAGNOSIS — N390 Urinary tract infection, site not specified: Secondary | ICD-10-CM | POA: Diagnosis not present

## 2017-08-06 DIAGNOSIS — I10 Essential (primary) hypertension: Secondary | ICD-10-CM | POA: Diagnosis not present

## 2017-08-06 DIAGNOSIS — G459 Transient cerebral ischemic attack, unspecified: Secondary | ICD-10-CM | POA: Diagnosis not present

## 2017-08-15 ENCOUNTER — Telehealth: Payer: Self-pay | Admitting: Cardiology

## 2017-08-15 NOTE — Telephone Encounter (Signed)
Patient's sister ended up in the emergency room and ICU because of her blood, kidneys, sugar, etc. Patient's sister advised that this patient should be sure that she is taking all the right medications because "the same thing that happened to my sister might happen to me."  Patient wants someone that can take care of her diabetes, kidneys, and everything together. Patient states that Dr. Delena Bali is good, but she wants to be sure that she is taking the right medications. Patient doesn't know that she's on the right medications for her diabetes that is not hurting her kidneys.   Patient states that you may tell her that she is with the right doctor, Dr. Delena Bali, but if not, she wants to know who else she should see.

## 2017-08-15 NOTE — Telephone Encounter (Signed)
Wants a referral to hematology/oncology

## 2017-08-19 DIAGNOSIS — J208 Acute bronchitis due to other specified organisms: Secondary | ICD-10-CM | POA: Diagnosis not present

## 2017-08-19 DIAGNOSIS — H1033 Unspecified acute conjunctivitis, bilateral: Secondary | ICD-10-CM | POA: Diagnosis not present

## 2017-08-19 DIAGNOSIS — R6889 Other general symptoms and signs: Secondary | ICD-10-CM | POA: Diagnosis not present

## 2017-08-22 DIAGNOSIS — H6123 Impacted cerumen, bilateral: Secondary | ICD-10-CM | POA: Diagnosis not present

## 2017-08-22 DIAGNOSIS — H1033 Unspecified acute conjunctivitis, bilateral: Secondary | ICD-10-CM | POA: Diagnosis not present

## 2017-08-22 DIAGNOSIS — H60311 Diffuse otitis externa, right ear: Secondary | ICD-10-CM | POA: Diagnosis not present

## 2017-08-22 DIAGNOSIS — J208 Acute bronchitis due to other specified organisms: Secondary | ICD-10-CM | POA: Diagnosis not present

## 2017-08-22 DIAGNOSIS — H9201 Otalgia, right ear: Secondary | ICD-10-CM | POA: Diagnosis not present

## 2017-08-22 DIAGNOSIS — Z6829 Body mass index (BMI) 29.0-29.9, adult: Secondary | ICD-10-CM | POA: Diagnosis not present

## 2017-08-24 DIAGNOSIS — J209 Acute bronchitis, unspecified: Secondary | ICD-10-CM | POA: Diagnosis not present

## 2017-08-24 DIAGNOSIS — J40 Bronchitis, not specified as acute or chronic: Secondary | ICD-10-CM | POA: Diagnosis not present

## 2017-08-24 DIAGNOSIS — R0602 Shortness of breath: Secondary | ICD-10-CM | POA: Diagnosis not present

## 2017-08-24 DIAGNOSIS — R42 Dizziness and giddiness: Secondary | ICD-10-CM | POA: Diagnosis not present

## 2017-08-24 DIAGNOSIS — E860 Dehydration: Secondary | ICD-10-CM | POA: Diagnosis not present

## 2017-08-27 DIAGNOSIS — H60311 Diffuse otitis externa, right ear: Secondary | ICD-10-CM | POA: Diagnosis not present

## 2017-08-27 DIAGNOSIS — H6691 Otitis media, unspecified, right ear: Secondary | ICD-10-CM | POA: Diagnosis not present

## 2017-08-27 DIAGNOSIS — Z6829 Body mass index (BMI) 29.0-29.9, adult: Secondary | ICD-10-CM | POA: Diagnosis not present

## 2017-08-27 DIAGNOSIS — J208 Acute bronchitis due to other specified organisms: Secondary | ICD-10-CM | POA: Diagnosis not present

## 2017-08-27 DIAGNOSIS — E860 Dehydration: Secondary | ICD-10-CM | POA: Diagnosis not present

## 2017-08-27 DIAGNOSIS — Z79899 Other long term (current) drug therapy: Secondary | ICD-10-CM | POA: Diagnosis not present

## 2017-09-16 DIAGNOSIS — H9201 Otalgia, right ear: Secondary | ICD-10-CM | POA: Diagnosis not present

## 2017-09-24 DIAGNOSIS — L300 Nummular dermatitis: Secondary | ICD-10-CM | POA: Diagnosis not present

## 2017-09-24 DIAGNOSIS — C44622 Squamous cell carcinoma of skin of right upper limb, including shoulder: Secondary | ICD-10-CM | POA: Diagnosis not present

## 2017-09-24 DIAGNOSIS — E119 Type 2 diabetes mellitus without complications: Secondary | ICD-10-CM | POA: Diagnosis not present

## 2017-09-24 DIAGNOSIS — H2513 Age-related nuclear cataract, bilateral: Secondary | ICD-10-CM | POA: Diagnosis not present

## 2017-09-24 DIAGNOSIS — L853 Xerosis cutis: Secondary | ICD-10-CM | POA: Diagnosis not present

## 2017-09-27 DIAGNOSIS — N3090 Cystitis, unspecified without hematuria: Secondary | ICD-10-CM | POA: Diagnosis not present

## 2017-09-27 DIAGNOSIS — R300 Dysuria: Secondary | ICD-10-CM | POA: Diagnosis not present

## 2017-10-10 DIAGNOSIS — C44622 Squamous cell carcinoma of skin of right upper limb, including shoulder: Secondary | ICD-10-CM | POA: Diagnosis not present

## 2017-10-15 DIAGNOSIS — N3281 Overactive bladder: Secondary | ICD-10-CM | POA: Diagnosis not present

## 2017-10-15 DIAGNOSIS — N952 Postmenopausal atrophic vaginitis: Secondary | ICD-10-CM | POA: Diagnosis not present

## 2017-10-15 DIAGNOSIS — N3020 Other chronic cystitis without hematuria: Secondary | ICD-10-CM | POA: Diagnosis not present

## 2017-10-16 DIAGNOSIS — H65111 Acute and subacute allergic otitis media (mucoid) (sanguinous) (serous), right ear: Secondary | ICD-10-CM | POA: Diagnosis not present

## 2017-10-16 DIAGNOSIS — H9201 Otalgia, right ear: Secondary | ICD-10-CM | POA: Diagnosis not present

## 2017-10-16 DIAGNOSIS — E119 Type 2 diabetes mellitus without complications: Secondary | ICD-10-CM | POA: Diagnosis not present

## 2017-10-16 DIAGNOSIS — Z794 Long term (current) use of insulin: Secondary | ICD-10-CM | POA: Diagnosis not present

## 2017-10-16 DIAGNOSIS — H9011 Conductive hearing loss, unilateral, right ear, with unrestricted hearing on the contralateral side: Secondary | ICD-10-CM | POA: Diagnosis not present

## 2017-10-16 DIAGNOSIS — Z8669 Personal history of other diseases of the nervous system and sense organs: Secondary | ICD-10-CM | POA: Diagnosis not present

## 2017-10-16 DIAGNOSIS — H6981 Other specified disorders of Eustachian tube, right ear: Secondary | ICD-10-CM | POA: Diagnosis not present

## 2017-11-05 DIAGNOSIS — M109 Gout, unspecified: Secondary | ICD-10-CM | POA: Diagnosis not present

## 2017-11-05 DIAGNOSIS — E039 Hypothyroidism, unspecified: Secondary | ICD-10-CM | POA: Diagnosis not present

## 2017-11-05 DIAGNOSIS — E785 Hyperlipidemia, unspecified: Secondary | ICD-10-CM | POA: Diagnosis not present

## 2017-11-05 DIAGNOSIS — Z6829 Body mass index (BMI) 29.0-29.9, adult: Secondary | ICD-10-CM | POA: Diagnosis not present

## 2017-11-05 DIAGNOSIS — E1140 Type 2 diabetes mellitus with diabetic neuropathy, unspecified: Secondary | ICD-10-CM | POA: Diagnosis not present

## 2017-11-05 DIAGNOSIS — I10 Essential (primary) hypertension: Secondary | ICD-10-CM | POA: Diagnosis not present

## 2017-11-12 DIAGNOSIS — H6981 Other specified disorders of Eustachian tube, right ear: Secondary | ICD-10-CM | POA: Diagnosis not present

## 2017-11-12 DIAGNOSIS — H90A31 Mixed conductive and sensorineural hearing loss, unilateral, right ear with restricted hearing on the contralateral side: Secondary | ICD-10-CM | POA: Diagnosis not present

## 2017-11-12 DIAGNOSIS — H6521 Chronic serous otitis media, right ear: Secondary | ICD-10-CM | POA: Diagnosis not present

## 2017-11-18 DIAGNOSIS — J208 Acute bronchitis due to other specified organisms: Secondary | ICD-10-CM | POA: Diagnosis not present

## 2017-11-19 DIAGNOSIS — Z01818 Encounter for other preprocedural examination: Secondary | ICD-10-CM | POA: Diagnosis not present

## 2017-11-19 DIAGNOSIS — H25811 Combined forms of age-related cataract, right eye: Secondary | ICD-10-CM | POA: Diagnosis not present

## 2017-12-02 DIAGNOSIS — E039 Hypothyroidism, unspecified: Secondary | ICD-10-CM | POA: Diagnosis not present

## 2017-12-24 DIAGNOSIS — H53022 Refractive amblyopia, left eye: Secondary | ICD-10-CM | POA: Diagnosis not present

## 2017-12-24 DIAGNOSIS — E039 Hypothyroidism, unspecified: Secondary | ICD-10-CM | POA: Diagnosis not present

## 2017-12-24 DIAGNOSIS — M109 Gout, unspecified: Secondary | ICD-10-CM | POA: Diagnosis not present

## 2017-12-24 DIAGNOSIS — Z79899 Other long term (current) drug therapy: Secondary | ICD-10-CM | POA: Diagnosis not present

## 2017-12-24 DIAGNOSIS — E785 Hyperlipidemia, unspecified: Secondary | ICD-10-CM | POA: Diagnosis not present

## 2017-12-24 DIAGNOSIS — F419 Anxiety disorder, unspecified: Secondary | ICD-10-CM | POA: Diagnosis not present

## 2017-12-24 DIAGNOSIS — K219 Gastro-esophageal reflux disease without esophagitis: Secondary | ICD-10-CM | POA: Diagnosis not present

## 2017-12-24 DIAGNOSIS — H52223 Regular astigmatism, bilateral: Secondary | ICD-10-CM | POA: Diagnosis not present

## 2017-12-24 DIAGNOSIS — Z8673 Personal history of transient ischemic attack (TIA), and cerebral infarction without residual deficits: Secondary | ICD-10-CM | POA: Diagnosis not present

## 2017-12-24 DIAGNOSIS — H259 Unspecified age-related cataract: Secondary | ICD-10-CM | POA: Diagnosis not present

## 2017-12-24 DIAGNOSIS — Z7982 Long term (current) use of aspirin: Secondary | ICD-10-CM | POA: Diagnosis not present

## 2017-12-24 DIAGNOSIS — I10 Essential (primary) hypertension: Secondary | ICD-10-CM | POA: Diagnosis not present

## 2017-12-24 DIAGNOSIS — H5010 Unspecified exotropia: Secondary | ICD-10-CM | POA: Diagnosis not present

## 2017-12-24 DIAGNOSIS — Z87891 Personal history of nicotine dependence: Secondary | ICD-10-CM | POA: Diagnosis not present

## 2017-12-24 DIAGNOSIS — K449 Diaphragmatic hernia without obstruction or gangrene: Secondary | ICD-10-CM | POA: Diagnosis not present

## 2017-12-24 DIAGNOSIS — E1136 Type 2 diabetes mellitus with diabetic cataract: Secondary | ICD-10-CM | POA: Diagnosis not present

## 2017-12-24 DIAGNOSIS — E119 Type 2 diabetes mellitus without complications: Secondary | ICD-10-CM | POA: Diagnosis not present

## 2017-12-24 DIAGNOSIS — Z794 Long term (current) use of insulin: Secondary | ICD-10-CM | POA: Diagnosis not present

## 2017-12-24 DIAGNOSIS — H25811 Combined forms of age-related cataract, right eye: Secondary | ICD-10-CM | POA: Diagnosis not present

## 2017-12-24 DIAGNOSIS — Z85828 Personal history of other malignant neoplasm of skin: Secondary | ICD-10-CM | POA: Diagnosis not present

## 2018-01-21 DIAGNOSIS — E119 Type 2 diabetes mellitus without complications: Secondary | ICD-10-CM | POA: Diagnosis not present

## 2018-01-21 DIAGNOSIS — Z79899 Other long term (current) drug therapy: Secondary | ICD-10-CM | POA: Diagnosis not present

## 2018-01-21 DIAGNOSIS — Z87891 Personal history of nicotine dependence: Secondary | ICD-10-CM | POA: Diagnosis not present

## 2018-01-21 DIAGNOSIS — H25812 Combined forms of age-related cataract, left eye: Secondary | ICD-10-CM | POA: Diagnosis not present

## 2018-01-21 DIAGNOSIS — I10 Essential (primary) hypertension: Secondary | ICD-10-CM | POA: Diagnosis not present

## 2018-01-21 DIAGNOSIS — H259 Unspecified age-related cataract: Secondary | ICD-10-CM | POA: Diagnosis not present

## 2018-01-21 DIAGNOSIS — E785 Hyperlipidemia, unspecified: Secondary | ICD-10-CM | POA: Diagnosis not present

## 2018-01-21 DIAGNOSIS — Z8673 Personal history of transient ischemic attack (TIA), and cerebral infarction without residual deficits: Secondary | ICD-10-CM | POA: Diagnosis not present

## 2018-02-04 DIAGNOSIS — M109 Gout, unspecified: Secondary | ICD-10-CM | POA: Diagnosis not present

## 2018-02-04 DIAGNOSIS — E039 Hypothyroidism, unspecified: Secondary | ICD-10-CM | POA: Diagnosis not present

## 2018-02-04 DIAGNOSIS — E785 Hyperlipidemia, unspecified: Secondary | ICD-10-CM | POA: Diagnosis not present

## 2018-02-04 DIAGNOSIS — E1140 Type 2 diabetes mellitus with diabetic neuropathy, unspecified: Secondary | ICD-10-CM | POA: Diagnosis not present

## 2018-02-13 DIAGNOSIS — M79671 Pain in right foot: Secondary | ICD-10-CM | POA: Diagnosis not present

## 2018-02-19 DIAGNOSIS — H26493 Other secondary cataract, bilateral: Secondary | ICD-10-CM | POA: Diagnosis not present

## 2018-02-20 DIAGNOSIS — M545 Low back pain: Secondary | ICD-10-CM | POA: Diagnosis not present

## 2018-02-20 DIAGNOSIS — M5440 Lumbago with sciatica, unspecified side: Secondary | ICD-10-CM | POA: Diagnosis not present

## 2018-02-26 DIAGNOSIS — N390 Urinary tract infection, site not specified: Secondary | ICD-10-CM | POA: Diagnosis not present

## 2018-03-18 DIAGNOSIS — E1140 Type 2 diabetes mellitus with diabetic neuropathy, unspecified: Secondary | ICD-10-CM | POA: Diagnosis not present

## 2018-03-26 DIAGNOSIS — Z136 Encounter for screening for cardiovascular disorders: Secondary | ICD-10-CM | POA: Diagnosis not present

## 2018-03-26 DIAGNOSIS — E785 Hyperlipidemia, unspecified: Secondary | ICD-10-CM | POA: Diagnosis not present

## 2018-03-26 DIAGNOSIS — Z1239 Encounter for other screening for malignant neoplasm of breast: Secondary | ICD-10-CM | POA: Diagnosis not present

## 2018-03-26 DIAGNOSIS — Z0000 Encounter for general adult medical examination without abnormal findings: Secondary | ICD-10-CM | POA: Diagnosis not present

## 2018-03-26 DIAGNOSIS — Z9181 History of falling: Secondary | ICD-10-CM | POA: Diagnosis not present

## 2018-03-26 DIAGNOSIS — Z6830 Body mass index (BMI) 30.0-30.9, adult: Secondary | ICD-10-CM | POA: Diagnosis not present

## 2018-03-26 DIAGNOSIS — Z1331 Encounter for screening for depression: Secondary | ICD-10-CM | POA: Diagnosis not present

## 2018-03-26 DIAGNOSIS — N959 Unspecified menopausal and perimenopausal disorder: Secondary | ICD-10-CM | POA: Diagnosis not present

## 2018-04-01 DIAGNOSIS — Z4589 Encounter for adjustment and management of other implanted devices: Secondary | ICD-10-CM | POA: Diagnosis not present

## 2018-04-01 DIAGNOSIS — H6981 Other specified disorders of Eustachian tube, right ear: Secondary | ICD-10-CM | POA: Diagnosis not present

## 2018-04-10 DIAGNOSIS — Z6830 Body mass index (BMI) 30.0-30.9, adult: Secondary | ICD-10-CM | POA: Diagnosis not present

## 2018-04-10 DIAGNOSIS — E1140 Type 2 diabetes mellitus with diabetic neuropathy, unspecified: Secondary | ICD-10-CM | POA: Diagnosis not present

## 2018-04-18 DIAGNOSIS — Z23 Encounter for immunization: Secondary | ICD-10-CM | POA: Diagnosis not present

## 2018-05-13 DIAGNOSIS — L299 Pruritus, unspecified: Secondary | ICD-10-CM | POA: Diagnosis not present

## 2018-05-13 DIAGNOSIS — L300 Nummular dermatitis: Secondary | ICD-10-CM | POA: Diagnosis not present

## 2018-05-13 DIAGNOSIS — L853 Xerosis cutis: Secondary | ICD-10-CM | POA: Diagnosis not present

## 2018-05-13 DIAGNOSIS — L820 Inflamed seborrheic keratosis: Secondary | ICD-10-CM | POA: Diagnosis not present

## 2018-05-27 DIAGNOSIS — E1140 Type 2 diabetes mellitus with diabetic neuropathy, unspecified: Secondary | ICD-10-CM | POA: Diagnosis not present

## 2018-05-27 DIAGNOSIS — M109 Gout, unspecified: Secondary | ICD-10-CM | POA: Diagnosis not present

## 2018-05-27 DIAGNOSIS — I10 Essential (primary) hypertension: Secondary | ICD-10-CM | POA: Diagnosis not present

## 2018-05-27 DIAGNOSIS — E039 Hypothyroidism, unspecified: Secondary | ICD-10-CM | POA: Diagnosis not present

## 2018-05-27 DIAGNOSIS — E785 Hyperlipidemia, unspecified: Secondary | ICD-10-CM | POA: Diagnosis not present

## 2018-06-06 ENCOUNTER — Ambulatory Visit: Payer: Medicare Other | Admitting: Vascular Surgery

## 2018-06-06 ENCOUNTER — Encounter (HOSPITAL_COMMUNITY): Payer: Medicare Other

## 2018-06-17 DIAGNOSIS — Z1231 Encounter for screening mammogram for malignant neoplasm of breast: Secondary | ICD-10-CM | POA: Diagnosis not present

## 2018-06-25 ENCOUNTER — Other Ambulatory Visit: Payer: Self-pay

## 2018-06-25 DIAGNOSIS — I6521 Occlusion and stenosis of right carotid artery: Secondary | ICD-10-CM

## 2018-07-04 ENCOUNTER — Ambulatory Visit: Payer: Medicare Other | Admitting: Vascular Surgery

## 2018-07-04 ENCOUNTER — Encounter (HOSPITAL_COMMUNITY): Payer: Medicare Other

## 2018-07-18 ENCOUNTER — Ambulatory Visit (INDEPENDENT_AMBULATORY_CARE_PROVIDER_SITE_OTHER): Payer: Medicare Other | Admitting: Vascular Surgery

## 2018-07-18 ENCOUNTER — Encounter: Payer: Self-pay | Admitting: Vascular Surgery

## 2018-07-18 ENCOUNTER — Ambulatory Visit (HOSPITAL_COMMUNITY)
Admission: RE | Admit: 2018-07-18 | Discharge: 2018-07-18 | Disposition: A | Discharge status: Home / Self Care | Payer: Medicare Other | Source: Ambulatory Visit | Attending: Vascular Surgery | Admitting: Vascular Surgery

## 2018-07-18 ENCOUNTER — Other Ambulatory Visit: Payer: Self-pay

## 2018-07-18 VITALS — BP 139/74 | HR 79 | Temp 97.6°F | Resp 16 | Ht 63.0 in | Wt 153.0 lb

## 2018-07-18 DIAGNOSIS — I6521 Occlusion and stenosis of right carotid artery: Secondary | ICD-10-CM | POA: Diagnosis not present

## 2018-07-18 NOTE — Progress Notes (Signed)
Patient ID: Meredith Young, female   DOB: [birth year], 73 y.o.   MRN: [id number]  Reason for Consult: Carotid (1 yr f/u bilat Carotid. )   Referred by Nicoletta Dress, MD  Subjective:     HPI:  Meredith Young is a 73 y.o. female with history of symptomatic right carotid artery for which she underwent endarterectomy.  She has done well.  No further stroke TIA or amaurosis.  She does have some minimal pain with walking in the left lower extremity does not want anything done for this.  Denies rest pain tissue loss.  Takes aspirin when she remembers and statin.  Past Medical History:  Diagnosis Date  . Ankle fracture, left, closed, initial encounter 02/02/2014  . Anxiety 04/28/2014  . Aortic stenosis 02/24/2015   Overview:  Very mild Nov 2015  . Carotid artery occlusion   . Carotid stenosis, bilateral 02/27/2016   Overview:  R ICA > 70%L ICA 50-69%  . Cerebrovascular disease 04/28/2014  . Chronic right shoulder pain 07/26/2016  . Complication of anesthesia   . Depression 04/28/2014  . Diabetes mellitus type II, controlled ([place]) 04/28/2014  . Diabetes mellitus without complication ([place])   . Diverticulitis of colon   . Essential hypertension 04/28/2014  . GERD (gastroesophageal reflux disease)   . Gout   . Gout   . Headache   . History of colonic polyps 04/28/2014  . History of hiatal hernia   . Hx of colonic polyps   . Hyperlipidemia   . Hypertension    HEART BEATS FAST   . Hypothyroidism   . Idiopathic peripheral neuropathy   . Ingrown nail of great toe of left foot 02/11/2017  . Ingrown nail of great toe of right foot 01/04/2017  . Low back pain 04/28/2014  . OA (osteoarthritis) 04/28/2014  . Occlusion and stenosis of carotid artery without mention of cerebral infarction 11/25/2012  . Osteoarthritis   . Palpitation 01/27/2016  . Paronychia of great toe, left 02/11/2017  . Paronychia of great toe, right 01/04/2017  . Pernicious anemia 04/28/2014  . PONV (postoperative  nausea and vomiting)   . RLS (restless legs syndrome) 04/28/2014  . Rosacea 04/28/2014  . S/P rotator cuff repair 09/17/2016  . TIA (transient ischemic attack)    01/2016   Family History  Problem Relation Age of Onset  . Hypertension Mother   . Heart disease Mother   . COPD Mother   . Stroke Father   . Diabetes Father   . Heart disease Father        before age 40  . Hyperlipidemia Father   . Hypertension Father   . Other Father        amputation of great toe  . Diabetes Sister    Past Surgical History:  Procedure Laterality Date  . ABDOMINAL HYSTERECTOMY    . ANKLE SURGERY     LEFT  . APPENDECTOMY    . CHOLECYSTECTOMY  1991  . ENDARTERECTOMY Right 02/27/2016   Procedure: RIGHT ENDARTERECTOMY CAROTID;  Surgeon: Waynetta Sandy, MD;  Location: [facility];  Service: Vascular;  Laterality: Right;  . NASAL SINUS SURGERY  1975  . PATCH ANGIOPLASTY Right 02/27/2016   Procedure: PATCH ANGIOPLASTY USING Rueben Bash BIOLOGIC PATCH;  Surgeon: Waynetta Sandy, MD;  Location: [facility];  Service: Vascular;  Laterality: Right;  . POLYPECTOMY  1985  . TOOTH EXTRACTION      Short Social History:  Social History   Tobacco Use  . Smoking  status: Former Smoker    Start date: 07/16/1988  . Smokeless tobacco: Never Used  Substance Use Topics  . Alcohol use: No    Allergies  Allergen Reactions  . Statins Other (See Comments)    Leg aches, Myalgias Leg aches, Myalgias   . Codeine Nausea And Vomiting  . Demerol [Meperidine] Nausea And Vomiting and Other (See Comments)    Violent behavior/reaction  . Diltiazem Other (See Comments)    UNSPECIFIED , Unknown  . Metformin Other (See Comments)    UNSPECIFIED  . Metformin And Related Other (See Comments)    UNSPECIFIED     Current Outpatient Medications  Medication Sig Dispense Refill  . allopurinol (ZYLOPRIM) 300 MG tablet Take 300 mg by mouth daily.    Marland Kitchen ALPRAZolam (XANAX) 0.25 MG tablet Take 0.25 mg 3 (three) times daily by  mouth.    Marland Kitchen amLODipine (NORVASC) 5 MG tablet Take 5 mg by mouth daily.  0  . aspirin EC 81 MG tablet Take 81 mg by mouth daily.    Marland Kitchen doxycycline (PERIOSTAT) 20 MG tablet Take 20 mg by mouth 2 (two) times daily.  99  . fexofenadine (ALLEGRA) 180 MG tablet Take 180 mg by mouth daily as needed for allergies or rhinitis.    Marland Kitchen gabapentin (NEURONTIN) 300 MG capsule Take 300 mg 3 (three) times daily by mouth.    . levothyroxine (SYNTHROID, LEVOTHROID) 112 MCG tablet Take 112 mcg by mouth daily before breakfast.    . Liraglutide (VICTOZA) 18 MG/3ML SOPN Inject 1.2 mg into the skin daily.     Marland Kitchen losartan (COZAAR) 100 MG tablet Take 100 mg by mouth daily.  3  . metoprolol tartrate (LOPRESSOR) 25 MG tablet Take 25 mg by mouth 2 (two) times daily.    . Multiple Vitamins-Minerals (MULTIVITAMIN PO) Take 1 tablet by mouth daily.     Marland Kitchen omeprazole (PRILOSEC) 20 MG capsule Take 20 mg by mouth daily.    . pravastatin (PRAVACHOL) 40 MG tablet Take 40 mg by mouth daily.     No current facility-administered medications for this visit.     Review of Systems  Constitutional:  Constitutional negative. HENT: HENT negative.  Eyes: Eyes negative.  Respiratory: Respiratory negative.  Cardiovascular: Cardiovascular negative.  GI: Gastrointestinal negative.  Musculoskeletal: Musculoskeletal negative.  Skin: Skin negative.  Neurological: Neurological negative. Hematologic: Hematologic/lymphatic negative.  Psychiatric: Psychiatric negative.        Objective:  Objective   Vitals:   07/18/18 1531 07/18/18 1534  BP: 127/69 139/74  Pulse: 79   Resp: 16   Temp: 97.6 F (36.4 C)   TempSrc: Oral   SpO2: 98%   Weight: 153 lb (69.4 kg)   Height: 5\' 3"  (1.6 m)    Body mass index is 27.1 kg/m.  Physical Exam HENT:     Head: Normocephalic.  Eyes:     Pupils: Pupils are equal, round, and reactive to light.  Neck:     Musculoskeletal: Normal range of motion.  Cardiovascular:     Rate and Rhythm: Normal  rate.     Pulses:          Carotid pulses are 2+ on the right side and 2+ on the left side.      Radial pulses are 2+ on the right side and 2+ on the left side.  Pulmonary:     Effort: Pulmonary effort is normal.  Abdominal:     General: Abdomen is flat.  Musculoskeletal: Normal range of motion.  General: No swelling.  Skin:    General: Skin is warm and dry.     Capillary Refill: Capillary refill takes less than 2 seconds.  Neurological:     General: No focal deficit present.     Mental Status: She is alert.  Psychiatric:        Mood and Affect: Mood normal.     Data: I have independently interpreted her bilateral carotid duplex which demonstrates 1 to 39% stenosis on the right and 40 to 59% stenosis on the left.     Assessment/Plan:     73 year old female status post right carotid endarterectomy for symptomatic disease she is done very well.  No further issues or issues related to today's visit.  Plan to follow-up in 1 year with repeat carotid duplex.  I discussed with her taking aspirin on a regular basis and she agrees to comply     Waynetta Sandy MD [facility]

## 2018-07-31 DIAGNOSIS — E1140 Type 2 diabetes mellitus with diabetic neuropathy, unspecified: Secondary | ICD-10-CM | POA: Diagnosis not present

## 2018-07-31 DIAGNOSIS — E039 Hypothyroidism, unspecified: Secondary | ICD-10-CM | POA: Diagnosis not present

## 2018-08-14 DIAGNOSIS — L300 Nummular dermatitis: Secondary | ICD-10-CM | POA: Diagnosis not present

## 2018-08-26 DIAGNOSIS — L209 Atopic dermatitis, unspecified: Secondary | ICD-10-CM | POA: Diagnosis not present

## 2018-09-01 DIAGNOSIS — R195 Other fecal abnormalities: Secondary | ICD-10-CM | POA: Diagnosis not present

## 2018-09-02 DIAGNOSIS — I10 Essential (primary) hypertension: Secondary | ICD-10-CM | POA: Diagnosis not present

## 2018-09-02 DIAGNOSIS — E1140 Type 2 diabetes mellitus with diabetic neuropathy, unspecified: Secondary | ICD-10-CM | POA: Diagnosis not present

## 2018-09-02 DIAGNOSIS — E039 Hypothyroidism, unspecified: Secondary | ICD-10-CM | POA: Diagnosis not present

## 2018-09-02 DIAGNOSIS — M109 Gout, unspecified: Secondary | ICD-10-CM | POA: Diagnosis not present

## 2018-09-02 DIAGNOSIS — L300 Nummular dermatitis: Secondary | ICD-10-CM | POA: Diagnosis not present

## 2018-09-02 DIAGNOSIS — L299 Pruritus, unspecified: Secondary | ICD-10-CM | POA: Diagnosis not present

## 2018-09-02 DIAGNOSIS — E785 Hyperlipidemia, unspecified: Secondary | ICD-10-CM | POA: Diagnosis not present

## 2018-09-03 DIAGNOSIS — M109 Gout, unspecified: Secondary | ICD-10-CM | POA: Diagnosis not present

## 2018-09-04 DIAGNOSIS — M10071 Idiopathic gout, right ankle and foot: Secondary | ICD-10-CM | POA: Diagnosis not present

## 2018-09-04 DIAGNOSIS — M1A0710 Idiopathic chronic gout, right ankle and foot, without tophus (tophi): Secondary | ICD-10-CM | POA: Insufficient documentation

## 2018-09-15 DIAGNOSIS — E119 Type 2 diabetes mellitus without complications: Secondary | ICD-10-CM | POA: Diagnosis not present

## 2018-09-18 DIAGNOSIS — E119 Type 2 diabetes mellitus without complications: Secondary | ICD-10-CM | POA: Diagnosis not present

## 2018-09-18 DIAGNOSIS — M10071 Idiopathic gout, right ankle and foot: Secondary | ICD-10-CM | POA: Diagnosis not present

## 2018-10-03 DIAGNOSIS — H0016 Chalazion left eye, unspecified eyelid: Secondary | ICD-10-CM | POA: Diagnosis not present

## 2018-10-06 DIAGNOSIS — H00015 Hordeolum externum left lower eyelid: Secondary | ICD-10-CM | POA: Diagnosis not present

## 2018-10-27 DIAGNOSIS — K5792 Diverticulitis of intestine, part unspecified, without perforation or abscess without bleeding: Secondary | ICD-10-CM | POA: Diagnosis not present

## 2018-11-01 DIAGNOSIS — L299 Pruritus, unspecified: Secondary | ICD-10-CM | POA: Diagnosis not present

## 2018-11-01 DIAGNOSIS — L209 Atopic dermatitis, unspecified: Secondary | ICD-10-CM | POA: Diagnosis not present

## 2018-11-01 DIAGNOSIS — L821 Other seborrheic keratosis: Secondary | ICD-10-CM | POA: Diagnosis not present

## 2018-12-05 DIAGNOSIS — H0288A Meibomian gland dysfunction right eye, upper and lower eyelids: Secondary | ICD-10-CM | POA: Diagnosis not present

## 2018-12-09 DIAGNOSIS — I10 Essential (primary) hypertension: Secondary | ICD-10-CM | POA: Diagnosis not present

## 2018-12-09 DIAGNOSIS — M109 Gout, unspecified: Secondary | ICD-10-CM | POA: Diagnosis not present

## 2018-12-09 DIAGNOSIS — E785 Hyperlipidemia, unspecified: Secondary | ICD-10-CM | POA: Diagnosis not present

## 2018-12-09 DIAGNOSIS — E1140 Type 2 diabetes mellitus with diabetic neuropathy, unspecified: Secondary | ICD-10-CM | POA: Diagnosis not present

## 2018-12-09 DIAGNOSIS — E039 Hypothyroidism, unspecified: Secondary | ICD-10-CM | POA: Diagnosis not present

## 2018-12-11 DIAGNOSIS — S59901A Unspecified injury of right elbow, initial encounter: Secondary | ICD-10-CM | POA: Diagnosis not present

## 2018-12-11 DIAGNOSIS — S52131A Displaced fracture of neck of right radius, initial encounter for closed fracture: Secondary | ICD-10-CM | POA: Diagnosis not present

## 2019-01-01 DIAGNOSIS — S52124D Nondisplaced fracture of head of right radius, subsequent encounter for closed fracture with routine healing: Secondary | ICD-10-CM | POA: Diagnosis not present

## 2019-01-26 DIAGNOSIS — S61217A Laceration without foreign body of left little finger without damage to nail, initial encounter: Secondary | ICD-10-CM | POA: Diagnosis not present

## 2019-01-27 DIAGNOSIS — Z9889 Other specified postprocedural states: Secondary | ICD-10-CM | POA: Diagnosis not present

## 2019-01-27 DIAGNOSIS — S82832A Other fracture of upper and lower end of left fibula, initial encounter for closed fracture: Secondary | ICD-10-CM | POA: Diagnosis not present

## 2019-01-27 DIAGNOSIS — Z8781 Personal history of (healed) traumatic fracture: Secondary | ICD-10-CM | POA: Diagnosis not present

## 2019-01-27 DIAGNOSIS — M25571 Pain in right ankle and joints of right foot: Secondary | ICD-10-CM | POA: Diagnosis not present

## 2019-01-27 DIAGNOSIS — H7292 Unspecified perforation of tympanic membrane, left ear: Secondary | ICD-10-CM | POA: Insufficient documentation

## 2019-01-27 DIAGNOSIS — H6981 Other specified disorders of Eustachian tube, right ear: Secondary | ICD-10-CM | POA: Diagnosis not present

## 2019-01-27 DIAGNOSIS — H6121 Impacted cerumen, right ear: Secondary | ICD-10-CM | POA: Diagnosis not present

## 2019-01-27 DIAGNOSIS — T8484XA Pain due to internal orthopedic prosthetic devices, implants and grafts, initial encounter: Secondary | ICD-10-CM | POA: Diagnosis not present

## 2019-01-27 DIAGNOSIS — H6122 Impacted cerumen, left ear: Secondary | ICD-10-CM | POA: Insufficient documentation

## 2019-01-29 DIAGNOSIS — L300 Nummular dermatitis: Secondary | ICD-10-CM | POA: Diagnosis not present

## 2019-01-29 DIAGNOSIS — L299 Pruritus, unspecified: Secondary | ICD-10-CM | POA: Diagnosis not present

## 2019-02-16 DIAGNOSIS — N390 Urinary tract infection, site not specified: Secondary | ICD-10-CM | POA: Diagnosis not present

## 2019-03-04 DIAGNOSIS — L820 Inflamed seborrheic keratosis: Secondary | ICD-10-CM | POA: Diagnosis not present

## 2019-03-04 DIAGNOSIS — L209 Atopic dermatitis, unspecified: Secondary | ICD-10-CM | POA: Diagnosis not present

## 2019-03-04 DIAGNOSIS — L299 Pruritus, unspecified: Secondary | ICD-10-CM | POA: Diagnosis not present

## 2019-03-04 DIAGNOSIS — L03213 Periorbital cellulitis: Secondary | ICD-10-CM | POA: Diagnosis not present

## 2019-03-04 DIAGNOSIS — H02883 Meibomian gland dysfunction of right eye, unspecified eyelid: Secondary | ICD-10-CM | POA: Diagnosis not present

## 2019-03-10 DIAGNOSIS — M109 Gout, unspecified: Secondary | ICD-10-CM | POA: Diagnosis not present

## 2019-03-10 DIAGNOSIS — I10 Essential (primary) hypertension: Secondary | ICD-10-CM | POA: Diagnosis not present

## 2019-03-10 DIAGNOSIS — E1165 Type 2 diabetes mellitus with hyperglycemia: Secondary | ICD-10-CM | POA: Diagnosis not present

## 2019-03-10 DIAGNOSIS — Z139 Encounter for screening, unspecified: Secondary | ICD-10-CM | POA: Diagnosis not present

## 2019-03-10 DIAGNOSIS — E785 Hyperlipidemia, unspecified: Secondary | ICD-10-CM | POA: Diagnosis not present

## 2019-03-10 DIAGNOSIS — E1140 Type 2 diabetes mellitus with diabetic neuropathy, unspecified: Secondary | ICD-10-CM | POA: Diagnosis not present

## 2019-03-10 DIAGNOSIS — E039 Hypothyroidism, unspecified: Secondary | ICD-10-CM | POA: Diagnosis not present

## 2019-03-24 DIAGNOSIS — M1A0710 Idiopathic chronic gout, right ankle and foot, without tophus (tophi): Secondary | ICD-10-CM | POA: Diagnosis not present

## 2019-03-24 DIAGNOSIS — J069 Acute upper respiratory infection, unspecified: Secondary | ICD-10-CM | POA: Diagnosis not present

## 2019-03-24 DIAGNOSIS — E119 Type 2 diabetes mellitus without complications: Secondary | ICD-10-CM | POA: Diagnosis not present

## 2019-03-24 DIAGNOSIS — H6691 Otitis media, unspecified, right ear: Secondary | ICD-10-CM | POA: Diagnosis not present

## 2019-03-30 DIAGNOSIS — Z0000 Encounter for general adult medical examination without abnormal findings: Secondary | ICD-10-CM | POA: Diagnosis not present

## 2019-03-30 DIAGNOSIS — Z9181 History of falling: Secondary | ICD-10-CM | POA: Diagnosis not present

## 2019-03-30 DIAGNOSIS — Z1331 Encounter for screening for depression: Secondary | ICD-10-CM | POA: Diagnosis not present

## 2019-03-30 DIAGNOSIS — E785 Hyperlipidemia, unspecified: Secondary | ICD-10-CM | POA: Diagnosis not present

## 2019-03-30 DIAGNOSIS — N959 Unspecified menopausal and perimenopausal disorder: Secondary | ICD-10-CM | POA: Diagnosis not present

## 2019-03-30 DIAGNOSIS — Z1231 Encounter for screening mammogram for malignant neoplasm of breast: Secondary | ICD-10-CM | POA: Diagnosis not present

## 2019-05-01 DIAGNOSIS — T85618A Breakdown (mechanical) of other specified internal prosthetic devices, implants and grafts, initial encounter: Secondary | ICD-10-CM | POA: Diagnosis not present

## 2019-05-01 DIAGNOSIS — H9201 Otalgia, right ear: Secondary | ICD-10-CM | POA: Diagnosis not present

## 2019-05-01 DIAGNOSIS — Z9622 Myringotomy tube(s) status: Secondary | ICD-10-CM | POA: Diagnosis not present

## 2019-05-01 DIAGNOSIS — Z8669 Personal history of other diseases of the nervous system and sense organs: Secondary | ICD-10-CM | POA: Diagnosis not present

## 2019-05-04 DIAGNOSIS — R42 Dizziness and giddiness: Secondary | ICD-10-CM | POA: Diagnosis not present

## 2019-05-04 DIAGNOSIS — I10 Essential (primary) hypertension: Secondary | ICD-10-CM | POA: Diagnosis not present

## 2019-05-04 DIAGNOSIS — R519 Headache, unspecified: Secondary | ICD-10-CM | POA: Diagnosis not present

## 2019-05-04 DIAGNOSIS — R52 Pain, unspecified: Secondary | ICD-10-CM | POA: Diagnosis not present

## 2019-05-08 DIAGNOSIS — Z23 Encounter for immunization: Secondary | ICD-10-CM | POA: Diagnosis not present

## 2019-05-09 DIAGNOSIS — L300 Nummular dermatitis: Secondary | ICD-10-CM | POA: Diagnosis not present

## 2019-05-09 DIAGNOSIS — L820 Inflamed seborrheic keratosis: Secondary | ICD-10-CM | POA: Diagnosis not present

## 2019-05-09 DIAGNOSIS — L299 Pruritus, unspecified: Secondary | ICD-10-CM | POA: Diagnosis not present

## 2019-05-11 DIAGNOSIS — T161XXA Foreign body in right ear, initial encounter: Secondary | ICD-10-CM | POA: Diagnosis not present

## 2019-05-11 DIAGNOSIS — Z9622 Myringotomy tube(s) status: Secondary | ICD-10-CM | POA: Diagnosis not present

## 2019-05-13 DIAGNOSIS — R42 Dizziness and giddiness: Secondary | ICD-10-CM | POA: Diagnosis not present

## 2019-05-13 DIAGNOSIS — I10 Essential (primary) hypertension: Secondary | ICD-10-CM | POA: Diagnosis not present

## 2019-05-13 DIAGNOSIS — R000 Tachycardia, unspecified: Secondary | ICD-10-CM | POA: Diagnosis not present

## 2019-05-13 DIAGNOSIS — R531 Weakness: Secondary | ICD-10-CM | POA: Diagnosis not present

## 2019-05-13 DIAGNOSIS — R519 Headache, unspecified: Secondary | ICD-10-CM | POA: Diagnosis not present

## 2019-05-14 DIAGNOSIS — I10 Essential (primary) hypertension: Secondary | ICD-10-CM | POA: Diagnosis not present

## 2019-05-14 DIAGNOSIS — R0989 Other specified symptoms and signs involving the circulatory and respiratory systems: Secondary | ICD-10-CM | POA: Diagnosis not present

## 2019-05-14 NOTE — Progress Notes (Signed)
Cardiology Office Note:    Date:  05/15/2019   ID:  Meredith Young, DOB 1945-11-28, MRN [id number]  PCP:  Nicoletta Dress, MD  Cardiologist:  Shirlee More, MD    Referring MD: Nicoletta Dress, MD    ASSESSMENT:    1. Palpitations   2. Essential hypertension   3. Nonrheumatic aortic valve stenosis    PLAN:    In order of problems listed above:  1. Difficult to define historically but I suspect she is having intermittent arrhythmia likely atrial fibrillation will apply 1 week CO monitor to try to document the episodes 2. Pressure looks controlled on current treatment I think a problem is her checking her blood pressure with an accurate device we went over how to accurately measure blood pressure and given a prescription for clonidine to take if needed 3. Aortic stenosis she is not having typical symptoms but needs an echocardiogram quickly to define if she has significant aortic stenosis contributing to episodes 4. Her lipidemia is not on lipid-lowering therapy 5. Type 2 diabetes stable managed by her PCP   Next appointment: 4 weeks   Medication Adjustments/Labs and Tests Ordered: Current medicines are reviewed at length with the patient today.  Concerns regarding medicines are outlined above.  No orders of the defined types were placed in this encounter.  No orders of the defined types were placed in this encounter.   Chief Complaint  Patient presents with   Hypertension    recent ED visit    History of Present Illness:    Meredith Young is a 73 y.o. female with a hx of HTN with mild LVH , LAFB, hyperlipidemia and  mild AS     last seen 04/03/2017. Compliance with diet, lifestyle and medications: Yes  In [facility] ED 05/13/2019 with complaints of elevated blood pressure she had a visit the week before with the same problems her blood pressure on presentation was 214/96 laboratory studies showed normal CBC hemoglobin 14.4 potassium 4.4 creatinine  elevated at 1.20 GFR stage IIIa 44 cc/min she had a troponin drawn that was normal she had a mildly elevated proBNP level at 7.12 in the emergency room she received a liter of IV saline without a specific reason and was given her usual antihypertensive medications there is a notation that she need to follow-up with her PCP or possibly be referred to cardiology at discharge she was advised to continue her usual antihypertensive medications.  She had a chest x-ray performed which was interpreted as normal and did not appear to have an EKG  Is a very difficult visit.  For 2 weeks now she has been having spells just about every night this event awakens her from her sleep is characterized by her heart beating rapidly.  Unfortunate she is using a wrist blood pressure cuff which is giving very erratic readings and she does not recall what her heart rates are.  When she has been seen in the emergency room she been very anxious and the events have been all over.  She tells me she was told by the ER staff she had an arrhythmia it sounds like PVCs but there is no documentation or strips available in the medical record her EKG in the emergency room showed sinus rhythm left axis deviation left anterior hemiblock.  For further evaluation I asked her to purchase an arm digital blood pressure cuff and gave her instructions how to accurately measure blood pressure twice a day at rest.  If she has paroxysmal hypertension I will give her prescription for clonidine.  I asked her to wear a 1 week ZIO monitor to see if she is having paroxysmal atrial fibrillation.  I asked her avoid over-the-counter proarrhythmic drugs.  She starts the visit by telling me she is having angina but she has had no chest pain.  When the episodes occur she feels funny at times feels like she has trouble swallowing.  No edema shortness of breath or syncope.  Very frustrated that these episodes continue to occur and that the 2 visits to the emergency room has  not changed things Past Medical History:  Diagnosis Date   Ankle fracture, left, closed, initial encounter 02/02/2014   Anxiety 04/28/2014   Aortic stenosis 02/24/2015   Overview:  Very mild Nov 2015   Carotid artery occlusion    Carotid stenosis, bilateral 02/27/2016   Overview:  R ICA > 70%L ICA 50-69%   Cerebrovascular disease 04/28/2014   Chronic right shoulder pain [id number]   Complication of anesthesia    Depression 04/28/2014   Diabetes mellitus type II, controlled ([place]) 04/28/2014   Diabetes mellitus without complication ([place])    Diverticulitis of colon    Essential hypertension 04/28/2014   GERD (gastroesophageal reflux disease)    Gout    Gout    Headache    History of colonic polyps 04/28/2014   History of hiatal hernia    Hx of colonic polyps    Hyperlipidemia    Hypertension    HEART BEATS FAST    Hypothyroidism    Idiopathic peripheral neuropathy    Ingrown nail of great toe of left foot 02/11/2017   Ingrown nail of great toe of right foot 01/04/2017   Low back pain 04/28/2014   OA (osteoarthritis) 04/28/2014   Occlusion and stenosis of carotid artery without mention of cerebral infarction 11/25/2012   Osteoarthritis    Palpitation 01/27/2016   Paronychia of great toe, left 02/11/2017   Paronychia of great toe, right 01/04/2017   Pernicious anemia 04/28/2014   PONV (postoperative nausea and vomiting)    RLS (restless legs syndrome) 04/28/2014   Rosacea 04/28/2014   S/P rotator cuff repair 09/17/2016   TIA (transient ischemic attack)    01/2016    Past Surgical History:  Procedure Laterality Date   ABDOMINAL HYSTERECTOMY     ANKLE SURGERY     LEFT   APPENDECTOMY     CHOLECYSTECTOMY  1991   ENDARTERECTOMY Right 02/27/2016   Procedure: RIGHT ENDARTERECTOMY CAROTID;  Surgeon: Waynetta Sandy, MD;  Location: [facility];  Service: Vascular;  Laterality: Right;   [place] ANGIOPLASTY Right  02/27/2016   Procedure: PATCH ANGIOPLASTY USING Rueben Bash BIOLOGIC PATCH;  Surgeon: Waynetta Sandy, MD;  Location: [facility] OR;  Service: Vascular;  Laterality: Right;   POLYPECTOMY  1985   TOOTH EXTRACTION      Current Medications: Current Meds  Medication Sig   allopurinol (ZYLOPRIM) 300 MG tablet Take 300 mg by mouth daily.   amLODipine (NORVASC) 5 MG tablet Take 5 mg by mouth daily.   aspirin EC 81 MG tablet Take 81 mg by mouth daily.   Dulaglutide (TRULICITY) 1.5 0000000 SOPN Inject into the skin once a week.   fexofenadine (ALLEGRA) 180 MG tablet Take 180 mg by mouth daily as needed for allergies or rhinitis.   folic acid (FOLVITE) 1 MG tablet Take 1 mg by mouth 2 (two) times daily.   glimepiride (  AMARYL) 1 MG tablet Take 1 mg by mouth daily.   levothyroxine (SYNTHROID) 88 MCG tablet Take 88 mcg by mouth daily before breakfast.   losartan (COZAAR) 100 MG tablet Take 100 mg by mouth daily.   metoprolol tartrate (LOPRESSOR) 25 MG tablet Take 25 mg by mouth 2 (two) times daily.   Multiple Vitamins-Minerals (MULTIVITAMIN PO) Take 1 tablet by mouth daily.    omeprazole (PRILOSEC) 20 MG capsule Take 20 mg by mouth daily.     Allergies:   Statins, Codeine, Demerol [meperidine], Diltiazem, Metformin, and Metformin and related   Social History   Socioeconomic History   Marital status: Single    Spouse name: Not on file   Number of children: Not on file   Years of education: Not on file   Highest education level: Not on file  Occupational History   Not on file  Social Needs   Financial resource strain: Not on file   Food insecurity    Worry: Not on file    Inability: Not on file   Transportation needs    Medical: Not on file    Non-medical: Not on file  Tobacco Use   Smoking status: Former Smoker    Start date: 07/16/1988   Smokeless tobacco: Never Used  Substance and Sexual Activity   Alcohol use: No   Drug use: No   Sexual activity: Not on  file  Lifestyle   Physical activity    Days per week: Not on file    Minutes per session: Not on file   Stress: Not on file  Relationships   Social connections    Talks on phone: Not on file    Gets together: Not on file    Attends religious service: Not on file    Active member of club or organization: Not on file    Attends meetings of clubs or organizations: Not on file    Relationship status: Not on file  Other Topics Concern   Not on file  Social History Narrative   Not on file     Family History: The patient's family history includes COPD in her mother; Diabetes in her father and sister; Heart disease in her father and mother; Hyperlipidemia in her father; Hypertension in her father and mother; Other in her father; Stroke in her father. ROS:   Please see the history of present illness.    All other systems reviewed and are negative.  EKGs/Labs/Other Studies Reviewed:    The following studies were reviewed today:    Physical Exam:    VS:  BP (!) 130/58 (BP Location: Left Arm, Patient Position: Sitting, Cuff Size: Normal)    Pulse 89    Ht 5\' 3"  (1.6 m)    Wt 160 lb (72.6 kg)    SpO2 97%    BMI 28.34 kg/m     Wt Readings from Last 3 Encounters:  05/15/19 160 lb (72.6 kg)  07/18/18 153 lb (69.4 kg)  05/24/17 170 lb (77.1 kg)     GEN: Anxious well nourished, well developed in no acute distress HEENT: Normal NECK: No JVD; No carotid bruits LYMPHATICS: No lymphadenopathy CARDIAC: RRR, no murmurs, rubs, gallops RESPIRATORY:  Clear to auscultation without rales, wheezing or rhonchi  ABDOMEN: Soft, non-tender, non-distended MUSCULOSKELETAL:  No edema; No deformity  SKIN: Warm and dry NEUROLOGIC:  Alert and oriented x 3 PSYCHIATRIC:  Normal affect    Signed, Shirlee More, MD  05/15/2019 9:33 AM    [facility]

## 2019-05-15 ENCOUNTER — Encounter: Payer: Self-pay | Admitting: Cardiology

## 2019-05-15 ENCOUNTER — Ambulatory Visit (INDEPENDENT_AMBULATORY_CARE_PROVIDER_SITE_OTHER): Payer: Medicare Other

## 2019-05-15 ENCOUNTER — Other Ambulatory Visit: Payer: Self-pay

## 2019-05-15 ENCOUNTER — Ambulatory Visit (INDEPENDENT_AMBULATORY_CARE_PROVIDER_SITE_OTHER): Payer: Medicare Other | Admitting: Cardiology

## 2019-05-15 VITALS — BP 130/58 | HR 89 | Ht 63.0 in | Wt 160.0 lb

## 2019-05-15 DIAGNOSIS — I10 Essential (primary) hypertension: Secondary | ICD-10-CM | POA: Diagnosis not present

## 2019-05-15 DIAGNOSIS — I350 Nonrheumatic aortic (valve) stenosis: Secondary | ICD-10-CM | POA: Diagnosis not present

## 2019-05-15 DIAGNOSIS — R002 Palpitations: Secondary | ICD-10-CM

## 2019-05-15 MED ORDER — CLONIDINE HCL 0.1 MG PO TABS: ORAL_TABLET | ORAL | 3 refills | Status: DC

## 2019-05-15 NOTE — Patient Instructions (Addendum)
Medication Instructions:  Your physician has recommended you make the following change in your medication:  START clonidine (catapres) 0.1 mg: Take 1 tablet (0.1 mg) daily as needed if your systolic BP (top number) is greater than 180.   *If you need a refill on your cardiac medications before your next appointment, please call your pharmacy*  Lab Work: None  If you have labs (blood work) drawn today and your tests are completely normal, you will receive your results only by: Marland Kitchen MyChart Message (if you have MyChart) OR . A paper copy in the mail If you have any lab test that is abnormal or we need to change your treatment, we will call you to review the results.  Testing/Procedures: You had an EKG today.   Your physician has requested that you have an echocardiogram. Echocardiography is a painless test that uses sound waves to create images of your heart. It provides your doctor with information about the size and shape of your heart and how well your heart's chambers and valves are working. This procedure takes approximately one hour. There are no restrictions for this procedure.  Your physician has recommended that you wear a ZIO monitor. ZIO monitors are medical devices that record the heart's electrical activity. Doctors most often use these monitors to diagnose arrhythmias. Arrhythmias are problems with the speed or rhythm of the heartbeat. The monitor is a small, portable device. You can wear one while you do your normal daily activities. This is usually used to diagnose what is causing palpitations/syncope (passing out). Wear for 7 days.   Follow-Up: At [facility], you and your health needs are our priority.  As part of our continuing mission to provide you with exceptional heart care, we have created designated Provider Care Teams.  These Care Teams include your primary Cardiologist (physician) and Advanced Practice Providers (APPs -  Physician Assistants and Nurse Practitioners)  who all work together to provide you with the care you need, when you need it.  Your next appointment:   4 weeks  The format for your next appointment:   In Person  Provider:   Shirlee More, MD  Other Instructions **Purchase a digital arm blood pressure cuff and check your blood pressure twice daily at the same time every day mid morning and early evening. Record these readings and bring your BP log with you to your next appointment for Dr. Bettina Gavia to review.      [facility]  Tips to measure your blood pressure correctly  To determine whether you have hypertension, a medical professional will take a blood pressure reading. How you prepare for the test, the position of your arm, and other factors can change a blood pressure reading by 10% or more. That could be enough to hide high blood pressure, start you on a drug you don't really need, or lead your doctor to incorrectly adjust your medications. National and international guidelines offer specific instructions for measuring blood pressure. If a doctor, nurse, or medical assistant isn't doing it right, don't hesitate to ask him or her to get with the guidelines. Here's what you can do to ensure a correct reading: . Don't drink a caffeinated beverage or smoke during the 30 minutes before the test. . Sit quietly for five minutes before the test begins. . During the measurement, sit in a chair with your feet on the floor and your arm supported so your elbow is at about heart level. . The inflatable part of the cuff should completely  cover at least 80% of your upper arm, and the cuff should be placed on bare skin, not over a shirt. . Don't talk during the measurement. . Have your blood pressure measured twice, with a brief break in between. If the readings are different by 5 points or more, have it done a third time. There are times to break these rules. If you sometimes feel lightheaded when getting out of bed in the morning or when you  stand after sitting, you should have your blood pressure checked while seated and then while standing to see if it falls from one position to the next. Because blood pressure varies throughout the day, your doctor will rarely diagnose hypertension on the basis of a single reading. Instead, he or she will want to confirm the measurements on at least two occasions, usually within a few weeks of one another. The exception to this rule is if you have a blood pressure reading of 180/110 mm Hg or higher. A result this high usually calls for prompt treatment. It's also a good idea to have your blood pressure measured in both arms at least once, since the reading in one arm (usually the right) may be higher than that in the left. A 2014 study in The American Journal of Medicine of nearly 3,400 people found average arm- to-arm differences in systolic blood pressure of about 5 points. The higher number should be used to make treatment decisions. In 2017, new guidelines from the [place], the [employer] of Cardiology, and nine other health organizations lowered the diagnosis of high blood pressure to 130/80 mm Hg or higher for all adults. The guidelines also redefined the various blood pressure categories to now include normal, elevated, Stage 1 hypertension, Stage 2 hypertension, and hypertensive crisis (see "Blood pressure categories"). Blood pressure categories  Blood pressure category SYSTOLIC (upper number)  DIASTOLIC (lower number)  Normal Less than 120 mm Hg and Less than 80 mm Hg  Elevated 120-129 mm Hg and Less than 80 mm Hg  High blood pressure: Stage 1 hypertension 130-139 mm Hg or 80-89 mm Hg  High blood pressure: Stage 2 hypertension 140 mm Hg or higher or 90 mm Hg or higher  Hypertensive crisis (consult your doctor immediately) Higher than 180 mm Hg and/or Higher than 120 mm Hg  Source: American Heart Association and American Stroke Association. For more on getting your blood  pressure under control, buy Controlling Your Blood Pressure, a Special Health Report from [facility].   Clonidine tablets What is this medicine? CLONIDINE (KLOE ni deen) is used to treat high blood pressure. This medicine may be used for other purposes; ask your health care provider or pharmacist if you have questions. COMMON BRAND NAME(S): Catapres What should I tell my health care provider before I take this medicine? They need to know if you have any of these conditions:  kidney disease  an unusual or allergic reaction to clonidine, other medicines, foods, dyes, or preservatives  pregnant or trying to get pregnant  breast-feeding How should I use this medicine? Take this medicine by mouth with a glass of water. Follow the directions on the prescription label. Take your doses at regular intervals. Do not take your medicine more often than directed. Do not suddenly stop taking this medicine. You must gradually reduce the dose or you may get a dangerous increase in blood pressure. Ask your doctor or health care professional for advice. Talk to your pediatrician regarding the use of  this medicine in children. Special care may be needed. Overdosage: If you think you have taken too much of this medicine contact a poison control center or emergency room at once. NOTE: This medicine is only for you. Do not share this medicine with others. What if I miss a dose? If you miss a dose, take it as soon as you can. If it is almost time for your next dose, take only that dose. Do not take double or extra doses. What may interact with this medicine? Do not take this medicine with any of the following medications:  MAOIs like Carbex, Eldepryl, Marplan, Nardil, and Parnate This medicine may also interact with the following medications:  barbiturate medicines for inducing sleep or treating seizures like phenobarbital  certain medicines for blood pressure, heart disease, irregular heart  beat  certain medicines for depression, anxiety, or psychotic disturbances  prescription pain medicines This list may not describe all possible interactions. Give your health care provider a list of all the medicines, herbs, non-prescription drugs, or dietary supplements you use. Also tell them if you smoke, drink alcohol, or use illegal drugs. Some items may interact with your medicine. What should I watch for while using this medicine? Visit your doctor or health care professional for regular checks on your progress. Check your heart rate and blood pressure regularly while you are taking this medicine. Ask your doctor or health care professional what your heart rate should be and when you should contact him or her. You may get drowsy or dizzy. Do not drive, use machinery, or do anything that needs mental alertness until you know how this medicine affects you. To avoid dizzy or fainting spells, do not stand or sit up quickly, especially if you are an older person. Alcohol can make you more drowsy and dizzy. Avoid alcoholic drinks. Your mouth may get dry. Chewing sugarless gum or sucking hard candy, and drinking plenty of water will help. Do not treat yourself for coughs, colds, or pain while you are taking this medicine without asking your doctor or health care professional for advice. Some ingredients may increase your blood pressure. If you are going to have surgery tell your doctor or health care professional that you are taking this medicine. What side effects may I notice from receiving this medicine? Side effects that you should report to your doctor or health care professional as soon as possible:  allergic reactions like skin rash, itching or hives, swelling of the face, lips, or tongue  anxiety, nervousness  chest pain  depression  fast, irregular heartbeat  swelling of feet or legs  unusually weak or tired Side effects that usually do not require medical attention (report to  your doctor or health care professional if they continue or are bothersome):  change in sex drive or performance  constipation  headache This list may not describe all possible side effects. Call your doctor for medical advice about side effects. You may report side effects to FDA at 1-800-FDA-1088. Where should I keep my medicine? Keep out of the reach of children. Store at room temperature between 15 and 30 degrees C (59 and 86 degrees F). Protect from light. Keep container tightly closed. Throw away any unused medicine after the expiration date. NOTE: This sheet is a summary. It may not cover all possible information. If you have questions about this medicine, talk to your doctor, pharmacist, or health care provider.  2020 Elsevier/Gold Standard (2010-12-27 13:01:28)

## 2019-05-22 DIAGNOSIS — I10 Essential (primary) hypertension: Secondary | ICD-10-CM | POA: Diagnosis not present

## 2019-05-27 ENCOUNTER — Telehealth: Payer: Self-pay | Admitting: Cardiology

## 2019-05-27 NOTE — Telephone Encounter (Signed)
Please call patient she states that she just got a call from [place]?? May be about Monitor results.

## 2019-05-28 NOTE — Telephone Encounter (Signed)
Called patient who states this message was a misunderstanding. Advised patient that her monitor results are not yet available and we will contact her with results when we have them. Patient is agreeable and verbalized understanding. No further questions.

## 2019-06-01 DIAGNOSIS — R002 Palpitations: Secondary | ICD-10-CM | POA: Diagnosis not present

## 2019-06-17 DIAGNOSIS — E1142 Type 2 diabetes mellitus with diabetic polyneuropathy: Secondary | ICD-10-CM | POA: Diagnosis not present

## 2019-06-17 DIAGNOSIS — E039 Hypothyroidism, unspecified: Secondary | ICD-10-CM | POA: Diagnosis not present

## 2019-06-17 DIAGNOSIS — I10 Essential (primary) hypertension: Secondary | ICD-10-CM | POA: Diagnosis not present

## 2019-06-17 DIAGNOSIS — E1165 Type 2 diabetes mellitus with hyperglycemia: Secondary | ICD-10-CM | POA: Diagnosis not present

## 2019-06-17 DIAGNOSIS — E785 Hyperlipidemia, unspecified: Secondary | ICD-10-CM | POA: Diagnosis not present

## 2019-06-17 DIAGNOSIS — M109 Gout, unspecified: Secondary | ICD-10-CM | POA: Diagnosis not present

## 2019-06-19 DIAGNOSIS — Z1231 Encounter for screening mammogram for malignant neoplasm of breast: Secondary | ICD-10-CM | POA: Diagnosis not present

## 2019-06-26 ENCOUNTER — Ambulatory Visit (INDEPENDENT_AMBULATORY_CARE_PROVIDER_SITE_OTHER): Payer: Medicare Other

## 2019-06-26 ENCOUNTER — Other Ambulatory Visit: Payer: Self-pay

## 2019-06-26 DIAGNOSIS — R002 Palpitations: Secondary | ICD-10-CM | POA: Diagnosis not present

## 2019-06-26 DIAGNOSIS — I10 Essential (primary) hypertension: Secondary | ICD-10-CM

## 2019-06-26 DIAGNOSIS — I350 Nonrheumatic aortic (valve) stenosis: Secondary | ICD-10-CM | POA: Diagnosis not present

## 2019-06-26 NOTE — Progress Notes (Signed)
  Echocardiogram 2D Echocardiogram has been performed.  Meredith Young [facility] 06/26/19

## 2019-07-13 NOTE — Progress Notes (Signed)
Cardiology Office Note:    Date:  07/14/2019   ID:  TELISSA SERFASS, DOB 1946-04-26, MRN [id number]  PCP:  Nicoletta Dress, MD  Cardiologist:  Shirlee More, MD    Referring MD: Nicoletta Dress, MD    ASSESSMENT:    1. Palpitations   2. Nonrheumatic aortic valve stenosis   3. Essential hypertension    PLAN:    In order of problems listed above:  1. Improved continue low-dose beta-blocker 2. Mild recent echocardiogram is reassuring reviewed the test with the patient  3. Stable BP at target continue current treatment including calcium channel blocker clonidine ARB beta-blocker.   Next appointment: 1 year   Medication Adjustments/Labs and Tests Ordered: Current medicines are reviewed at length with the patient today.  Concerns regarding medicines are outlined above.  No orders of the defined types were placed in this encounter.  No orders of the defined types were placed in this encounter.   Chief Complaint  Patient presents with  . Follow-up    after testing   . Palpitations    History of Present Illness:    Meredith Young is a 73 y.o. female with a hx of HTN with mild LVH , LAFB, hyperlipidemia and  mild AS  last seen 05/15/2019 for palpitation. Compliance with diet, lifestyle and medications: Yes he feels markedly improved she reduce the dose of her beta-blocker by 50% and since then fatigue malaise palpitation and dizziness have all resolved.  She has had no angina shortness of breath or syncope.  Recent labs show continued hyperlipidemia cholesterol 295 LDL 165 HDL 47 she is intolerant of lipid-lowering medications and has declined PCSK9 therapy.  Echo 06/26/2019:   1. Left ventricular ejection fraction, by visual estimation, is 55 to 60%. The left ventricle has normal function. There is moderately increased left ventricular hypertrophy.  2. Left ventricular diastolic parameters are consistent with Grade I diastolic dysfunction (impaired relaxation).  3.  The left ventricle demonstrates regional wall motion abnormalities.  4. Left atrial size was mildly dilated.  5. The mitral valve is normal in structure. No evidence of mitral valve regurgitation. No evidence of mitral stenosis.  6. The tricuspid valve is normal in structure. Tricuspid valve regurgitation is trivial.  7. The aortic valve is normal in structure. Aortic valve regurgitation is not visualized. Mild aortic valve stenosis.  Study Highlights  A ZIO monitor was performed for 7 days and 3 hours beginning on 05/15/2019 to assess palpitation.  The rhythm throughout was sinus with minimum average and maximum heart rates of 63, 86 and 126 bpm.  There were no pauses of 3 seconds or greater and no episodes of AV node or sinus node block.  Ventricular ectopy was rare with PVCs couplet and triplet.  There were no episodes of ventricular tachycardia.  Supraventricular arrhythmia was rare there were no episodes of atrial fibrillation or flutter.  There was one 4 beat run of atrial premature contractions that was asymptomatic.  There were 6 triggered events 5 showed sinus rhythm and one was associated with a single PVC.  There were 6 diary events lightheaded dizzy and headache.  One was associated with sinus rhythm 2 with sinus tachycardia 1 with frequent atrial premature contractions and 2 associated with PVCs and frequent one-on-one recording and 2 on the second.  In summary there was no complex arrhythmia noticed with triggered or symptomatic events.  Conclusion unremarkable 7-day monitor.  Inconsistent association of symptoms and triggered events with infrequent ventricular  and atrial arrhythmia.    Past Medical History:  Diagnosis Date  . Ankle fracture, left, closed, initial encounter 02/02/2014  . Anxiety 04/28/2014  . Aortic stenosis 02/24/2015   Overview:  Very mild Nov 2015  . Carotid artery occlusion   . Carotid stenosis, bilateral 02/27/2016   Overview:  R ICA > 70%L ICA  50-69%  . Cerebrovascular disease 04/28/2014  . Chronic right shoulder pain 07/26/2016  . Complication of anesthesia   . Depression 04/28/2014  . Diabetes mellitus type II, controlled ([place]) 04/28/2014  . Diabetes mellitus without complication ([place])   . Diverticulitis of colon   . Essential hypertension 04/28/2014  . GERD (gastroesophageal reflux disease)   . Gout   . Gout   . Headache   . History of colonic polyps 04/28/2014  . History of hiatal hernia   . Hx of colonic polyps   . Hyperlipidemia   . Hypertension    HEART BEATS FAST   . Hypothyroidism   . Idiopathic peripheral neuropathy   . Ingrown nail of great toe of left foot 02/11/2017  . Ingrown nail of great toe of right foot 01/04/2017  . Low back pain 04/28/2014  . OA (osteoarthritis) 04/28/2014  . Occlusion and stenosis of carotid artery without mention of cerebral infarction 11/25/2012  . Osteoarthritis   . Palpitation 01/27/2016  . Paronychia of great toe, left 02/11/2017  . Paronychia of great toe, right 01/04/2017  . Pernicious anemia 04/28/2014  . PONV (postoperative nausea and vomiting)   . RLS (restless legs syndrome) 04/28/2014  . Rosacea 04/28/2014  . S/P rotator cuff repair 09/17/2016  . TIA (transient ischemic attack)    01/2016    Past Surgical History:  Procedure Laterality Date  . ABDOMINAL HYSTERECTOMY    . ANKLE SURGERY     LEFT  . APPENDECTOMY    . CHOLECYSTECTOMY  1991  . ENDARTERECTOMY Right 02/27/2016   Procedure: RIGHT ENDARTERECTOMY CAROTID;  Surgeon: Waynetta Sandy, MD;  Location: [place];  Service: Vascular;  Laterality: Right;  . NASAL SINUS SURGERY  1975  . PATCH ANGIOPLASTY Right 02/27/2016   Procedure: PATCH ANGIOPLASTY USING Rueben Bash BIOLOGIC PATCH;  Surgeon: Waynetta Sandy, MD;  Location: [place];  Service: Vascular;  Laterality: Right;  . POLYPECTOMY  1985  . TOOTH EXTRACTION      Current Medications: Current Meds  Medication Sig  . allopurinol (ZYLOPRIM) 300 MG  tablet Take 300 mg by mouth daily.  Marland Kitchen amLODipine (NORVASC) 5 MG tablet Take 5 mg by mouth daily.  Marland Kitchen aspirin EC 81 MG tablet Take 81 mg by mouth daily.  . cloNIDine (CATAPRES) 0.1 MG tablet Take 1 tablet (0.1 mg) daily as needed if your systolic BP (top number) is greater than 180.  . Dulaglutide (TRULICITY) 1.5 0000000 SOPN Inject into the skin once a week.  . fexofenadine (ALLEGRA) 180 MG tablet Take 180 mg by mouth daily as needed for allergies or rhinitis.  . folic acid (FOLVITE) 1 MG tablet Take 1 mg by mouth 2 (two) times daily.  Marland Kitchen glimepiride (AMARYL) 1 MG tablet Take 1 mg by mouth daily.  Marland Kitchen levothyroxine (SYNTHROID) 88 MCG tablet Take 88 mcg by mouth daily before breakfast.  . losartan (COZAAR) 100 MG tablet Take 100 mg by mouth daily.  . metoprolol tartrate (LOPRESSOR) 25 MG tablet Take 12.5 mg by mouth 2 (two) times daily.  . Multiple Vitamins-Minerals (MULTIVITAMIN PO) Take 1 tablet by mouth daily.   Marland Kitchen omeprazole (PRILOSEC) 20 MG  capsule Take 20 mg by mouth daily.  Marland Kitchen telmisartan (MICARDIS) 80 MG tablet Take 80 mg by mouth daily as needed.     Allergies:   Statins, Codeine, Demerol [meperidine], Diltiazem, Metformin, and Metformin and related   Social History   Socioeconomic History  . Marital status: Single    Spouse name: Not on file  . Number of children: Not on file  . Years of education: Not on file  . Highest education level: Not on file  Occupational History  . Not on file  Tobacco Use  . Smoking status: Former Smoker    Start date: 07/16/1988  . Smokeless tobacco: Never Used  Substance and Sexual Activity  . Alcohol use: No  . Drug use: No  . Sexual activity: Not on file  Other Topics Concern  . Not on file  Social History Narrative  . Not on file   Social Determinants of Health   Financial Resource Strain:   . Difficulty of Paying Living Expenses: Not on file  Food Insecurity:   . Worried About Charity fundraiser in the Last Year: Not on file  . Ran  Out of Food in the Last Year: Not on file  Transportation Needs:   . Lack of Transportation (Medical): Not on file  . Lack of Transportation (Non-Medical): Not on file  Physical Activity:   . Days of Exercise per Week: Not on file  . Minutes of Exercise per Session: Not on file  Stress:   . Feeling of Stress : Not on file  Social Connections:   . Frequency of Communication with Friends and Family: Not on file  . Frequency of Social Gatherings with Friends and Family: Not on file  . Attends Religious Services: Not on file  . Active Member of Clubs or Organizations: Not on file  . Attends Archivist Meetings: Not on file  . Marital Status: Not on file     Family History: The patient's family history includes COPD in her mother; Diabetes in her father and sister; Heart disease in her father and mother; Hyperlipidemia in her father; Hypertension in her father and mother; Other in her father; Stroke in her father. ROS:   Please see the history of present illness.    All other systems reviewed and are negative.  EKGs/Labs/Other Studies Reviewed:    The following studies were reviewed today:  EKG:  EKG ordered today and personally reviewed.  The ekg ordered today demonstrates sinus rhythm 1 PVC left axis deviation  Recent Labs: No results found for requested labs within last 8760 hours.  Recent Lipid Panel No results found for: CHOL, TRIG, HDL, CHOLHDL, VLDL, LDLCALC, LDLDIRECT  Physical Exam:    VS:  Pulse 97   Ht 5\' 3"  (1.6 m)   Wt 159 lb 3.2 oz (72.2 kg)   BMI 28.20 kg/m     Wt Readings from Last 3 Encounters:  07/14/19 159 lb 3.2 oz (72.2 kg)  05/15/19 160 lb (72.6 kg)  07/18/18 153 lb (69.4 kg)     GEN:  Well nourished, well developed in no acute distress HEENT: Normal NECK: No JVD; No carotid bruits LYMPHATICS: No lymphadenopathy CARDIAC: RRR, no murmurs, rubs, gallops RESPIRATORY:  Clear to auscultation without rales, wheezing or rhonchi  ABDOMEN:  Soft, non-tender, non-distended MUSCULOSKELETAL:  No edema; No deformity  SKIN: Warm and dry NEUROLOGIC:  Alert and oriented x 3 PSYCHIATRIC:  Normal affect    Signed, Shirlee More, MD  07/14/2019 1:33  PM    [facility]

## 2019-07-14 ENCOUNTER — Other Ambulatory Visit: Payer: Self-pay

## 2019-07-14 ENCOUNTER — Ambulatory Visit (INDEPENDENT_AMBULATORY_CARE_PROVIDER_SITE_OTHER): Payer: Medicare Other | Admitting: Cardiology

## 2019-07-14 ENCOUNTER — Encounter: Payer: Self-pay | Admitting: Cardiology

## 2019-07-14 VITALS — BP 114/60 | HR 97 | Ht 63.0 in | Wt 159.2 lb

## 2019-07-14 DIAGNOSIS — I350 Nonrheumatic aortic (valve) stenosis: Secondary | ICD-10-CM | POA: Diagnosis not present

## 2019-07-14 DIAGNOSIS — I10 Essential (primary) hypertension: Secondary | ICD-10-CM

## 2019-07-14 DIAGNOSIS — R002 Palpitations: Secondary | ICD-10-CM | POA: Diagnosis not present

## 2019-07-14 NOTE — Patient Instructions (Signed)
Medication Instructions:  Your physician recommends that you continue on your current medications as directed. Please refer to the Current Medication list given to you today.  *If you need a refill on your cardiac medications before your next appointment, please call your pharmacy*  Lab Work: NONE If you have labs (blood work) drawn today and your tests are completely normal, you will receive your results only by: Marland Kitchen MyChart Message (if you have MyChart) OR . A paper copy in the mail If you have any lab test that is abnormal or we need to change your treatment, we will call you to review the results.  Testing/Procedures: You had an EKG Performed today  Follow-Up: At [facility], you and your health needs are our priority.  As part of our continuing mission to provide you with exceptional heart care, we have created designated Provider Care Teams.  These Care Teams include your primary Cardiologist (physician) and Advanced Practice Providers (APPs -  Physician Assistants and Nurse Practitioners) who all work together to provide you with the care you need, when you need it.  Your next appointment:   1 year(s)  The format for your next appointment:   In Person  Provider:   Shirlee More, MD

## 2019-09-09 DIAGNOSIS — B9689 Other specified bacterial agents as the cause of diseases classified elsewhere: Secondary | ICD-10-CM | POA: Diagnosis not present

## 2019-09-09 DIAGNOSIS — J019 Acute sinusitis, unspecified: Secondary | ICD-10-CM | POA: Diagnosis not present

## 2019-09-10 DIAGNOSIS — L219 Seborrheic dermatitis, unspecified: Secondary | ICD-10-CM | POA: Diagnosis not present

## 2019-09-10 DIAGNOSIS — L209 Atopic dermatitis, unspecified: Secondary | ICD-10-CM | POA: Diagnosis not present

## 2019-09-22 DIAGNOSIS — I10 Essential (primary) hypertension: Secondary | ICD-10-CM | POA: Diagnosis not present

## 2019-09-22 DIAGNOSIS — E785 Hyperlipidemia, unspecified: Secondary | ICD-10-CM | POA: Diagnosis not present

## 2019-09-22 DIAGNOSIS — E039 Hypothyroidism, unspecified: Secondary | ICD-10-CM | POA: Diagnosis not present

## 2019-09-22 DIAGNOSIS — E1165 Type 2 diabetes mellitus with hyperglycemia: Secondary | ICD-10-CM | POA: Diagnosis not present

## 2019-09-22 DIAGNOSIS — E1142 Type 2 diabetes mellitus with diabetic polyneuropathy: Secondary | ICD-10-CM | POA: Diagnosis not present

## 2019-09-22 DIAGNOSIS — M109 Gout, unspecified: Secondary | ICD-10-CM | POA: Diagnosis not present

## 2019-09-30 DIAGNOSIS — J208 Acute bronchitis due to other specified organisms: Secondary | ICD-10-CM | POA: Diagnosis not present

## 2019-09-30 DIAGNOSIS — H04123 Dry eye syndrome of bilateral lacrimal glands: Secondary | ICD-10-CM | POA: Diagnosis not present

## 2019-09-30 DIAGNOSIS — E119 Type 2 diabetes mellitus without complications: Secondary | ICD-10-CM | POA: Diagnosis not present

## 2019-09-30 DIAGNOSIS — H40003 Preglaucoma, unspecified, bilateral: Secondary | ICD-10-CM | POA: Diagnosis not present

## 2019-09-30 DIAGNOSIS — Z961 Presence of intraocular lens: Secondary | ICD-10-CM | POA: Diagnosis not present

## 2019-09-30 DIAGNOSIS — B9689 Other specified bacterial agents as the cause of diseases classified elsewhere: Secondary | ICD-10-CM | POA: Diagnosis not present

## 2019-10-24 DIAGNOSIS — L508 Other urticaria: Secondary | ICD-10-CM | POA: Diagnosis not present

## 2019-10-27 DIAGNOSIS — L578 Other skin changes due to chronic exposure to nonionizing radiation: Secondary | ICD-10-CM | POA: Diagnosis not present

## 2019-10-27 DIAGNOSIS — L300 Nummular dermatitis: Secondary | ICD-10-CM | POA: Diagnosis not present

## 2019-10-27 DIAGNOSIS — L219 Seborrheic dermatitis, unspecified: Secondary | ICD-10-CM | POA: Diagnosis not present

## 2019-12-08 DIAGNOSIS — L821 Other seborrheic keratosis: Secondary | ICD-10-CM | POA: Diagnosis not present

## 2019-12-08 DIAGNOSIS — L300 Nummular dermatitis: Secondary | ICD-10-CM | POA: Diagnosis not present

## 2019-12-09 DIAGNOSIS — N390 Urinary tract infection, site not specified: Secondary | ICD-10-CM | POA: Diagnosis not present

## 2019-12-09 DIAGNOSIS — Z6827 Body mass index (BMI) 27.0-27.9, adult: Secondary | ICD-10-CM | POA: Diagnosis not present

## 2019-12-29 DIAGNOSIS — E039 Hypothyroidism, unspecified: Secondary | ICD-10-CM | POA: Diagnosis not present

## 2019-12-29 DIAGNOSIS — I10 Essential (primary) hypertension: Secondary | ICD-10-CM | POA: Diagnosis not present

## 2019-12-29 DIAGNOSIS — E1142 Type 2 diabetes mellitus with diabetic polyneuropathy: Secondary | ICD-10-CM | POA: Diagnosis not present

## 2019-12-29 DIAGNOSIS — M109 Gout, unspecified: Secondary | ICD-10-CM | POA: Diagnosis not present

## 2019-12-29 DIAGNOSIS — E785 Hyperlipidemia, unspecified: Secondary | ICD-10-CM | POA: Diagnosis not present

## 2019-12-29 DIAGNOSIS — E1165 Type 2 diabetes mellitus with hyperglycemia: Secondary | ICD-10-CM | POA: Diagnosis not present

## 2020-01-22 DIAGNOSIS — M5431 Sciatica, right side: Secondary | ICD-10-CM | POA: Diagnosis not present

## 2020-02-22 DIAGNOSIS — H44002 Unspecified purulent endophthalmitis, left eye: Secondary | ICD-10-CM | POA: Diagnosis not present

## 2020-03-10 DIAGNOSIS — B9689 Other specified bacterial agents as the cause of diseases classified elsewhere: Secondary | ICD-10-CM | POA: Diagnosis not present

## 2020-03-10 DIAGNOSIS — N760 Acute vaginitis: Secondary | ICD-10-CM | POA: Diagnosis not present

## 2020-03-10 DIAGNOSIS — B373 Candidiasis of vulva and vagina: Secondary | ICD-10-CM | POA: Diagnosis not present

## 2020-03-10 DIAGNOSIS — R399 Unspecified symptoms and signs involving the genitourinary system: Secondary | ICD-10-CM | POA: Diagnosis not present

## 2020-03-29 DIAGNOSIS — I10 Essential (primary) hypertension: Secondary | ICD-10-CM | POA: Diagnosis not present

## 2020-03-29 DIAGNOSIS — Z139 Encounter for screening, unspecified: Secondary | ICD-10-CM | POA: Diagnosis not present

## 2020-03-29 DIAGNOSIS — F419 Anxiety disorder, unspecified: Secondary | ICD-10-CM | POA: Diagnosis not present

## 2020-03-29 DIAGNOSIS — M109 Gout, unspecified: Secondary | ICD-10-CM | POA: Diagnosis not present

## 2020-03-29 DIAGNOSIS — E1142 Type 2 diabetes mellitus with diabetic polyneuropathy: Secondary | ICD-10-CM | POA: Diagnosis not present

## 2020-03-29 DIAGNOSIS — E039 Hypothyroidism, unspecified: Secondary | ICD-10-CM | POA: Diagnosis not present

## 2020-03-29 DIAGNOSIS — E1165 Type 2 diabetes mellitus with hyperglycemia: Secondary | ICD-10-CM | POA: Diagnosis not present

## 2020-03-29 DIAGNOSIS — Z79899 Other long term (current) drug therapy: Secondary | ICD-10-CM | POA: Diagnosis not present

## 2020-03-29 DIAGNOSIS — E785 Hyperlipidemia, unspecified: Secondary | ICD-10-CM | POA: Diagnosis not present

## 2020-03-29 DIAGNOSIS — Z8673 Personal history of transient ischemic attack (TIA), and cerebral infarction without residual deficits: Secondary | ICD-10-CM | POA: Diagnosis not present

## 2020-04-29 DIAGNOSIS — Z23 Encounter for immunization: Secondary | ICD-10-CM | POA: Diagnosis not present

## 2020-04-30 DIAGNOSIS — R399 Unspecified symptoms and signs involving the genitourinary system: Secondary | ICD-10-CM | POA: Diagnosis not present

## 2020-05-19 ENCOUNTER — Other Ambulatory Visit: Payer: Self-pay

## 2020-05-19 DIAGNOSIS — I6521 Occlusion and stenosis of right carotid artery: Secondary | ICD-10-CM

## 2020-05-19 DIAGNOSIS — I6523 Occlusion and stenosis of bilateral carotid arteries: Secondary | ICD-10-CM

## 2020-05-25 DIAGNOSIS — N390 Urinary tract infection, site not specified: Secondary | ICD-10-CM | POA: Diagnosis not present

## 2020-05-25 DIAGNOSIS — Z79899 Other long term (current) drug therapy: Secondary | ICD-10-CM | POA: Diagnosis not present

## 2020-05-25 DIAGNOSIS — N3946 Mixed incontinence: Secondary | ICD-10-CM | POA: Diagnosis not present

## 2020-05-25 DIAGNOSIS — N952 Postmenopausal atrophic vaginitis: Secondary | ICD-10-CM | POA: Diagnosis not present

## 2020-05-27 ENCOUNTER — Ambulatory Visit: Payer: Medicare Other | Admitting: Vascular Surgery

## 2020-05-27 ENCOUNTER — Encounter (HOSPITAL_COMMUNITY): Payer: Medicare Other

## 2020-06-03 DIAGNOSIS — H00015 Hordeolum externum left lower eyelid: Secondary | ICD-10-CM | POA: Diagnosis not present

## 2020-06-13 DIAGNOSIS — J208 Acute bronchitis due to other specified organisms: Secondary | ICD-10-CM | POA: Diagnosis not present

## 2020-06-13 DIAGNOSIS — B9689 Other specified bacterial agents as the cause of diseases classified elsewhere: Secondary | ICD-10-CM | POA: Diagnosis not present

## 2020-06-28 DIAGNOSIS — M109 Gout, unspecified: Secondary | ICD-10-CM | POA: Diagnosis not present

## 2020-06-28 DIAGNOSIS — E1165 Type 2 diabetes mellitus with hyperglycemia: Secondary | ICD-10-CM | POA: Diagnosis not present

## 2020-06-28 DIAGNOSIS — E1142 Type 2 diabetes mellitus with diabetic polyneuropathy: Secondary | ICD-10-CM | POA: Diagnosis not present

## 2020-06-28 DIAGNOSIS — I10 Essential (primary) hypertension: Secondary | ICD-10-CM | POA: Diagnosis not present

## 2020-06-28 DIAGNOSIS — E785 Hyperlipidemia, unspecified: Secondary | ICD-10-CM | POA: Diagnosis not present

## 2020-06-28 DIAGNOSIS — F419 Anxiety disorder, unspecified: Secondary | ICD-10-CM | POA: Diagnosis not present

## 2020-06-28 DIAGNOSIS — Z8673 Personal history of transient ischemic attack (TIA), and cerebral infarction without residual deficits: Secondary | ICD-10-CM | POA: Diagnosis not present

## 2020-06-28 DIAGNOSIS — Z1231 Encounter for screening mammogram for malignant neoplasm of breast: Secondary | ICD-10-CM | POA: Diagnosis not present

## 2020-06-28 DIAGNOSIS — E039 Hypothyroidism, unspecified: Secondary | ICD-10-CM | POA: Diagnosis not present

## 2020-07-04 DIAGNOSIS — G459 Transient cerebral ischemic attack, unspecified: Secondary | ICD-10-CM | POA: Insufficient documentation

## 2020-07-04 DIAGNOSIS — T8859XA Other complications of anesthesia, initial encounter: Secondary | ICD-10-CM | POA: Insufficient documentation

## 2020-07-04 DIAGNOSIS — I6529 Occlusion and stenosis of unspecified carotid artery: Secondary | ICD-10-CM | POA: Insufficient documentation

## 2020-07-04 DIAGNOSIS — Z8719 Personal history of other diseases of the digestive system: Secondary | ICD-10-CM | POA: Insufficient documentation

## 2020-07-04 DIAGNOSIS — I10 Essential (primary) hypertension: Secondary | ICD-10-CM | POA: Insufficient documentation

## 2020-07-04 DIAGNOSIS — Z9889 Other specified postprocedural states: Secondary | ICD-10-CM | POA: Insufficient documentation

## 2020-07-04 DIAGNOSIS — E119 Type 2 diabetes mellitus without complications: Secondary | ICD-10-CM | POA: Insufficient documentation

## 2020-07-04 DIAGNOSIS — K5732 Diverticulitis of large intestine without perforation or abscess without bleeding: Secondary | ICD-10-CM | POA: Insufficient documentation

## 2020-07-04 DIAGNOSIS — Z86010 Personal history of colonic polyps: Secondary | ICD-10-CM | POA: Insufficient documentation

## 2020-07-04 DIAGNOSIS — R519 Headache, unspecified: Secondary | ICD-10-CM | POA: Insufficient documentation

## 2020-07-04 DIAGNOSIS — G609 Hereditary and idiopathic neuropathy, unspecified: Secondary | ICD-10-CM | POA: Insufficient documentation

## 2020-07-04 DIAGNOSIS — M1990 Unspecified osteoarthritis, unspecified site: Secondary | ICD-10-CM | POA: Insufficient documentation

## 2020-07-04 DIAGNOSIS — M109 Gout, unspecified: Secondary | ICD-10-CM | POA: Insufficient documentation

## 2020-07-24 NOTE — Progress Notes (Signed)
Cardiology Office Note:    Date:  07/26/2020   ID:  Meredith Young, DOB [birth year], MRN [id number]  PCP:  Nicoletta Dress, MD  Cardiologist:  Shirlee More, MD    Referring MD: Nicoletta Dress, MD    ASSESSMENT:    1. Nonrheumatic aortic valve stenosis   2. Essential hypertension   3. Left anterior fascicular hemiblock   4. Hyperlipidemia, unspecified hyperlipidemia type    PLAN:    In order of problems listed above:  1. Clinically stable recheck echocardiogram after the current surge of COVID in April.  I doubt she is progress for consideration of correction asymptomatic 2. Stable repeat blood pressure by me 136/66 New current treatment including multiple drugs clonidine calcium channel blocker amlodipine and ARB Poorly controlled hyperlipidemia statin intolerant and she has refused PCSK9 therapy  Next appointment: 6 months   Medication Adjustments/Labs and Tests Ordered: Current medicines are reviewed at length with the patient today.  Concerns regarding medicines are outlined above.  Orders Placed This Encounter  Procedures  . EKG 12-Lead  . ECHOCARDIOGRAM COMPLETE   No orders of the defined types were placed in this encounter.   Chief Complaint  Patient presents with  . Follow-up  . Hypertension  . Aortic Stenosis    History of Present Illness:    Meredith Young is a 75 y.o. 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  Last echocardiogram 06/26/2019 mild stenosis mean gradient 18 mmHg.  She was last seen 07/14/2019 and improved with a lower dose beta-blocker.  Severe dyslipidemia intolerant of statins and declined PCSK9 therapy after discussion of benefits risk and option. Compliance with diet, lifestyle and medications: Yes  She is very fatigued with her mother's illness being on hospice and the demands placed upon her.  No chest pain shortness of breath  palpitation or syncope.  We will plan on doing a follow-up echocardiogram after the surge of COVID in April.  Recent labs at PCP office 06/29/2019 shows cholesterol 277 LDL 182 triglycerides 267 HDL 44 creatinine 1.57.  EKG stable pattern sinus rhythm Past Medical History:  Diagnosis Date  . Ankle fracture, left, closed, initial encounter 02/02/2014  . Anxiety 04/28/2014  . Aortic stenosis 02/24/2015   Overview:  Very mild Nov 2015  . Carotid artery occlusion   . Carotid stenosis, bilateral 02/27/2016   Overview:  R ICA > 70%L ICA 50-69%  . Cerebrovascular disease 04/28/2014  . Chronic right shoulder pain 07/26/2016  . Complication of anesthesia   . Depression 04/28/2014  . Diabetes mellitus type II, controlled ([place]) 04/28/2014  . Diabetes mellitus without complication ([place])   . Diverticulitis of colon   . Essential hypertension 04/28/2014  . GERD (gastroesophageal reflux disease)   . Gout   . Gout   . Headache   . History of colonic polyps 04/28/2014  . History of hiatal hernia   . Hx of colonic polyps   . Hyperlipidemia   . Hypertension    HEART BEATS FAST   . Hypothyroidism   . Idiopathic peripheral neuropathy   . Ingrown nail of great toe of left foot 02/11/2017  . Ingrown nail of great toe of right foot 01/04/2017  . Low back pain 04/28/2014  . OA (osteoarthritis) 04/28/2014  . Occlusion and stenosis of carotid artery without mention of cerebral infarction 11/25/2012  . Osteoarthritis   . Palpitation 01/27/2016  . Paronychia of great  toe, left 02/11/2017  . Paronychia of great toe, right 01/04/2017  . Pernicious anemia 04/28/2014  . PONV (postoperative nausea and vomiting)   . RLS (restless legs syndrome) 04/28/2014  . Rosacea 04/28/2014  . S/P rotator cuff repair 09/17/2016  . TIA (transient ischemic attack)    01/2016    Past Surgical History:  Procedure Laterality Date  . ABDOMINAL HYSTERECTOMY    . ANKLE SURGERY     LEFT  . APPENDECTOMY    . CHOLECYSTECTOMY  1991  .  ENDARTERECTOMY Right 02/27/2016   Procedure: RIGHT ENDARTERECTOMY CAROTID;  Surgeon: Waynetta Sandy, MD;  Location: [place];  Service: Vascular;  Laterality: Right;  . NASAL SINUS SURGERY  1975  . PATCH ANGIOPLASTY Right 02/27/2016   Procedure: PATCH ANGIOPLASTY USING Rueben Bash BIOLOGIC PATCH;  Surgeon: Waynetta Sandy, MD;  Location: [place];  Service: Vascular;  Laterality: Right;  . POLYPECTOMY  1985  . TOOTH EXTRACTION      Current Medications: Current Meds  Medication Sig  . allopurinol (ZYLOPRIM) 300 MG tablet Take 300 mg by mouth daily.  Marland Kitchen amLODipine (NORVASC) 5 MG tablet Take 5 mg by mouth daily.  Marland Kitchen aspirin EC 81 MG tablet Take 81 mg by mouth daily.  . cloNIDine (CATAPRES) 0.1 MG tablet Take 1 tablet (0.1 mg) daily as needed if your systolic BP (top number) is greater than 180.  . Dulaglutide (TRULICITY) 1.5 0000000 SOPN Inject into the skin once a week.  . fexofenadine (ALLEGRA) 180 MG tablet Take 180 mg by mouth daily as needed for allergies or rhinitis.  . folic acid (FOLVITE) 1 MG tablet Take 1 mg by mouth 2 (two) times daily.  Marland Kitchen glimepiride (AMARYL) 1 MG tablet Take 1 mg by mouth daily as needed.  Marland Kitchen levothyroxine (SYNTHROID) 88 MCG tablet Take 88 mcg by mouth daily before breakfast.  . LORazepam (ATIVAN) 0.5 MG tablet Take 0.5 mg by mouth 2 (two) times daily as needed.  Marland Kitchen losartan (COZAAR) 100 MG tablet Take 100 mg by mouth daily.  . metoprolol tartrate (LOPRESSOR) 25 MG tablet Take 12.5 mg by mouth 2 (two) times daily.  . Multiple Vitamins-Minerals (MULTIVITAMIN PO) Take 1 tablet by mouth daily.   Marland Kitchen omeprazole (PRILOSEC) 20 MG capsule Take 20 mg by mouth daily.  Marland Kitchen telmisartan (MICARDIS) 80 MG tablet Take 80 mg by mouth daily as needed.     Allergies:   Statins, Codeine, Demerol [meperidine], Diltiazem, Metformin, and Metformin and related   Social History   Socioeconomic History  . Marital status: Single    Spouse name: Not on file  . Number of children:  Not on file  . Years of education: Not on file  . Highest education level: Not on file  Occupational History  . Not on file  Tobacco Use  . Smoking status: Former Smoker    Start date: 07/16/1988  . Smokeless tobacco: Never Used  Vaping Use  . Vaping Use: Never used  Substance and Sexual Activity  . Alcohol use: No  . Drug use: No  . Sexual activity: Not on file  Other Topics Concern  . Not on file  Social History Narrative  . Not on file   Social Determinants of Health   Financial Resource Strain: Not on file  Food Insecurity: Not on file  Transportation Needs: Not on file  Physical Activity: Not on file  Stress: Not on file  Social Connections: Not on file     Family History: The patient's family  history includes COPD in her mother; Diabetes in her father and sister; Heart disease in her father and mother; Hyperlipidemia in her father; Hypertension in her father and mother; Other in her father; Stroke in her father. ROS:   Please see the history of present illness.    All other systems reviewed and are negative.  EKGs/Labs/Other Studies Reviewed:    The following studies were reviewed today:  EKG:  EKG ordered today and personally reviewed.  The ekg ordered today demonstrates sinus rhythm left axis deviation left anterior hemiblock unchanged 07/14/2019   Physical Exam:    VS:  BP (!) 150/70   Pulse 88   Ht 5\' 3"  (1.6 m)   Wt 152 lb (68.9 kg)   SpO2 98%   BMI 26.93 kg/m     Wt Readings from Last 3 Encounters:  07/26/20 152 lb (68.9 kg)  07/14/19 159 lb 3.2 oz (72.2 kg)  05/15/19 160 lb (72.6 kg)     GEN:  Well nourished, well developed in no acute distress HEENT: Normal NECK: No JVD; No carotid bruits LYMPHATICS: No lymphadenopathy CARDIAC: RRR, no , rubs, gallops grade 1-2 midsystolic AS murmur soft localized the aortic area without radiation S2 normal RESPIRATORY:  Clear to auscultation without rales, wheezing or rhonchi  ABDOMEN: Soft, non-tender,  non-distended MUSCULOSKELETAL:  No edema; No deformity  SKIN: Warm and dry NEUROLOGIC:  Alert and oriented x 3 PSYCHIATRIC:  Normal affect    Signed, Shirlee More, MD  07/26/2020 2:31 PM    [place]

## 2020-07-26 ENCOUNTER — Encounter: Payer: Self-pay | Admitting: Cardiology

## 2020-07-26 ENCOUNTER — Ambulatory Visit (INDEPENDENT_AMBULATORY_CARE_PROVIDER_SITE_OTHER): Payer: Medicare Other | Admitting: Cardiology

## 2020-07-26 ENCOUNTER — Other Ambulatory Visit: Payer: Self-pay

## 2020-07-26 VITALS — BP 150/70 | HR 88 | Ht 63.0 in | Wt 152.0 lb

## 2020-07-26 DIAGNOSIS — I350 Nonrheumatic aortic (valve) stenosis: Secondary | ICD-10-CM | POA: Diagnosis not present

## 2020-07-26 DIAGNOSIS — I10 Essential (primary) hypertension: Secondary | ICD-10-CM

## 2020-07-26 DIAGNOSIS — E785 Hyperlipidemia, unspecified: Secondary | ICD-10-CM | POA: Diagnosis not present

## 2020-07-26 DIAGNOSIS — I444 Left anterior fascicular block: Secondary | ICD-10-CM | POA: Diagnosis not present

## 2020-07-26 NOTE — Patient Instructions (Signed)
Medication Instructions:  Your physician recommends that you continue on your current medications as directed. Please refer to the Current Medication list given to you today.  *If you need a refill on your cardiac medications before your next appointment, please call your pharmacy*   Lab Work: None If you have labs (blood work) drawn today and your tests are completely normal, you will receive your results only by: Marland Kitchen MyChart Message (if you have MyChart) OR . A paper copy in the mail If you have any lab test that is abnormal or we need to change your treatment, we will call you to review the results.   Testing/Procedures: Your physician has requested that you have an echocardiogram in April. Echocardiography is a painless test that uses sound waves to create images of your heart. It provides your doctor with information about the size and shape of your heart and how well your heart's chambers and valves are working. This procedure takes approximately one hour. There are no restrictions for this procedure.     Follow-Up: At [facility], you and your health needs are our priority.  As part of our continuing mission to provide you with exceptional heart care, we have created designated Provider Care Teams.  These Care Teams include your primary Cardiologist (physician) and Advanced Practice Providers (APPs -  Physician Assistants and Nurse Practitioners) who all work together to provide you with the care you need, when you need it.  We recommend signing up for the patient portal called "MyChart".  Sign up information is provided on this After Visit Summary.  MyChart is used to connect with patients for Virtual Visits (Telemedicine).  Patients are able to view lab/test results, encounter notes, upcoming appointments, etc.  Non-urgent messages can be sent to your provider as well.   To learn more about what you can do with MyChart, go to [email or web address].    Your next appointment:   6  month(s)  The format for your next appointment:   In Person  Provider:   Shirlee More, MD   Other Instructions

## 2020-09-05 DIAGNOSIS — H00031 Abscess of right upper eyelid: Secondary | ICD-10-CM | POA: Diagnosis not present

## 2020-09-15 DIAGNOSIS — M545 Low back pain, unspecified: Secondary | ICD-10-CM | POA: Diagnosis not present

## 2020-09-22 DIAGNOSIS — Z9181 History of falling: Secondary | ICD-10-CM | POA: Diagnosis not present

## 2020-09-22 DIAGNOSIS — Z0000 Encounter for general adult medical examination without abnormal findings: Secondary | ICD-10-CM | POA: Diagnosis not present

## 2020-09-22 DIAGNOSIS — E785 Hyperlipidemia, unspecified: Secondary | ICD-10-CM | POA: Diagnosis not present

## 2020-09-22 DIAGNOSIS — Z1331 Encounter for screening for depression: Secondary | ICD-10-CM | POA: Diagnosis not present

## 2020-09-27 DIAGNOSIS — I10 Essential (primary) hypertension: Secondary | ICD-10-CM | POA: Diagnosis not present

## 2020-09-27 DIAGNOSIS — E1165 Type 2 diabetes mellitus with hyperglycemia: Secondary | ICD-10-CM | POA: Diagnosis not present

## 2020-09-27 DIAGNOSIS — E785 Hyperlipidemia, unspecified: Secondary | ICD-10-CM | POA: Diagnosis not present

## 2020-09-27 DIAGNOSIS — Z8673 Personal history of transient ischemic attack (TIA), and cerebral infarction without residual deficits: Secondary | ICD-10-CM | POA: Diagnosis not present

## 2020-09-27 DIAGNOSIS — Z1231 Encounter for screening mammogram for malignant neoplasm of breast: Secondary | ICD-10-CM | POA: Diagnosis not present

## 2020-09-27 DIAGNOSIS — E039 Hypothyroidism, unspecified: Secondary | ICD-10-CM | POA: Diagnosis not present

## 2020-09-27 DIAGNOSIS — F419 Anxiety disorder, unspecified: Secondary | ICD-10-CM | POA: Diagnosis not present

## 2020-09-27 DIAGNOSIS — E1142 Type 2 diabetes mellitus with diabetic polyneuropathy: Secondary | ICD-10-CM | POA: Diagnosis not present

## 2020-09-27 DIAGNOSIS — M109 Gout, unspecified: Secondary | ICD-10-CM | POA: Diagnosis not present

## 2020-10-05 DIAGNOSIS — E119 Type 2 diabetes mellitus without complications: Secondary | ICD-10-CM | POA: Diagnosis not present

## 2020-10-05 DIAGNOSIS — H40013 Open angle with borderline findings, low risk, bilateral: Secondary | ICD-10-CM | POA: Diagnosis not present

## 2020-10-17 DIAGNOSIS — Z1231 Encounter for screening mammogram for malignant neoplasm of breast: Secondary | ICD-10-CM | POA: Diagnosis not present

## 2020-10-20 DIAGNOSIS — H35342 Macular cyst, hole, or pseudohole, left eye: Secondary | ICD-10-CM | POA: Diagnosis not present

## 2020-10-20 DIAGNOSIS — H35372 Puckering of macula, left eye: Secondary | ICD-10-CM | POA: Diagnosis not present

## 2020-10-24 ENCOUNTER — Other Ambulatory Visit: Payer: Medicare Other

## 2020-11-02 DIAGNOSIS — B9689 Other specified bacterial agents as the cause of diseases classified elsewhere: Secondary | ICD-10-CM | POA: Diagnosis not present

## 2020-11-02 DIAGNOSIS — J019 Acute sinusitis, unspecified: Secondary | ICD-10-CM | POA: Diagnosis not present

## 2020-11-02 DIAGNOSIS — J208 Acute bronchitis due to other specified organisms: Secondary | ICD-10-CM | POA: Diagnosis not present

## 2020-11-15 ENCOUNTER — Other Ambulatory Visit: Payer: Medicare Other

## 2020-11-15 ENCOUNTER — Ambulatory Visit: Payer: Medicare Other | Admitting: Gastroenterology

## 2020-11-15 DIAGNOSIS — H5789 Other specified disorders of eye and adnexa: Secondary | ICD-10-CM | POA: Diagnosis not present

## 2020-11-15 DIAGNOSIS — H35372 Puckering of macula, left eye: Secondary | ICD-10-CM | POA: Diagnosis not present

## 2020-11-15 DIAGNOSIS — E119 Type 2 diabetes mellitus without complications: Secondary | ICD-10-CM | POA: Diagnosis not present

## 2020-11-15 DIAGNOSIS — Z01818 Encounter for other preprocedural examination: Secondary | ICD-10-CM | POA: Diagnosis not present

## 2020-11-24 DIAGNOSIS — H35372 Puckering of macula, left eye: Secondary | ICD-10-CM | POA: Diagnosis not present

## 2020-12-06 ENCOUNTER — Other Ambulatory Visit: Payer: Medicare Other

## 2020-12-20 ENCOUNTER — Other Ambulatory Visit: Payer: Self-pay

## 2020-12-20 ENCOUNTER — Ambulatory Visit (INDEPENDENT_AMBULATORY_CARE_PROVIDER_SITE_OTHER): Payer: Medicare Other

## 2020-12-20 DIAGNOSIS — I350 Nonrheumatic aortic (valve) stenosis: Secondary | ICD-10-CM | POA: Diagnosis not present

## 2020-12-20 LAB — ECHOCARDIOGRAM COMPLETE
AR max vel: 0.94 cm2
AV Area VTI: 0.92 cm2
AV Area mean vel: 0.93 cm2
AV Mean grad: 23.3 mmHg
AV Peak grad: 39 mmHg
Ao pk vel: 3.12 m/s
Area-P 1/2: 4.06 cm2
MV VTI: 2.02 cm2
S' Lateral: 2.2 cm

## 2020-12-20 NOTE — Progress Notes (Signed)
Complete echocardiogram performed.  Jimmy Jaiya Mooradian RDCS, RVT  

## 2020-12-23 ENCOUNTER — Other Ambulatory Visit: Payer: Self-pay | Admitting: *Deleted

## 2020-12-23 DIAGNOSIS — I6523 Occlusion and stenosis of bilateral carotid arteries: Secondary | ICD-10-CM

## 2020-12-26 ENCOUNTER — Telehealth: Payer: Self-pay

## 2020-12-26 ENCOUNTER — Other Ambulatory Visit: Payer: Self-pay

## 2020-12-26 ENCOUNTER — Ambulatory Visit (INDEPENDENT_AMBULATORY_CARE_PROVIDER_SITE_OTHER): Payer: Medicare Other | Admitting: Gastroenterology

## 2020-12-26 ENCOUNTER — Encounter: Payer: Self-pay | Admitting: Gastroenterology

## 2020-12-26 VITALS — BP 138/52 | HR 90 | Ht 63.0 in | Wt 149.4 lb

## 2020-12-26 DIAGNOSIS — Z01818 Encounter for other preprocedural examination: Secondary | ICD-10-CM | POA: Diagnosis not present

## 2020-12-26 DIAGNOSIS — Z86010 Personal history of colonic polyps: Secondary | ICD-10-CM | POA: Diagnosis not present

## 2020-12-26 DIAGNOSIS — Z1211 Encounter for screening for malignant neoplasm of colon: Secondary | ICD-10-CM

## 2020-12-26 NOTE — Telephone Encounter (Signed)
[facility] Pre-operative Risk Assessment     Request for surgical clearance:     Endoscopy Procedure  What type of surgery is being performed?     Colonoscopy   When is this surgery scheduled?     01-11-2021  What type of clearance is required ?   Medical  Are there any medications that need to be held prior to surgery and how long? Patient needs medical clearance please   Practice name and name of physician performing surgery?      [facility] Dr Lyndel Safe  What is your office phone and fax number?      Phone- [phone]  [phone]  Anesthesia type (None, local, MAC, general) ?       MAC

## 2020-12-26 NOTE — Patient Instructions (Addendum)
If you are age 75 or older, your body mass index should be between 23-30. Your Body mass index is 26.46 kg/m. If this is out of the aforementioned range listed, please consider follow up with your Primary Care Provider.  If you are age 57 or younger, your body mass index should be between 19-25. Your Body mass index is 26.46 kg/m. If this is out of the aformentioned range listed, please consider follow up with your Primary Care Provider.   __________________________________________________________  The [place] [facility] providers would like to encourage you to use [facility] to communicate with providers for non-urgent requests or questions.  Due to long hold times on the telephone, sending your provider a message by [facility] may be a faster and more efficient way to get a response.  Please allow 48 business hours for a response.  Please remember that this is for non-urgent requests.   You have been scheduled for a colonoscopy. Please follow written instructions given to you at your visit today.  Please pick up your prep supplies at the pharmacy within the next 1-3 days. If you use inhalers (even only as needed), please bring them with you on the day of your procedure.  Your prep is miralax and you can use any other clear liquid to mix it in.  We will get clearence from your cardiologist. Please call us the week of the 20th if you haven't heard anything.   Thank you,  Dr. Jackquline Denmark

## 2020-12-26 NOTE — Progress Notes (Signed)
Chief Complaint: for colon  Referring Provider:  Nicoletta Dress, MD      ASSESSMENT AND PLAN;   #1. Colorectal cancer screening.  Likely FH of colon cancer.  #2. H/O polyps  Plan: - Proceed with colonoscopy with miralax after cardio clearence from Dr Bettina Gavia    Discussed risks & benefits. Risks including rare perforation req laparotomy, bleeding after bx/polypectomy req blood transfusion, rarely missing neoplasms, risks of anesthesia/sedation, rare risk of damage to internal organs. Benefits outweigh the risks. Patient agrees to proceed. All the questions were answered. Pt consents to proceed.    HPI:    Meredith Young is a 75 y.o. female  With moderate AS (Nl EF 60-65% on 2DE 12/2020), DM2, HLD, restless leg syndrome, history of acute diverticulitis 03/2011 treated with antibiotics, hypothyroidism, gout, CVA 2008, anxiety/depression  Here for colonoscopy.  No nausea, vomiting, heartburn, regurgitation, odynophagia or dysphagia.  No constipation.  No melena or hematochezia. No unintentional weight loss. No abdominal pain.  Has occ diarrhea x for over 20 years.  Negative random colon biopsies for microscopic colitis at previous colonoscopy 2012, intermittent.  Better with OTC Imodium.  Mom likely had unDx colon ca (too old for colon).  She had significant rectal bleeding before she died at age 63.  Previous GI work-up: Colonoscopy 05/2011 ([facility]): Colonic polyps s/p polypectomy, moderate predominantly sigmoid diverticulosis, internal hemorrhoids.  Random biopsies were negative for microscopic colitis.  Colonoscopy 2003: Tubular adenomas.  EGD 01/2008: Erosive gastritis.  Otherwise normal EGD.  Negative small bowel biopsies for celiac disease.  Negative gastric biopsies for HP.  SH-retired, used to be a Brewing technologist  Past Medical History:  Diagnosis Date   Ankle fracture, left, closed, initial encounter 02/02/2014   Anxiety 04/28/2014   Aortic stenosis  02/24/2015   Overview:  Very mild Nov 2015   Carotid artery occlusion    Carotid stenosis, bilateral 02/27/2016   Overview:  R ICA > 70%L ICA 50-69%   Cerebrovascular disease 04/28/2014   Chronic right shoulder pain 3/81/8299   Complication of anesthesia    Depression 04/28/2014   Diabetes mellitus type II, controlled ([place]) 04/28/2014   Diabetes mellitus without complication ([place])    Diverticulitis of colon    Essential hypertension 04/28/2014   GERD (gastroesophageal reflux disease)    Gout    Gout    Headache    History of colonic polyps 04/28/2014   History of hiatal hernia    Hx of colonic polyps    Hyperlipidemia    Hypertension    HEART BEATS FAST    Hypothyroidism    Idiopathic peripheral neuropathy    Ingrown nail of great toe of left foot 02/11/2017   Ingrown nail of great toe of right foot 01/04/2017   Low back pain 04/28/2014   OA (osteoarthritis) 04/28/2014   Occlusion and stenosis of carotid artery without mention of cerebral infarction 11/25/2012   Osteoarthritis    Palpitation 01/27/2016   Paronychia of great toe, left 02/11/2017   Paronychia of great toe, right 01/04/2017   Pernicious anemia 04/28/2014   PONV (postoperative nausea and vomiting)    RLS (restless legs syndrome) 04/28/2014   Rosacea 04/28/2014   S/P rotator cuff repair 09/17/2016   TIA (transient ischemic attack)    01/2016    Past Surgical History:  Procedure Laterality Date   ABDOMINAL HYSTERECTOMY     ANKLE SURGERY     LEFT   APPENDECTOMY     CHOLECYSTECTOMY  1991   COLONOSCOPY  06/04/2011   Colonic polyps status post polypectomy. MOderate redominantly sigmoid diverticuolosis. Small internal hemorrhoids.   ENDARTERECTOMY Right 02/27/2016   Procedure: RIGHT ENDARTERECTOMY CAROTID;  Surgeon: Waynetta Sandy, MD;  Location: [facility] OR;  Service: Vascular;  Laterality: Right;   ESOPHAGOGASTRODUODENOSCOPY  01/21/2008   Erosive gastritis. Otherwise normal EGD   NASAL SINUS SURGERY  1975    PATCH ANGIOPLASTY Right 02/27/2016   Procedure: PATCH ANGIOPLASTY USING Rue[employer] Bash BIOLOGIC PATCH;  Surgeon: Waynetta Sandy, MD;  Location: [facility] OR;  Service: Vascular;  Laterality: Right;   POLYPECTOMY  1985   TOOTH EXTRACTION      Family History  Problem Relation Age of Onset   Hypertension Mother    Heart disease Mother    COPD Mother    Stroke Father    Diabetes Father    Heart disease Father        before age 58   Hyperlipidemia Father    Hypertension Father    Other Father        amputation of great toe   Diabetes Sister    Colon cancer Neg Hx    Pancreatic cancer Neg Hx    Stomach cancer Neg Hx    Esophageal cancer Neg Hx    Liver disease Neg Hx     Social History   Tobacco Use   Smoking status: Former    Pack years: 0.00    Types: Cigarettes    Start date: 07/16/1988   Smokeless tobacco: Never  Vaping Use   Vaping Use: Never used  Substance Use Topics   Alcohol use: No   Drug use: No    Current Outpatient Medications  Medication Sig Dispense Refill   allopurinol (ZYLOPRIM) 300 MG tablet Take 300 mg by mouth daily.     amLODipine (NORVASC) 5 MG tablet Take 5 mg by mouth daily.  0   aspirin EC 81 MG tablet Take 81 mg by mouth daily.     Dulaglutide (TRULICITY) 1.5 ZO/1.0RU SOPN Inject into the skin once a week.     fexofenadine (ALLEGRA) 180 MG tablet Take 180 mg by mouth daily as needed for allergies or rhinitis.     folic acid (FOLVITE) 1 MG tablet Take 1 mg by mouth 2 (two) times daily.     glimepiride (AMARYL) 1 MG tablet Take 1 mg by mouth daily as needed.     levothyroxine (SYNTHROID) 88 MCG tablet Take 88 mcg by mouth daily before breakfast.     LORazepam (ATIVAN) 0.5 MG tablet Take 0.5 mg by mouth 2 (two) times daily as needed.     losartan (COZAAR) 100 MG tablet Take 100 mg by mouth daily.  3   metoprolol tartrate (LOPRESSOR) 25 MG tablet Take 12.5 mg by mouth 2 (two) times daily.     Multiple Vitamins-Minerals (MULTIVITAMIN PO) Take 1 tablet  by mouth daily.      omeprazole (PRILOSEC) 20 MG capsule Take 20 mg by mouth daily.     No current facility-administered medications for this visit.    Allergies  Allergen Reactions   Statins Other (See Comments)    Leg aches, Myalgias Leg aches, Myalgias    Codeine Nausea And Vomiting   Demerol [Meperidine] Nausea And Vomiting and Other (See Comments)    Violent behavior/reaction   Diltiazem Other (See Comments)    UNSPECIFIED , Unknown   Metformin Other (See Comments)    UNSPECIFIED   Metformin And Related Other (See Comments)  UNSPECIFIED     Review of Systems:  Constitutional: Denies fever, chills, diaphoresis, appetite change and has fatigue.  HEENT: Denies photophobia, eye pain, redness, hearing loss, ear pain, congestion, sore throat, rhinorrhea, sneezing, mouth sores, neck pain, neck stiffness and tinnitus.   Respiratory: Denies SOB, DOE, cough, chest tightness,  and wheezing.   Cardiovascular: Denies chest pain, palpitations and leg swelling.  Genitourinary: Denies dysuria, urgency, frequency, hematuria, flank pain and difficulty urinating.  Musculoskeletal: Denies myalgias, back pain, joint swelling, arthralgias and gait problem.  Skin: No rash.  Neurological: Denies dizziness, seizures, syncope, weakness, light-headedness, numbness and headaches.  Hematological: Denies adenopathy. Easy bruising, personal or family bleeding history  Psychiatric/Behavioral: has anxiety or depression     Physical Exam:    BP (!) 138/52 (BP Location: Left Arm, Cuff Size: Normal)   Pulse 90   Ht 5\' 3"  (1.6 m)   Wt 149 lb 6 oz (67.8 kg)   SpO2 97%   BMI 26.46 kg/m  Wt Readings from Last 3 Encounters:  12/26/20 149 lb 6 oz (67.8 kg)  07/26/20 152 lb (68.9 kg)  07/14/19 159 lb 3.2 oz (72.2 kg)   Constitutional:  Well-developed, in no acute distress. Psychiatric: Normal mood and affect. Behavior is normal. HEENT: Pupils normal.  Conjunctivae are normal. No scleral  icterus. Cardiovascular: Normal rate, regular rhythm. No edema.  Ejection systolic murmur at aortic area Pulmonary/chest: Effort normal and breath sounds normal. No wheezing, rales or rhonchi. Abdominal: Soft, nondistended. Nontender. Bowel sounds active throughout. There are no masses palpable. No hepatomegaly. Rectal: Deferred Neurological: Alert and oriented to person place and time. Skin: Skin is warm and dry. No rashes noted.      Carmell Austria, MD 12/26/2020, 2:46 PM  Cc: Nicoletta Dress, MD

## 2020-12-27 NOTE — Telephone Encounter (Signed)
I s/w the pt to let her know that we were trying to get her appt moved sooner for pre op clearance. Pt generally see's Dr. Bettina Gavia in the [place]. I advised the pt that I did not have anything sooner with Dr. Bettina Gavia or APP in the [facility] office before her procedure 01/11/21. Pt is currently scheduled to see Dr. Bettina Gavia 01/13/21. I did offer the pt an appt in the [place] office for tomorrow 12/28/20. Pt is willing to go to [place] office. Pt has been scheduled to see Dr. Berniece Salines, DO at 3:20 in the [place] office . Pt verbalized understanding to plan of care and location, date and time of appt for tomorrow. Will send clearance notes to DO for appt tomorrow. Will send FYI to requesting office pt has appt 12/28/20.

## 2020-12-27 NOTE — Telephone Encounter (Signed)
   Name: Meredith Young  DOB: 08/01/1945  MRN: [id number]  Primary Cardiologist: Dr. Bettina Gavia  Chart reviewed as part of pre-operative protocol coverage. Because of [name] current issues with chest pain, she will require a follow-up visit in order to better assess preoperative cardiovascular risk.  Pre-op covering staff: - Please schedule appointment and call patient to inform them. The patient already has an appointment scheduled for 01/13/21 however can we see if this can be moved up closer. If not, please add "pre-op clearance" to the appointment notes so provider is aware.   If applicable, this message will also be routed to pharmacy pool and/or primary cardiologist for input on holding anticoagulant/antiplatelet agent as requested below so that this information is available to the clearing provider at time of patient's appointment.   Kathyrn Drown, NP  12/27/2020, 9:42 AM

## 2020-12-28 ENCOUNTER — Ambulatory Visit (INDEPENDENT_AMBULATORY_CARE_PROVIDER_SITE_OTHER): Payer: Medicare Other | Admitting: Cardiology

## 2020-12-28 ENCOUNTER — Encounter: Payer: Self-pay | Admitting: Cardiology

## 2020-12-28 ENCOUNTER — Other Ambulatory Visit: Payer: Self-pay

## 2020-12-28 VITALS — BP 120/68 | HR 73 | Ht 63.0 in | Wt 150.0 lb

## 2020-12-28 DIAGNOSIS — E039 Hypothyroidism, unspecified: Secondary | ICD-10-CM | POA: Diagnosis not present

## 2020-12-28 DIAGNOSIS — R0789 Other chest pain: Secondary | ICD-10-CM | POA: Diagnosis not present

## 2020-12-28 DIAGNOSIS — I10 Essential (primary) hypertension: Secondary | ICD-10-CM | POA: Diagnosis not present

## 2020-12-28 DIAGNOSIS — F419 Anxiety disorder, unspecified: Secondary | ICD-10-CM | POA: Diagnosis not present

## 2020-12-28 DIAGNOSIS — E1142 Type 2 diabetes mellitus with diabetic polyneuropathy: Secondary | ICD-10-CM | POA: Diagnosis not present

## 2020-12-28 DIAGNOSIS — E785 Hyperlipidemia, unspecified: Secondary | ICD-10-CM

## 2020-12-28 DIAGNOSIS — E1100 Type 2 diabetes mellitus with hyperosmolarity without nonketotic hyperglycemic-hyperosmolar coma (NKHHC): Secondary | ICD-10-CM

## 2020-12-28 DIAGNOSIS — Z8673 Personal history of transient ischemic attack (TIA), and cerebral infarction without residual deficits: Secondary | ICD-10-CM | POA: Diagnosis not present

## 2020-12-28 DIAGNOSIS — R0609 Other forms of dyspnea: Secondary | ICD-10-CM | POA: Insufficient documentation

## 2020-12-28 DIAGNOSIS — M109 Gout, unspecified: Secondary | ICD-10-CM | POA: Diagnosis not present

## 2020-12-28 DIAGNOSIS — R5383 Other fatigue: Secondary | ICD-10-CM | POA: Diagnosis not present

## 2020-12-28 DIAGNOSIS — I350 Nonrheumatic aortic (valve) stenosis: Secondary | ICD-10-CM | POA: Diagnosis not present

## 2020-12-28 DIAGNOSIS — R0600 Dyspnea, unspecified: Secondary | ICD-10-CM | POA: Diagnosis not present

## 2020-12-28 DIAGNOSIS — Z6826 Body mass index (BMI) 26.0-26.9, adult: Secondary | ICD-10-CM | POA: Diagnosis not present

## 2020-12-28 DIAGNOSIS — Z789 Other specified health status: Secondary | ICD-10-CM | POA: Diagnosis not present

## 2020-12-28 DIAGNOSIS — R079 Chest pain, unspecified: Secondary | ICD-10-CM | POA: Insufficient documentation

## 2020-12-28 NOTE — H&P (View-Only) (Signed)
Cardiology Office Note:    Date:  12/28/2020   ID:  Meredith Young, DOB July 09, 1946, MRN [id number]  PCP:  Nicoletta Dress, MD  Cardiologist:  None  Electrophysiologist:  None   Referring MD: Nicoletta Dress, MD   No chief complaint on file. "I have been experiencing chest pain"  History of Present Illness:    Meredith Young is a 75 y.o. female with a hx of diabetes mellitus, hypertension, hyperlipidemia not on any lipid-lowering agents, moderate aortic stenosis on most recent echocardiogram in April 2022 (valve area by continuity equation 0.92 cm grade, mean gradient 23, peak velocity 3.12 cm/s) mild mitral regurgitation.  The patient usually follows with Dr. Bettina Gavia and was last seen by him in January 2022 at that time they discussed repeating her echocardiogram.  Her blood pressure was stable no medication changes were made.  The patient presents to be evaluated as she is planning a procedure which she tells me is a colonoscopy.  She notes that recently she has been experiencing intermittent chest discomfort.  She describes this as a burning sensation every time on his exertion.  She says is a discomfort which is dull sometimes aching is started her midsternal diffusely poor across her chest.  She has been concerned.  Is associated with significant dyspnea on exertion as well.  Sitting down makes this worse.  It last for about less than 30 minutes usually. No other concerns at this time.  Past Medical History:  Diagnosis Date   Ankle fracture, left, closed, initial encounter 02/02/2014   Anxiety 04/28/2014   Aortic stenosis 02/24/2015   Overview:  Very mild Nov 2015   Carotid artery occlusion    Carotid stenosis, bilateral 02/27/2016   Overview:  R ICA > 70%L ICA 50-69%   Cerebrovascular disease 04/28/2014   Chronic right shoulder pain 9/93/7169   Complication of anesthesia    Depression 04/28/2014   Diabetes mellitus type II, controlled ([place]) 04/28/2014   Diabetes  mellitus without complication ([place])    Diverticulitis of colon    Essential hypertension 04/28/2014   GERD (gastroesophageal reflux disease)    Gout    Gout    Headache    History of colonic polyps 04/28/2014   History of hiatal hernia    Hx of colonic polyps    Hyperlipidemia    Hypertension    HEART BEATS FAST    Hypothyroidism    Idiopathic peripheral neuropathy    Ingrown nail of great toe of left foot 02/11/2017   Ingrown nail of great toe of right foot 01/04/2017   Low back pain 04/28/2014   OA (osteoarthritis) 04/28/2014   Occlusion and stenosis of carotid artery without mention of cerebral infarction 11/25/2012   Osteoarthritis    Palpitation 01/27/2016   Paronychia of great toe, left 02/11/2017   Paronychia of great toe, right 01/04/2017   Pernicious anemia 04/28/2014   PONV (postoperative nausea and vomiting)    RLS (restless legs syndrome) 04/28/2014   Rosacea 04/28/2014   S/P rotator cuff repair 09/17/2016   TIA (transient ischemic attack)    01/2016    Past Surgical History:  Procedure Laterality Date   [place]   COLONOSCOPY  06/04/2011   Colonic polyps status post polypectomy. MOderate redominantly sigmoid diverticuolosis. Small internal hemorrhoids.   ENDARTERECTOMY Right 02/27/2016   Procedure: RIGHT ENDARTERECTOMY CAROTID;  Surgeon:  Waynetta Sandy, MD;  Location: [facility] OR;  Service: Vascular;  Laterality: Right;   ESOPHAGOGASTRODUODENOSCOPY  01/21/2008   Erosive gastritis. Otherwise normal EGD   NASAL SINUS SURGERY  1975   PATCH ANGIOPLASTY Right 02/27/2016   Procedure: PATCH ANGIOPLASTY USING Rue[employer] Bash BIOLOGIC PATCH;  Surgeon: Waynetta Sandy, MD;  Location: [facility];  Service: Vascular;  Laterality: Right;   POLYPECTOMY  1985   TOOTH EXTRACTION      Current Medications: Current Meds  Medication Sig   acetaminophen (TYLENOL) 500 MG tablet Take 500 mg  by mouth every 6 (six) hours as needed for mild pain or moderate pain.   allopurinol (ZYLOPRIM) 300 MG tablet Take 300 mg by mouth daily.   amLODipine (NORVASC) 5 MG tablet Take 5 mg by mouth daily.   aspirin EC 81 MG tablet Take 81 mg by mouth daily.   Dulaglutide (TRULICITY) 1.5 OH/6.0VP SOPN Inject into the skin once a week.   fexofenadine (ALLEGRA) 180 MG tablet Take 180 mg by mouth daily as needed for allergies or rhinitis.   folic acid (FOLVITE) 1 MG tablet Take 1 mg by mouth 2 (two) times daily.   glimepiride (AMARYL) 1 MG tablet Take 1 mg by mouth daily as needed.   levothyroxine (SYNTHROID) 88 MCG tablet Take 88 mcg by mouth daily before breakfast.   LORazepam (ATIVAN) 0.5 MG tablet Take 0.5 mg by mouth 2 (two) times daily as needed.   losartan (COZAAR) 100 MG tablet Take 100 mg by mouth daily.   metoprolol tartrate (LOPRESSOR) 25 MG tablet Take 12.5 mg by mouth 2 (two) times daily.   Multiple Vitamins-Minerals (MULTIVITAMIN PO) Take 1 tablet by mouth daily.    omeprazole (PRILOSEC) 20 MG capsule Take 20 mg by mouth daily.     Allergies:   Statins, Codeine, Demerol [meperidine], Diltiazem, Metformin, and Metformin and related   Social History   Socioeconomic History   Marital status: Widowed    Spouse name: Not on file   Number of children: 2   Years of education: Not on file   Highest education level: Not on file  Occupational History   Not on file  Tobacco Use   Smoking status: Former    Pack years: 0.00    Types: Cigarettes    Start date: 07/16/1988   Smokeless tobacco: Never  Vaping Use   Vaping Use: Never used  Substance and Sexual Activity   Alcohol use: No   Drug use: No   Sexual activity: Not on file  Other Topics Concern   Not on file  Social History Narrative   Not on file   Social Determinants of Health   Financial Resource Strain: Not on file  Food Insecurity: Not on file  Transportation Needs: Not on file  Physical Activity: Not on file  Stress:  Not on file  Social Connections: Not on file     Family History: The patient's family history includes COPD in her mother; Diabetes in her father and sister; Heart disease in her father and mother; Hyperlipidemia in her father; Hypertension in her father and mother; Other in her father; Stroke in her father. There is no history of Colon cancer, Pancreatic cancer, Stomach cancer, Esophageal cancer, or Liver disease.  ROS:   Review of Systems  Constitution: Negative for decreased appetite, fever and weight gain.  HENT: Negative for congestion, ear discharge, hoarse voice and sore throat.   Eyes: Negative for discharge, redness, vision loss in right eye and visual halos.  Cardiovascular: Negative for chest pain, dyspnea on exertion, leg swelling, orthopnea and palpitations.  Respiratory: Negative for cough, hemoptysis, shortness of breath and snoring.   Endocrine: Negative for heat intolerance and polyphagia.  Hematologic/Lymphatic: Negative for bleeding problem. Does not bruise/bleed easily.  Skin: Negative for flushing, nail changes, rash and suspicious lesions.  Musculoskeletal: Negative for arthritis, joint pain, muscle cramps, myalgias, neck pain and stiffness.  Gastrointestinal: Negative for abdominal pain, bowel incontinence, diarrhea and excessive appetite.  Genitourinary: Negative for decreased libido, genital sores and incomplete emptying.  Neurological: Negative for brief paralysis, focal weakness, headaches and loss of balance.  Psychiatric/Behavioral: Negative for altered mental status, depression and suicidal ideas.  Allergic/Immunologic: Negative for HIV exposure and persistent infections.    EKGs/Labs/Other Studies Reviewed:    The following studies were reviewed today:   EKG: None today  TTE December 20, 2020 IMPRESSIONS     1. Left ventricular ejection fraction, by estimation, is 60 to 65%. The  left ventricle has normal function. The left ventricle has no regional   wall motion abnormalities. There is moderate concentric left ventricular  hypertrophy. Left ventricular  diastolic parameters are consistent with Grade I diastolic dysfunction  (impaired relaxation). The average left ventricular global longitudinal  strain is -7.7 %. The global longitudinal strain is abnormal.   2. Right ventricular systolic function is mildly reduced. The right  ventricular size is normal. There is normal pulmonary artery systolic  pressure.   3. The mitral valve is normal in structure. Trivial mitral valve  regurgitation. Mild mitral stenosis. Moderate mitral annular  calcification.   4. The aortic valve is tricuspid. There is moderate calcification of the  aortic valve. Aortic valve regurgitation is not visualized. Moderate  aortic valve stenosis.   5. The inferior vena cava is normal in size with greater than 50%  respiratory variability, suggesting right atrial pressure of 3 mmHg.   FINDINGS   Left Ventricle: Left ventricular ejection fraction, by estimation, is 60  to 65%. The left ventricle has normal function. The left ventricle has no  regional wall motion abnormalities. The average left ventricular global  longitudinal strain is -7.7 %. The   global longitudinal strain is abnormal. The left ventricular internal  cavity size was normal in size. There is moderate concentric left  ventricular hypertrophy. Left ventricular diastolic parameters are  consistent with Grade I diastolic dysfunction  (impaired relaxation). Indeterminate filling pressures.   Right Ventricle: The right ventricular size is normal. No increase in  right ventricular wall thickness. Right ventricular systolic function is  mildly reduced. There is normal pulmonary artery systolic pressure. The  tricuspid regurgitant velocity is 2.52  m/s, and with an assumed right atrial pressure of 3 mmHg, the estimated  right ventricular systolic pressure is 82.5 mmHg.   Left Atrium: Left atrial size  was normal in size.   Right Atrium: Right atrial size was normal in size.   Pericardium: There is no evidence of pericardial effusion.   Mitral Valve: The mitral valve is normal in structure. There is mild  thickening of the mitral valve leaflet(s). Moderate mitral annular  calcification. Trivial mitral valve regurgitation. Mild mitral valve  stenosis. MV peak gradient, 9.9 mmHg. The mean  mitral valve gradient is 4.0 mmHg.   Tricuspid Valve: The tricuspid valve is normal in structure. Tricuspid  valve regurgitation is mild . No evidence of tricuspid stenosis.   Aortic Valve: The aortic valve is tricuspid. There is moderate  calcification of the aortic valve. Aortic  valve regurgitation is not  visualized. Moderate aortic stenosis is present. Aortic valve mean  gradient measures 23.3 mmHg. Aortic valve peak gradient   measures 39.0 mmHg. Aortic valve area, by VTI measures 0.92 cm.   Pulmonic Valve: The pulmonic valve was normal in structure. Pulmonic valve  regurgitation is not visualized. No evidence of pulmonic stenosis.   Aorta: The aortic root and ascending aorta are structurally normal, with  no evidence of dilitation and the aortic arch was not well visualized.   Venous: A normal flow pattern is recorded from the right upper pulmonary  vein. The inferior vena cava is normal in size with greater than 50%  respiratory variability, suggesting right atrial pressure of 3 mmHg.   IAS/Shunts: No atrial level shunt detected by color flow Doppler.       Recent Labs: No results found for requested labs within last 8760 hours.  Recent Lipid Panel No results found for: CHOL, TRIG, HDL, CHOLHDL, VLDL, LDLCALC, LDLDIRECT  Physical Exam:    VS:  BP 120/68   Pulse 73   Ht 5\' 3"  (1.6 m)   Wt 150 lb (68 kg)   SpO2 98%   BMI 26.57 kg/m     Wt Readings from Last 3 Encounters:  12/28/20 150 lb (68 kg)  12/26/20 149 lb 6 oz (67.8 kg)  07/26/20 152 lb (68.9 kg)     GEN: Well  nourished, well developed in no acute distress HEENT: Normal NECK: No JVD; No carotid bruits LYMPHATICS: No lymphadenopathy CARDIAC: S1S2 noted,RRR, no murmurs, rubs, gallops RESPIRATORY:  Clear to auscultation without rales, wheezing or rhonchi  ABDOMEN: Soft, non-tender, non-distended, +bowel sounds, no guarding. EXTREMITIES: No edema, No cyanosis, no clubbing MUSCULOSKELETAL:  No deformity  SKIN: Warm and dry NEUROLOGIC:  Alert and oriented x 3, non-focal PSYCHIATRIC:  Normal affect, good insight  ASSESSMENT:    1. Chest discomfort   2. Statin intolerance   3. Hyperlipidemia, unspecified hyperlipidemia type   4. DOE (dyspnea on exertion)   5. Fatigue, unspecified type   6. Moderate aortic stenosis   7. Type 2 diabetes mellitus with hyperosmolarity without coma, without long-term current use of insulin (HCC)    PLAN:    Her chest discomfort is concerning given her intermediate to high risk for coronary artery disease the most appropriate test in this patient will be a left heart catheterization.  I have discussed with the patient she is agreeable with this.  During her catheterization will also be able to go across the aortic valve to to assess the gradients.  The patient understands that risks include but are not limited to stroke (1 in 1000), death (1 in 21), kidney failure [usually temporary] (1 in 500), bleeding (1 in 200), allergic reaction [possibly serious] (1 in 200), and agrees to proceed.  Sublingual nitroglycerin prescription was sent, its protocol and 911 protocol explained and the patient vocalized understanding questions were answered to the patient's satisfaction.  In terms of her hyperlipidemia her most recent lipid profile which was done on September 27, 2020 showed HDL 48, LDL 168, total cholesterol 280, triglyceride 337.  I spoke to the patient in great length about the benefit of PCSK9 inhibitors.  We will get a LP(a) today and she is agreeable to be referred to our  advanced lipid clinic.  The patient understands the need to lose weight with diet and exercise. We have discussed specific strategies for this.   The patient is in agreement with the above plan. The  patient left the office in stable condition.  The patient will follow up in 2 weeks post LHC   Medication Adjustments/Labs and Tests Ordered: Current medicines are reviewed at length with the patient today.  Concerns regarding medicines are outlined above.  Orders Placed This Encounter  Procedures   Lipoprotein A (LPA)   Basic metabolic panel   Magnesium   CBC with Differential/Platelet   AMB Referral to Advanced Lipid Disorders Clinic   No orders of the defined types were placed in this encounter.   Patient Instructions  Medication Instructions:  Your physician recommends that you continue on your current medications as directed. Please refer to the Current Medication list given to you today.  *If you need a refill on your cardiac medications before your next appointment, please call your pharmacy*   Lab Work: Your physician recommends that you return for lab work in: TODAY: BMET, Mag, CBC, Lpa  If you have labs (blood work) drawn today and your tests are completely normal, you will receive your results only by: MyChart Message (if you have [place]) OR A paper copy in the mail If you have any lab test that is abnormal or we need to change your treatment, we will call you to review the results.   Testing/Procedures:    [facility] [place], [place] [address] Dept: [phone] Loc: [phone]  Meredith Young  12/28/2020  You are scheduled for a Cardiac Catheterization on Tuesday, June 21 with Dr. Glenetta Hew.  1. Please arrive at the [facility] (Main Entrance A) at [facility]: [address] [place], [place] [address] at 10:00 AM (This time is two hours  before your procedure to ensure your preparation). Free valet parking service is available.   Special note: Every effort is made to have your procedure done on time. Please understand that emergencies sometimes delay scheduled procedures.  2. Diet: Do not eat solid foods after midnight.  The patient may have clear liquids until 5am upon the day of the procedure.  3. Labs: You will need to have blood drawn on TODAY  4. Medication instructions in preparation for your procedure:   Contrast Allergy: No   On the morning of your procedure, take your Aspirin and any morning medicines NOT listed above.  You may use sips of water.  5. Plan for one night stay--bring personal belongings. 6. Bring a current list of your medications and current insurance cards. 7. You MUST have a responsible person to drive you home. 8. Someone MUST be with you the first 24 hours after you arrive home or your discharge will be delayed. 9. Please wear clothes that are easy to get on and off and wear slip-on shoes.  Thank you for allowing [place] to care for you!   -- [facility]    Follow-Up: At [facility], you and your health needs are our priority.  As part of our continuing mission to provide you with exceptional heart care, we have created designated Provider Care Teams.  These Care Teams include your primary Cardiologist (physician) and Advanced Practice Providers (APPs -  Physician Assistants and Nurse Practitioners) who all work together to provide you with the care you need, when you need it.  We recommend signing up for the patient portal called "MyChart".  Sign up information is provided on this After Visit Summary.  MyChart is used to connect with  patients for Virtual Visits (Telemedicine).  Patients are able to view lab/test results, encounter notes, upcoming appointments, etc.  Non-urgent messages can be sent to your provider as well.   To learn more about what you can do  with MyChart, go to [email or web address].    Your next appointment:   2 week(s)  The format for your next appointment:   In Person  Provider:   Shirlee More, MD or Berniece Salines, DO   Other Instructions    Adopting a Healthy Lifestyle.  Know what a healthy weight is for you (roughly BMI <25) and aim to maintain this   Aim for 7+ servings of fruits and vegetables daily   65-80+ fluid ounces of water or unsweet tea for healthy kidneys   Limit to max 1 drink of alcohol per day; avoid smoking/tobacco   Limit animal fats in diet for cholesterol and heart health - choose grass fed whenever available   Avoid highly processed foods, and foods high in saturated/trans fats   Aim for low stress - take time to unwind and care for your mental health   Aim for 150 min of moderate intensity exercise weekly for heart health, and weights twice weekly for bone health   Aim for 7-9 hours of sleep daily   When it comes to diets, agreement about the perfect plan isnt easy to find, even among the experts. Experts at the [employer] developed an idea known as the Healthy Eating Plate. Just imagine a plate divided into logical, healthy portions.   The emphasis is on diet quality:   Load up on vegetables and fruits - one-half of your plate: Aim for color and variety, and remember that potatoes dont count.   Go for whole grains - one-quarter of your plate: Whole wheat, barley, wheat berries, quinoa, oats, brown rice, and foods made with them. If you want pasta, go with whole wheat pasta.   Protein power - one-quarter of your plate: Fish, chicken, beans, and nuts are all healthy, versatile protein sources. Limit red meat.   The diet, however, does go beyond the plate, offering a few other suggestions.   Use healthy plant oils, such as olive, canola, soy, corn, sunflower and peanut. Check the labels, and avoid partially hydrogenated oil, which have unhealthy trans fats.    If youre thirsty, drink water. Coffee and tea are good in moderation, but skip sugary drinks and limit milk and dairy products to one or two daily servings.   The type of carbohydrate in the diet is more important than the amount. Some sources of carbohydrates, such as vegetables, fruits, whole grains, and beans-are healthier than others.   Finally, stay active  Signed, Berniece Salines, DO  12/28/2020 5:18 PM    [facility]

## 2020-12-28 NOTE — Progress Notes (Signed)
Cardiology Office Note:    Date:  12/28/2020   ID:  Meredith Young, DOB Mar 03, 1946, MRN [id number]  PCP:  Nicoletta Dress, MD  Cardiologist:  None  Electrophysiologist:  None   Referring MD: Nicoletta Dress, MD   No chief complaint on file. "I have been experiencing chest pain"  History of Present Illness:    Meredith Young is a 75 y.o. female with a hx of diabetes mellitus, hypertension, hyperlipidemia not on any lipid-lowering agents, moderate aortic stenosis on most recent echocardiogram in April 2022 (valve area by continuity equation 0.92 cm grade, mean gradient 23, peak velocity 3.12 cm/s) mild mitral regurgitation.  The patient usually follows with Dr. Bettina Gavia and was last seen by him in January 2022 at that time they discussed repeating her echocardiogram.  Her blood pressure was stable no medication changes were made.  The patient presents to be evaluated as she is planning a procedure which she tells me is a colonoscopy.  She notes that recently she has been experiencing intermittent chest discomfort.  She describes this as a burning sensation every time on his exertion.  She says is a discomfort which is dull sometimes aching is started her midsternal diffusely poor across her chest.  She has been concerned.  Is associated with significant dyspnea on exertion as well.  Sitting down makes this worse.  It last for about less than 30 minutes usually. No other concerns at this time.  Past Medical History:  Diagnosis Date   Ankle fracture, left, closed, initial encounter 02/02/2014   Anxiety 04/28/2014   Aortic stenosis 02/24/2015   Overview:  Very mild Nov 2015   Carotid artery occlusion    Carotid stenosis, bilateral 02/27/2016   Overview:  R ICA > 70%L ICA 50-69%   Cerebrovascular disease 04/28/2014   Chronic right shoulder pain 07/16/7508   Complication of anesthesia    Depression 04/28/2014   Diabetes mellitus type II, controlled ([place]) 04/28/2014   Diabetes  mellitus without complication ([place])    Diverticulitis of colon    Essential hypertension 04/28/2014   GERD (gastroesophageal reflux disease)    Gout    Gout    Headache    History of colonic polyps 04/28/2014   History of hiatal hernia    Hx of colonic polyps    Hyperlipidemia    Hypertension    HEART BEATS FAST    Hypothyroidism    Idiopathic peripheral neuropathy    Ingrown nail of great toe of left foot 02/11/2017   Ingrown nail of great toe of right foot 01/04/2017   Low back pain 04/28/2014   OA (osteoarthritis) 04/28/2014   Occlusion and stenosis of carotid artery without mention of cerebral infarction 11/25/2012   Osteoarthritis    Palpitation 01/27/2016   Paronychia of great toe, left 02/11/2017   Paronychia of great toe, right 01/04/2017   Pernicious anemia 04/28/2014   PONV (postoperative nausea and vomiting)    RLS (restless legs syndrome) 04/28/2014   Rosacea 04/28/2014   S/P rotator cuff repair 09/17/2016   TIA (transient ischemic attack)    01/2016    Past Surgical History:  Procedure Laterality Date   [place]   COLONOSCOPY  06/04/2011   Colonic polyps status post polypectomy. MOderate redominantly sigmoid diverticuolosis. Small internal hemorrhoids.   ENDARTERECTOMY Right 02/27/2016   Procedure: RIGHT ENDARTERECTOMY CAROTID;  Surgeon:  Waynetta Sandy, MD;  Location: [facility] OR;  Service: Vascular;  Laterality: Right;   ESOPHAGOGASTRODUODENOSCOPY  01/21/2008   Erosive gastritis. Otherwise normal EGD   NASAL SINUS SURGERY  1975   PATCH ANGIOPLASTY Right 02/27/2016   Procedure: PATCH ANGIOPLASTY USING Rueben Bash BIOLOGIC PATCH;  Surgeon: Waynetta Sandy, MD;  Location: [place];  Service: Vascular;  Laterality: Right;   POLYPECTOMY  1985   TOOTH EXTRACTION      Current Medications: Current Meds  Medication Sig   acetaminophen (TYLENOL) 500 MG tablet Take 500 mg  by mouth every 6 (six) hours as needed for mild pain or moderate pain.   allopurinol (ZYLOPRIM) 300 MG tablet Take 300 mg by mouth daily.   amLODipine (NORVASC) 5 MG tablet Take 5 mg by mouth daily.   aspirin EC 81 MG tablet Take 81 mg by mouth daily.   Dulaglutide (TRULICITY) 1.5 HF/0.2OV SOPN Inject into the skin once a week.   fexofenadine (ALLEGRA) 180 MG tablet Take 180 mg by mouth daily as needed for allergies or rhinitis.   folic acid (FOLVITE) 1 MG tablet Take 1 mg by mouth 2 (two) times daily.   glimepiride (AMARYL) 1 MG tablet Take 1 mg by mouth daily as needed.   levothyroxine (SYNTHROID) 88 MCG tablet Take 88 mcg by mouth daily before breakfast.   LORazepam (ATIVAN) 0.5 MG tablet Take 0.5 mg by mouth 2 (two) times daily as needed.   losartan (COZAAR) 100 MG tablet Take 100 mg by mouth daily.   metoprolol tartrate (LOPRESSOR) 25 MG tablet Take 12.5 mg by mouth 2 (two) times daily.   Multiple Vitamins-Minerals (MULTIVITAMIN PO) Take 1 tablet by mouth daily.    omeprazole (PRILOSEC) 20 MG capsule Take 20 mg by mouth daily.     Allergies:   Statins, Codeine, Demerol [meperidine], Diltiazem, Metformin, and Metformin and related   Social History   Socioeconomic History   Marital status: Widowed    Spouse name: Not on file   Number of children: 2   Years of education: Not on file   Highest education level: Not on file  Occupational History   Not on file  Tobacco Use   Smoking status: Former    Pack years: 0.00    Types: Cigarettes    Start date: 07/16/1988   Smokeless tobacco: Never  Vaping Use   Vaping Use: Never used  Substance and Sexual Activity   Alcohol use: No   Drug use: No   Sexual activity: Not on file  Other Topics Concern   Not on file  Social History Narrative   Not on file   Social Determinants of Health   Financial Resource Strain: Not on file  Food Insecurity: Not on file  Transportation Needs: Not on file  Physical Activity: Not on file  Stress:  Not on file  Social Connections: Not on file     Family History: The patient's family history includes COPD in her mother; Diabetes in her father and sister; Heart disease in her father and mother; Hyperlipidemia in her father; Hypertension in her father and mother; Other in her father; Stroke in her father. There is no history of Colon cancer, Pancreatic cancer, Stomach cancer, Esophageal cancer, or Liver disease.  ROS:   Review of Systems  Constitution: Negative for decreased appetite, fever and weight gain.  HENT: Negative for congestion, ear discharge, hoarse voice and sore throat.   Eyes: Negative for discharge, redness, vision loss in right eye and visual halos.  Cardiovascular: Negative for chest pain, dyspnea on exertion, leg swelling, orthopnea and palpitations.  Respiratory: Negative for cough, hemoptysis, shortness of breath and snoring.   Endocrine: Negative for heat intolerance and polyphagia.  Hematologic/Lymphatic: Negative for bleeding problem. Does not bruise/bleed easily.  Skin: Negative for flushing, nail changes, rash and suspicious lesions.  Musculoskeletal: Negative for arthritis, joint pain, muscle cramps, myalgias, neck pain and stiffness.  Gastrointestinal: Negative for abdominal pain, bowel incontinence, diarrhea and excessive appetite.  Genitourinary: Negative for decreased libido, genital sores and incomplete emptying.  Neurological: Negative for brief paralysis, focal weakness, headaches and loss of balance.  Psychiatric/Behavioral: Negative for altered mental status, depression and suicidal ideas.  Allergic/Immunologic: Negative for HIV exposure and persistent infections.    EKGs/Labs/Other Studies Reviewed:    The following studies were reviewed today:   EKG: None today  TTE December 20, 2020 IMPRESSIONS     1. Left ventricular ejection fraction, by estimation, is 60 to 65%. The  left ventricle has normal function. The left ventricle has no regional   wall motion abnormalities. There is moderate concentric left ventricular  hypertrophy. Left ventricular  diastolic parameters are consistent with Grade I diastolic dysfunction  (impaired relaxation). The average left ventricular global longitudinal  strain is -7.7 %. The global longitudinal strain is abnormal.   2. Right ventricular systolic function is mildly reduced. The right  ventricular size is normal. There is normal pulmonary artery systolic  pressure.   3. The mitral valve is normal in structure. Trivial mitral valve  regurgitation. Mild mitral stenosis. Moderate mitral annular  calcification.   4. The aortic valve is tricuspid. There is moderate calcification of the  aortic valve. Aortic valve regurgitation is not visualized. Moderate  aortic valve stenosis.   5. The inferior vena cava is normal in size with greater than 50%  respiratory variability, suggesting right atrial pressure of 3 mmHg.   FINDINGS   Left Ventricle: Left ventricular ejection fraction, by estimation, is 60  to 65%. The left ventricle has normal function. The left ventricle has no  regional wall motion abnormalities. The average left ventricular global  longitudinal strain is -7.7 %. The   global longitudinal strain is abnormal. The left ventricular internal  cavity size was normal in size. There is moderate concentric left  ventricular hypertrophy. Left ventricular diastolic parameters are  consistent with Grade I diastolic dysfunction  (impaired relaxation). Indeterminate filling pressures.   Right Ventricle: The right ventricular size is normal. No increase in  right ventricular wall thickness. Right ventricular systolic function is  mildly reduced. There is normal pulmonary artery systolic pressure. The  tricuspid regurgitant velocity is 2.52  m/s, and with an assumed right atrial pressure of 3 mmHg, the estimated  right ventricular systolic pressure is 19.6 mmHg.   Left Atrium: Left atrial size  was normal in size.   Right Atrium: Right atrial size was normal in size.   Pericardium: There is no evidence of pericardial effusion.   Mitral Valve: The mitral valve is normal in structure. There is mild  thickening of the mitral valve leaflet(s). Moderate mitral annular  calcification. Trivial mitral valve regurgitation. Mild mitral valve  stenosis. MV peak gradient, 9.9 mmHg. The mean  mitral valve gradient is 4.0 mmHg.   Tricuspid Valve: The tricuspid valve is normal in structure. Tricuspid  valve regurgitation is mild . No evidence of tricuspid stenosis.   Aortic Valve: The aortic valve is tricuspid. There is moderate  calcification of the aortic valve. Aortic  valve regurgitation is not  visualized. Moderate aortic stenosis is present. Aortic valve mean  gradient measures 23.3 mmHg. Aortic valve peak gradient   measures 39.0 mmHg. Aortic valve area, by VTI measures 0.92 cm.   Pulmonic Valve: The pulmonic valve was normal in structure. Pulmonic valve  regurgitation is not visualized. No evidence of pulmonic stenosis.   Aorta: The aortic root and ascending aorta are structurally normal, with  no evidence of dilitation and the aortic arch was not well visualized.   Venous: A normal flow pattern is recorded from the right upper pulmonary  vein. The inferior vena cava is normal in size with greater than 50%  respiratory variability, suggesting right atrial pressure of 3 mmHg.   IAS/Shunts: No atrial level shunt detected by color flow Doppler.       Recent Labs: No results found for requested labs within last 8760 hours.  Recent Lipid Panel No results found for: CHOL, TRIG, HDL, CHOLHDL, VLDL, LDLCALC, LDLDIRECT  Physical Exam:    VS:  BP 120/68   Pulse 73   Ht 5\' 3"  (1.6 m)   Wt 150 lb (68 kg)   SpO2 98%   BMI 26.57 kg/m     Wt Readings from Last 3 Encounters:  12/28/20 150 lb (68 kg)  12/26/20 149 lb 6 oz (67.8 kg)  07/26/20 152 lb (68.9 kg)     GEN: Well  nourished, well developed in no acute distress HEENT: Normal NECK: No JVD; No carotid bruits LYMPHATICS: No lymphadenopathy CARDIAC: S1S2 noted,RRR, no murmurs, rubs, gallops RESPIRATORY:  Clear to auscultation without rales, wheezing or rhonchi  ABDOMEN: Soft, non-tender, non-distended, +bowel sounds, no guarding. EXTREMITIES: No edema, No cyanosis, no clubbing MUSCULOSKELETAL:  No deformity  SKIN: Warm and dry NEUROLOGIC:  Alert and oriented x 3, non-focal PSYCHIATRIC:  Normal affect, good insight  ASSESSMENT:    1. Chest discomfort   2. Statin intolerance   3. Hyperlipidemia, unspecified hyperlipidemia type   4. DOE (dyspnea on exertion)   5. Fatigue, unspecified type   6. Moderate aortic stenosis   7. Type 2 diabetes mellitus with hyperosmolarity without coma, without long-term current use of insulin (HCC)    PLAN:    Her chest discomfort is concerning given her intermediate to high risk for coronary artery disease the most appropriate test in this patient will be a left heart catheterization.  I have discussed with the patient she is agreeable with this.  During her catheterization will also be able to go across the aortic valve to to assess the gradients.  The patient understands that risks include but are not limited to stroke (1 in 1000), death (1 in 2), kidney failure [usually temporary] (1 in 500), bleeding (1 in 200), allergic reaction [possibly serious] (1 in 200), and agrees to proceed.  Sublingual nitroglycerin prescription was sent, its protocol and 911 protocol explained and the patient vocalized understanding questions were answered to the patient's satisfaction.  In terms of her hyperlipidemia her most recent lipid profile which was done on September 27, 2020 showed HDL 48, LDL 168, total cholesterol 280, triglyceride 337.  I spoke to the patient in great length about the benefit of PCSK9 inhibitors.  We will get a LP(a) today and she is agreeable to be referred to our  advanced lipid clinic.  The patient understands the need to lose weight with diet and exercise. We have discussed specific strategies for this.   The patient is in agreement with the above plan. The  patient left the office in stable condition.  The patient will follow up in 2 weeks post LHC   Medication Adjustments/Labs and Tests Ordered: Current medicines are reviewed at length with the patient today.  Concerns regarding medicines are outlined above.  Orders Placed This Encounter  Procedures   Lipoprotein A (LPA)   Basic metabolic panel   Magnesium   CBC with Differential/Platelet   AMB Referral to Advanced Lipid Disorders Clinic   No orders of the defined types were placed in this encounter.   Patient Instructions  Medication Instructions:  Your physician recommends that you continue on your current medications as directed. Please refer to the Current Medication list given to you today.  *If you need a refill on your cardiac medications before your next appointment, please call your pharmacy*   Lab Work: Your physician recommends that you return for lab work in: TODAY: BMET, Mag, CBC, Lpa  If you have labs (blood work) drawn today and your tests are completely normal, you will receive your results only by: MyChart Message (if you have [place]) OR A paper copy in the mail If you have any lab test that is abnormal or we need to change your treatment, we will call you to review the results.   Testing/Procedures:    [facility] [address] [place] [place], [place] [address] Dept: [phone] Loc: [phone]  Meredith Young  12/28/2020  You are scheduled for a Cardiac Catheterization on Tuesday, June 21 with Dr. Glenetta Hew.  1. Please arrive at the [facility] (Main Entrance A) at [facility]: [address] [place], [place] [address] at 10:00 AM (This time is two hours  before your procedure to ensure your preparation). Free valet parking service is available.   Special note: Every effort is made to have your procedure done on time. Please understand that emergencies sometimes delay scheduled procedures.  2. Diet: Do not eat solid foods after midnight.  The patient may have clear liquids until 5am upon the day of the procedure.  3. Labs: You will need to have blood drawn on TODAY  4. Medication instructions in preparation for your procedure:   Contrast Allergy: No   On the morning of your procedure, take your Aspirin and any morning medicines NOT listed above.  You may use sips of water.  5. Plan for one night stay--bring personal belongings. 6. Bring a current list of your medications and current insurance cards. 7. You MUST have a responsible person to drive you home. 8. Someone MUST be with you the first 24 hours after you arrive home or your discharge will be delayed. 9. Please wear clothes that are easy to get on and off and wear slip-on shoes.  Thank you for allowing [place] to care for you!   -- [place] [facility] Cardi[facility] services    Follow-Up: At [facility], you and your health needs are our priority.  As part of our continuing mission to provide you with exceptional heart care, we have created designated Provider Care Teams.  These Care Teams include your primary Cardiologist (physician) and Advanced Practice Providers (APPs -  Physician Assistants and Nurse Practitioners) who all work together to provide you with the care you need, when you need it.  We recommend signing up for the patient portal called "MyChart".  Sign up information is provided on this After Visit Summary.  MyChart is used to connect with  patients for Virtual Visits (Telemedicine).  Patients are able to view lab/test results, encounter notes, upcoming appointments, etc.  Non-urgent messages can be sent to your provider as well.   To learn more about what you can do  with MyChart, go to [email or web address].    Your next appointment:   2 week(s)  The format for your next appointment:   In Person  Provider:   Shirlee More, MD or Berniece Salines, DO   Other Instructions    Adopting a Healthy Lifestyle.  Know what a healthy weight is for you (roughly BMI <25) and aim to maintain this   Aim for 7+ servings of fruits and vegetables daily   65-80+ fluid ounces of water or unsweet tea for healthy kidneys   Limit to max 1 drink of alcohol per day; avoid smoking/tobacco   Limit animal fats in diet for cholesterol and heart health - choose grass fed whenever available   Avoid highly processed foods, and foods high in saturated/trans fats   Aim for low stress - take time to unwind and care for your mental health   Aim for 150 min of moderate intensity exercise weekly for heart health, and weights twice weekly for bone health   Aim for 7-9 hours of sleep daily   When it comes to diets, agreement about the perfect plan isnt easy to find, even among the experts. Experts at the [employer] developed an idea known as the Healthy Eating Plate. Just imagine a plate divided into logical, healthy portions.   The emphasis is on diet quality:   Load up on vegetables and fruits - one-half of your plate: Aim for color and variety, and remember that potatoes dont count.   Go for whole grains - one-quarter of your plate: Whole wheat, barley, wheat berries, quinoa, oats, brown rice, and foods made with them. If you want pasta, go with whole wheat pasta.   Protein power - one-quarter of your plate: Fish, chicken, beans, and nuts are all healthy, versatile protein sources. Limit red meat.   The diet, however, does go beyond the plate, offering a few other suggestions.   Use healthy plant oils, such as olive, canola, soy, corn, sunflower and peanut. Check the labels, and avoid partially hydrogenated oil, which have unhealthy trans fats.    If youre thirsty, drink water. Coffee and tea are good in moderation, but skip sugary drinks and limit milk and dairy products to one or two daily servings.   The type of carbohydrate in the diet is more important than the amount. Some sources of carbohydrates, such as vegetables, fruits, whole grains, and beans-are healthier than others.   Finally, stay active  Signed, Berniece Salines, DO  12/28/2020 5:18 PM    [facility]

## 2020-12-28 NOTE — Addendum Note (Signed)
Addended by: Berniece Salines on: 12/28/2020 05:47 PM   Modules accepted: Orders, SmartSet

## 2020-12-28 NOTE — Patient Instructions (Signed)
Medication Instructions:  Your physician recommends that you continue on your current medications as directed. Please refer to the Current Medication list given to you today.  *If you need a refill on your cardiac medications before your next appointment, please call your pharmacy*   Lab Work: Your physician recommends that you return for lab work in: TODAY: BMET, Mag, CBC, Lpa  If you have labs (blood work) drawn today and your tests are completely normal, you will receive your results only by: MyChart Message (if you have [place]) OR A paper copy in the mail If you have any lab test that is abnormal or we need to change your treatment, we will call you to review the results.   Testing/Procedures:    [facility] [address], [place] [address] Dept: [phone] Loc: [phone]  Meredith Young  12/28/2020  You are scheduled for a Cardiac Catheterization on Tuesday, June 21 with Dr. Glenetta Hew.  1. Please arrive at the [facility] (Main Entrance A) at [facility]: [address] [place], [place] [address] at 10:00 AM (This time is two hours before your procedure to ensure your preparation). Free valet parking service is available.   Special note: Every effort is made to have your procedure done on time. Please understand that emergencies sometimes delay scheduled procedures.  2. Diet: Do not eat solid foods after midnight.  The patient may have clear liquids until 5am upon the day of the procedure.  3. Labs: You will need to have blood drawn on TODAY  4. Medication instructions in preparation for your procedure:   Contrast Allergy: No   On the morning of your procedure, take your Aspirin and any morning medicines NOT listed above.  You may use sips of water.  5. Plan for one night stay--bring personal belongings. 6. Bring a current list of your medications and  current insurance cards. 7. You MUST have a responsible person to drive you home. 8. Someone MUST be with you the first 24 hours after you arrive home or your discharge will be delayed. 9. Please wear clothes that are easy to get on and off and wear slip-on shoes.  Thank you for allowing [place] to care for you!   --  Invasive Cardiovascular services    Follow-Up: At [facility], you and your health needs are our priority.  As part of our continuing mission to provide you with exceptional heart care, we have created designated Provider Care Teams.  These Care Teams include your primary Cardiologist (physician) and Advanced Practice Providers (APPs -  Physician Assistants and Nurse Practitioners) who all work together to provide you with the care you need, when you need it.  We recommend signing up for the patient portal called "MyChart".  Sign up information is provided on this After Visit Summary.  MyChart is used to connect with patients for Virtual Visits (Telemedicine).  Patients are able to view lab/test results, encounter notes, upcoming appointments, etc.  Non-urgent messages can be sent to your provider as well.   To learn more about what you can do with MyChart, go to [email or web address].    Your next appointment:   2 week(s)  The format for your next appointment:   In Person  Provider:   Shirlee More, MD or Berniece Salines, DO   Other Instructions

## 2020-12-29 LAB — CBC WITH DIFFERENTIAL/PLATELET
Basophils Absolute: 0.1 x10E3/uL (ref 0.0–0.2)
Basos: 1 %
EOS (ABSOLUTE): 0.2 x10E3/uL (ref 0.0–0.4)
Eos: 3 %
Hematocrit: 38.7 % (ref 34.0–46.6)
Hemoglobin: 12.9 g/dL (ref 11.1–15.9)
Immature Grans (Abs): 0 x10E3/uL (ref 0.0–0.1)
Immature Granulocytes: 1 %
Lymphocytes Absolute: 1.7 x10E3/uL (ref 0.7–3.1)
Lymphs: 26 %
MCH: 30.5 pg (ref 26.6–33.0)
MCHC: 33.3 g/dL (ref 31.5–35.7)
MCV: 92 fL (ref 79–97)
Monocytes Absolute: 0.7 x10E3/uL (ref 0.1–0.9)
Monocytes: 10 %
Neutrophils Absolute: 3.9 x10E3/uL (ref 1.4–7.0)
Neutrophils: 59 %
Platelets: 257 x10E3/uL (ref 150–450)
RBC: 4.23 x10E6/uL (ref 3.77–5.28)
RDW: 13.2 % (ref 11.7–15.4)
WBC: 6.6 x10E3/uL (ref 3.4–10.8)

## 2020-12-29 LAB — BASIC METABOLIC PANEL
BUN/Creatinine Ratio: 24 (ref 12–28)
BUN: 36 mg/dL — ABNORMAL HIGH (ref 8–27)
CO2: 20 mmol/L (ref 20–29)
Calcium: 9.5 mg/dL (ref 8.7–10.3)
Chloride: 106 mmol/L (ref 96–106)
Creatinine, Ser: 1.47 mg/dL — ABNORMAL HIGH (ref 0.57–1.00)
Glucose: 74 mg/dL (ref 65–99)
Potassium: 5.3 mmol/L — ABNORMAL HIGH (ref 3.5–5.2)
Sodium: 140 mmol/L (ref 134–144)
eGFR: 37 mL/min/{1.73_m2} — ABNORMAL LOW (ref >59)

## 2020-12-29 LAB — MAGNESIUM: Magnesium: 2.2 mg/dL (ref 1.6–2.3)

## 2020-12-29 LAB — LIPOPROTEIN A (LPA): Lipoprotein (a): 23.5 nmol/L

## 2021-01-02 ENCOUNTER — Telehealth: Payer: Self-pay | Admitting: *Deleted

## 2021-01-02 NOTE — Telephone Encounter (Signed)
Pt contacted pre-catheterization scheduled at [facility] for: Tuesday January 03, 2021 12 Noon  Verified arrival time and place: [place] [facility]) at: 7 AM-pre-procedure hydration    No solid food after midnight prior to cath, clear liquids until 5 AM day of procedure.  Hold: Glimepiride -AM of procedure-pt reports she does not take regularly  Except hold medications AM meds can be  taken pre-cath with sips of water including: ASA 81 mg   Confirmed patient has responsible adult to drive home post procedure and be with patient first 24 hours after arriving home: yes  You are allowed ONE visitor in the waiting room during the time you are at the hospital for your procedure. Both you and your visitor must wear a mask once you enter the hospital.   Patient reports does not currently have any symptoms concerning for COVID-19 and no household members with COVID-19 like illness.      Reviewed procedure/mask/visitor instructions with patient.

## 2021-01-03 ENCOUNTER — Ambulatory Visit (HOSPITAL_COMMUNITY)
Admission: RE | Admit: 2021-01-03 | Discharge: 2021-01-03 | Disposition: A | Discharge status: Home / Self Care | Payer: Medicare Other | Attending: Cardiology | Admitting: Cardiology

## 2021-01-03 ENCOUNTER — Other Ambulatory Visit: Payer: Self-pay

## 2021-01-03 ENCOUNTER — Encounter (HOSPITAL_COMMUNITY)
Admission: RE | Disposition: A | Discharge status: Home / Self Care | Payer: Self-pay | Source: Home / Self Care | Attending: Cardiology

## 2021-01-03 DIAGNOSIS — E785 Hyperlipidemia, unspecified: Secondary | ICD-10-CM | POA: Insufficient documentation

## 2021-01-03 DIAGNOSIS — Z794 Long term (current) use of insulin: Secondary | ICD-10-CM | POA: Diagnosis not present

## 2021-01-03 DIAGNOSIS — Z7984 Long term (current) use of oral hypoglycemic drugs: Secondary | ICD-10-CM | POA: Diagnosis not present

## 2021-01-03 DIAGNOSIS — R079 Chest pain, unspecified: Secondary | ICD-10-CM | POA: Diagnosis present

## 2021-01-03 DIAGNOSIS — I350 Nonrheumatic aortic (valve) stenosis: Secondary | ICD-10-CM | POA: Diagnosis not present

## 2021-01-03 DIAGNOSIS — I2510 Atherosclerotic heart disease of native coronary artery without angina pectoris: Secondary | ICD-10-CM | POA: Diagnosis not present

## 2021-01-03 DIAGNOSIS — Z888 Allergy status to other drugs, medicaments and biological substances status: Secondary | ICD-10-CM | POA: Insufficient documentation

## 2021-01-03 DIAGNOSIS — Z79899 Other long term (current) drug therapy: Secondary | ICD-10-CM | POA: Insufficient documentation

## 2021-01-03 DIAGNOSIS — Z7982 Long term (current) use of aspirin: Secondary | ICD-10-CM | POA: Diagnosis not present

## 2021-01-03 DIAGNOSIS — R0789 Other chest pain: Secondary | ICD-10-CM | POA: Diagnosis not present

## 2021-01-03 DIAGNOSIS — R5383 Other fatigue: Secondary | ICD-10-CM | POA: Diagnosis not present

## 2021-01-03 DIAGNOSIS — E1100 Type 2 diabetes mellitus with hyperosmolarity without nonketotic hyperglycemic-hyperosmolar coma (NKHHC): Secondary | ICD-10-CM | POA: Diagnosis not present

## 2021-01-03 DIAGNOSIS — Z87891 Personal history of nicotine dependence: Secondary | ICD-10-CM | POA: Diagnosis not present

## 2021-01-03 DIAGNOSIS — Z885 Allergy status to narcotic agent status: Secondary | ICD-10-CM | POA: Insufficient documentation

## 2021-01-03 DIAGNOSIS — R0609 Other forms of dyspnea: Secondary | ICD-10-CM | POA: Insufficient documentation

## 2021-01-03 DIAGNOSIS — Z006 Encounter for examination for normal comparison and control in clinical research program: Secondary | ICD-10-CM

## 2021-01-03 DIAGNOSIS — Z79890 Hormone replacement therapy (postmenopausal): Secondary | ICD-10-CM | POA: Insufficient documentation

## 2021-01-03 DIAGNOSIS — R0600 Dyspnea, unspecified: Secondary | ICD-10-CM | POA: Diagnosis present

## 2021-01-03 HISTORY — PX: LEFT HEART CATH AND CORONARY ANGIOGRAPHY: CATH118249

## 2021-01-03 LAB — GLUCOSE, CAPILLARY
Glucose-Capillary: 82 mg/dL (ref 70–99)
Glucose-Capillary: 169 mg/dL — ABNORMAL HIGH (ref 70–99)

## 2021-01-03 SURGERY — LEFT HEART CATH AND CORONARY ANGIOGRAPHY
Anesthesia: LOCAL

## 2021-01-03 MED ORDER — SODIUM CHLORIDE 0.9% FLUSH: 3.0000 mL | Freq: Two times a day (BID) | INTRAVENOUS | Status: DC

## 2021-01-03 MED ORDER — IOHEXOL 350 MG/ML SOLN
INTRAVENOUS | Status: DC | PRN
  Administered 2021-01-03: 80 mL

## 2021-01-03 MED ORDER — LABETALOL HCL 5 MG/ML IV SOLN
INTRAVENOUS | Status: AC
  Filled 2021-01-03: qty 4

## 2021-01-03 MED ORDER — FENTANYL CITRATE (PF) 100 MCG/2ML IJ SOLN
INTRAMUSCULAR | Status: AC
  Filled 2021-01-03: qty 2

## 2021-01-03 MED ORDER — SODIUM CHLORIDE 0.9% FLUSH: 3.0000 mL | INTRAVENOUS | Status: DC | PRN

## 2021-01-03 MED ORDER — VERAPAMIL HCL 2.5 MG/ML IV SOLN
INTRAVENOUS | Status: AC
  Filled 2021-01-03: qty 2

## 2021-01-03 MED ORDER — LIDOCAINE HCL (PF) 1 % IJ SOLN
INTRAMUSCULAR | Status: AC
  Filled 2021-01-03: qty 30

## 2021-01-03 MED ORDER — HEPARIN (PORCINE) IN NACL 1000-0.9 UT/500ML-% IV SOLN
INTRAVENOUS | Status: AC
  Filled 2021-01-03: qty 1000

## 2021-01-03 MED ORDER — HEPARIN SODIUM (PORCINE) 1000 UNIT/ML IJ SOLN
INTRAMUSCULAR | Status: DC | PRN
  Administered 2021-01-03: 3500 [IU] via INTRAVENOUS

## 2021-01-03 MED ORDER — ASPIRIN 81 MG PO CHEW: 81.0000 mg | CHEWABLE_TABLET | ORAL | Status: DC

## 2021-01-03 MED ORDER — VERAPAMIL HCL 2.5 MG/ML IV SOLN
INTRAVENOUS | Status: DC | PRN
  Administered 2021-01-03: 10 mL via INTRA_ARTERIAL

## 2021-01-03 MED ORDER — SODIUM CHLORIDE 0.9 % IV SOLN: 250.0000 mL | INTRAVENOUS | Status: DC | PRN

## 2021-01-03 MED ORDER — SODIUM CHLORIDE 0.9 % WEIGHT BASED INFUSION: 1.0000 mL/kg/h | INTRAVENOUS | Status: DC

## 2021-01-03 MED ORDER — ACETAMINOPHEN 325 MG PO TABS: 650.0000 mg | ORAL_TABLET | ORAL | Status: DC | PRN

## 2021-01-03 MED ORDER — MIDAZOLAM HCL 2 MG/2ML IJ SOLN
INTRAMUSCULAR | Status: AC
  Filled 2021-01-03: qty 2

## 2021-01-03 MED ORDER — HEPARIN (PORCINE) IN NACL 1000-0.9 UT/500ML-% IV SOLN
INTRAVENOUS | Status: DC | PRN
  Administered 2021-01-03 (×2): 500 mL

## 2021-01-03 MED ORDER — HEPARIN SODIUM (PORCINE) 1000 UNIT/ML IJ SOLN
INTRAMUSCULAR | Status: AC
  Filled 2021-01-03: qty 1

## 2021-01-03 MED ORDER — ONDANSETRON HCL 4 MG/2ML IJ SOLN: 4.0000 mg | Freq: Four times a day (QID) | INTRAMUSCULAR | Status: DC | PRN

## 2021-01-03 MED ORDER — LIDOCAINE HCL (PF) 1 % IJ SOLN
INTRAMUSCULAR | Status: DC | PRN
  Administered 2021-01-03: 2 mL

## 2021-01-03 MED ORDER — SODIUM CHLORIDE 0.9 % IV SOLN: INTRAVENOUS | Status: DC

## 2021-01-03 MED ORDER — HYDRALAZINE HCL 20 MG/ML IJ SOLN: 10.0000 mg | INTRAMUSCULAR | Status: DC | PRN

## 2021-01-03 MED ORDER — LABETALOL HCL 5 MG/ML IV SOLN
INTRAVENOUS | Status: DC | PRN
  Administered 2021-01-03: 10 mg via INTRAVENOUS

## 2021-01-03 MED ORDER — SODIUM CHLORIDE 0.9 % WEIGHT BASED INFUSION
3.0000 mL/kg/h | INTRAVENOUS | Status: AC
Start: 2021-01-03 — End: 2021-01-03
  Administered 2021-01-03: 3 mL/kg/h via INTRAVENOUS

## 2021-01-03 MED ORDER — FENTANYL CITRATE (PF) 100 MCG/2ML IJ SOLN
INTRAMUSCULAR | Status: DC | PRN
  Administered 2021-01-03: 25 ug via INTRAVENOUS

## 2021-01-03 MED ORDER — MIDAZOLAM HCL 2 MG/2ML IJ SOLN
INTRAMUSCULAR | Status: DC | PRN
  Administered 2021-01-03: 1 mg via INTRAVENOUS

## 2021-01-03 MED ORDER — LABETALOL HCL 5 MG/ML IV SOLN: 10.0000 mg | INTRAVENOUS | Status: DC | PRN

## 2021-01-03 SURGICAL SUPPLY — 10 items
CATH OPTITORQUE TIG 4.0 5F (CATHETERS) ×2 IMPLANT
DEVICE RAD TR BAND REGULAR (VASCULAR PRODUCTS) ×2 IMPLANT
GLIDESHEATH SLEND SS 6F .021 (SHEATH) ×2 IMPLANT
GUIDEWIRE INQWIRE 1.5J.035X260 (WIRE) ×1 IMPLANT
INQWIRE 1.5J .035X260CM (WIRE) ×2
KIT HEART LEFT (KITS) ×2 IMPLANT
PACK CARDIAC CATHETERIZATION (CUSTOM PROCEDURE TRAY) ×2 IMPLANT
SHEATH PROBE COVER 6X72 (BAG) ×2 IMPLANT
TRANSDUCER W/STOPCOCK (MISCELLANEOUS) ×2 IMPLANT
TUBING CIL FLEX 10 FLL-RA (TUBING) ×2 IMPLANT

## 2021-01-03 NOTE — Discharge Instructions (Signed)
Radial Site Care  This sheet gives you information about how to care for yourself after your procedure. Your health care provider may also give you more specific instructions. If you have problems or questions, contact your health care provider. What can I expect after the procedure? After the procedure, it is common to have: Bruising and tenderness at the catheter insertion area. Follow these instructions at home: Medicines Take over-the-counter and prescription medicines only as told by your health care provider. Insertion site care Follow instructions from your health care provider about how to take care of your insertion site. Make sure you: Wash your hands with soap and water before you remove your bandage (dressing). If soap and water are not available, use hand sanitizer. May remove dressing in 24 hours. Check your insertion site every day for signs of infection. Check for: Redness, swelling, or pain. Fluid or blood. Pus or a bad smell. Warmth. Do no take baths, swim, or use a hot tub for 5 days. You may shower 24-48 hours after the procedure. Remove the dressing and gently wash the site with plain soap and water. Pat the area dry with a clean towel. Do not rub the site. That could cause bleeding. Do not apply powder or lotion to the site. Activity  For 24 hours after the procedure, or as directed by your health care provider: Do not flex or bend the affected arm. Do not push or pull heavy objects with the affected arm. Do not drive yourself home from the hospital or clinic. You may drive 24 hours after the procedure. Do not operate machinery or power tools. KEEP ARM ELEVATED THE REMAINDER OF THE DAY. Do not push, pull or lift anything that is heavier than 10 lb for 5 days. Ask your health care provider when it is okay to: Return to work or school. Resume usual physical activities or sports. Resume sexual activity. General instructions If the catheter site starts to  bleed, raise your arm and put firm pressure on the site. If the bleeding does not stop, get help right away. This is a medical emergency. DRINK PLENTY OF FLUIDS FOR THE NEXT 2-3 DAYS. No alcohol consumption for 24 hours after receiving sedation. If you went home on the same day as your procedure, a responsible adult should be with you for the first 24 hours after you arrive home. Keep all follow-up visits as told by your health care provider. This is important. Contact a health care provider if: You have a fever. You have redness, swelling, or yellow drainage around your insertion site. Get help right away if: You have unusual pain at the radial site. The catheter insertion area swells very fast. The insertion area is bleeding, and the bleeding does not stop when you hold steady pressure on the area. Your arm or hand becomes pale, cool, tingly, or numb. These symptoms may represent a serious problem that is an emergency. Do not wait to see if the symptoms will go away. Get medical help right away. Call your local emergency services (911 in the [place] Do not drive yourself to the hospital. Summary After the procedure, it is common to have bruising and tenderness at the site. Follow instructions from your health care provider about how to take care of your radial site wound. Check the wound every day for signs of infection.  This information is not intended to replace advice given to you by your health care provider. Make sure you discuss any questions you have with   your health care provider. Document Revised: 08/07/2017 Document Reviewed: 08/07/2017 Elsevier Patient Education  2020 Else[employer] Inc.  

## 2021-01-03 NOTE — Research (Signed)
IDENTIFY Informed Consent                  Subject Name: Meredith Young   Subject met inclusion and exclusion criteria.  The informed consent form, study requirements and expectations were reviewed with the subject and questions and concerns were addressed prior to the signing of the consent form.  The subject verbalized understanding of the trial requirements.  The subject agreed to participate in the IDENTIFY trial and signed the informed consent at 08:41AM on 01/03/21.  The informed consent was obtained prior to performance of any protocol-specific procedures for the subject.  A copy of the signed informed consent was given to the subject and a copy was placed in the subject's medical record.   Sallye Ober , Research Assistant

## 2021-01-03 NOTE — Progress Notes (Signed)
Pt ambulated without difficulty or bleeding.   Discharged home with her son who will drive and stay with pt x 24 hrs.

## 2021-01-03 NOTE — Interval H&P Note (Signed)
History and Physical Interval Note:  01/03/2021 1:24 PM  Meredith Young  has presented today for surgery, with the diagnosis of chest pain.  The various methods of treatment have been discussed with the patient and family. After consideration of risks, benefits and other options for treatment, the patient has consented to  Procedure(s): LEFT HEART CATH AND CORONARY ANGIOGRAPHY (N/A) PERCUTANEOUS CORONARY INTERVENTION   as a surgical intervention.  The patient's history has been reviewed, patient examined, no change in status, stable for surgery.  I have reviewed the patient's chart and labs.  Questions were answered to the patient's satisfaction.    Cath Lab Visit (complete for each Cath Lab visit)  Clinical Evaluation Leading to the Procedure:   ACS: No.  Non-ACS:    Anginal Classification: CCS III  Anti-ischemic medical therapy: Maximal Therapy (2 or more classes of medications)  Non-Invasive Test Results: No non-invasive testing performed  Prior CABG: No previous CABG   Glenetta Hew

## 2021-01-04 ENCOUNTER — Encounter (HOSPITAL_COMMUNITY): Payer: Self-pay | Admitting: Cardiology

## 2021-01-05 DIAGNOSIS — H35372 Puckering of macula, left eye: Secondary | ICD-10-CM | POA: Diagnosis not present

## 2021-01-05 NOTE — Telephone Encounter (Signed)
Just trying to follow up on this patient to see if she cleared to have her colonoscopy?

## 2021-01-05 NOTE — Telephone Encounter (Signed)
   Name: GENETTA FIERO  DOB: 11/06/1945  MRN: [id number]   Primary Cardiologist: Shirlee More, MD  Chart reviewed as part of pre-operative protocol coverage. Patient was contacted 01/05/2021 in reference to pre-operative risk assessment for pending surgery as outlined below.  SHARNEE DOUGLASS was last seen on 12/28/20 by Dr. Harriet Masson. At that visit, she reported symptoms concerning for angina. As such, she underwent definitive angiography on 01/03/21 that showed only nonobstructive disease, no intervention was performed. Echo showed a progression in aortic stenosis, but not yet severe.   Therefore, based on ACC/AHA guidelines, the patient would be at acceptable risk for the planned procedure without further cardiovascular testing.   I will route this recommendation to the requesting party via Epic fax function and remove from pre-op pool. Please call with questions.  Tami Lin Jeffie Widdowson, PA 01/05/2021, 2:57 PM

## 2021-01-09 ENCOUNTER — Telehealth: Payer: Self-pay | Admitting: Gastroenterology

## 2021-01-09 NOTE — Telephone Encounter (Signed)
Thanks for letting me know RG 

## 2021-01-09 NOTE — Progress Notes (Signed)
Cardiology Office Note:    Date:  01/10/2021   ID:  Meredith Young, DOB [birth year], MRN [id number]  PCP:  Nicoletta Dress, MD  Cardiologist:  Shirlee More, MD    Referring MD: Nicoletta Dress, MD    ASSESSMENT:    1. Moderate aortic stenosis   2. Mild CAD   3. Pure hypercholesterolemia   4. Essential hypertension   5. Chronic kidney disease, unspecified CKD stage    PLAN:    In order of problems listed above:  Aortic stenosis has not progressed on related to her symptoms and in retrospect were atypical and related to stress of her mother's death and hospice at this time I will plan to see back in the office in 1 year with a repeat echocardiogram. Mild CAD she can take if she has chest discomfort I gave her prescription for nitroglycerin she can use as needed and infrequent placed on oral nitrates.  Continue aspirin. BP at target continue current treatment Currently not on lipid-lowering therapy statin intolerant Referral to nephrology her request  Orders Placed This Encounter  Procedures   Ambulatory referral to Nephrology    Meds ordered this encounter  Medications   nitroGLYCERIN (NITROSTAT) 0.4 MG SL tablet    Sig: Place 1 tablet (0.4 mg total) under the tongue every 5 (five) minutes as needed.    Dispense:  30 tablet    Refill:  3     No chief complaint on file.   History of Present Illness:    Meredith Young is a 75 y.o. female with a hx of diabetes mellitus hypertension hyperlipidemia moderate aortic stenosis mean gradient 23 mmHg and mild mitral regurgitation last seen 12/28/2020 with complaints of chest pain and shortness of breath.  Compliance with diet, lifestyle and medications: Yes  I reviewed the results of her coronary angiogram and echocardiogram with the patient. She thinks much of her symptoms were not cardiac and related to stress and anxiety with the death of her mother in hospice. Since the procedure she has had no further chest or  abdominal discomfort. I did give her a prescription for nitroglycerin to use as needed and told her if she was having frequent episodes we could place her on oral nitrates. She is reassured that her aortic stenosis has not progressed. No edema shortness of breath palpitation or syncope.  Recent left heart catheterization showed minimal CAD and stable aortic stenosis with a peak to peak gradient aortic valve of 20 to 22 mmHg moderate aortic stenosis.  Recent labs 01/03/2021 from her PCP shows a severely elevated cholesterol 293 LDL of 204 triglycerides 74 HDL 78.  CMP was normal creatinine 0.89 potassium 4.5 iron studies are normal hemoglobin 14.8 hemoglobin A1c 6.4%  Procedures  LEFT HEART CATH AND CORONARY ANGIOGRAPHY   Coronary Diagrams  Diagnostic Dominance: Right   RPAV lesion is 40% stenosed. Prox LAD to Mid LAD lesion is 30% stenosed with 50% stenosed side branch in 1st Diag. LV end diastolic pressure is normal. There is moderate aortic valve stenosis.   SUMMARY Angiographically minimal CAD with extremely dominant RCA that basically perfuses the circumflex distribution.   There is no anatomic Circumflex Coronary Artery.   The LAD is in fact also relatively small in caliber and diminutive with multiple diagonal branches Normal  LVEDP with peak to peak gradient in the aortic valve of roughly 20 to 22 mmHg suggestive of moderate aortic stenosis. Significant mitral annular calcification noted.     RECOMMENDATION  Consider nonanginal etiology for chest pain versus microvascular disease ([facility]OCA) Aggressive blood pressure management.   Echo 12/20/2020 1. Left ventricular ejection fraction, by estimation, is 60 to 65%. The  left ventricle has normal function. The left ventricle has no regional  wall motion abnormalities. There is moderate concentric left ventricular  hypertrophy. Left ventricular  diastolic parameters are consistent with Grade I diastolic dysfunction  (impaired  relaxation). The average left ventricular global longitudinal  strain is -7.7 %. The global longitudinal strain is abnormal.   2. Right ventricular systolic function is mildly reduced. The right  ventricular size is normal. There is normal pulmonary artery systolic  pressure.   3. The mitral valve is normal in structure. Trivial mitral valve  regurgitation. Mild mitral stenosis. Moderate mitral annular  calcification.   4. The aortic valve is tricuspid. There is moderate calcification of the  aortic valve. Aortic valve regurgitation is not visualized. Moderate  aortic valve stenosis. Aortic valve mean  gradient measures 23.3 mmHg. Aortic valve peak gradient   measures 39.0 mmHg. Aortic valve area, by VTI measures 0.92 cm  5. The inferior vena cava is normal in size with greater than 50%  respiratory variability, suggesting right atrial pressure of 3 mmHg.  Past Medical History:  Diagnosis Date   Ankle fracture, left, closed, initial encounter 02/02/2014   Anxiety 04/28/2014   Aortic stenosis 02/24/2015   Overview:  Very mild Nov 2015   Carotid artery occlusion    Carotid stenosis, bilateral 02/27/2016   Overview:  R ICA > 70%L ICA 50-69%   Cerebrovascular disease 04/28/2014   Chronic right shoulder pain 01/10/3150   Complication of anesthesia    Depression 04/28/2014   Diabetes mellitus type II, controlled ([place]) 04/28/2014   Diabetes mellitus without complication ([place])    Diverticulitis of colon    Essential hypertension 04/28/2014   GERD (gastroesophageal reflux disease)    Gout    Gout    Headache    History of colonic polyps 04/28/2014   History of hiatal hernia    Hx of colonic polyps    Hyperlipidemia    Hypertension    HEART BEATS FAST    Hypothyroidism    Idiopathic peripheral neuropathy    Ingrown nail of great toe of left foot 02/11/2017   Ingrown nail of great toe of right foot 01/04/2017   Low back pain 04/28/2014   OA (osteoarthritis) 04/28/2014   Occlusion and  stenosis of carotid artery without mention of cerebral infarction 11/25/2012   Osteoarthritis    Palpitation 01/27/2016   Paronychia of great toe, left 02/11/2017   Paronychia of great toe, right 01/04/2017   Pernicious anemia 04/28/2014   PONV (postoperative nausea and vomiting)    RLS (restless legs syndrome) 04/28/2014   Rosacea 04/28/2014   S/P rotator cuff repair 09/17/2016   TIA (transient ischemic attack)    01/2016    Past Surgical History:  Procedure Laterality Date   [place]   COLONOSCOPY  06/04/2011   Colonic polyps status post polypectomy. MOderate redominantly sigmoid diverticuolosis. Small internal hemorrhoids.   ENDARTERECTOMY Right 02/27/2016   Procedure: RIGHT ENDARTERECTOMY CAROTID;  Surgeon: Waynetta Sandy, MD;  Location: [facility] OR;  Service: Vascular;  Laterality: Right;   ESOPHAGOGASTRODUODENOSCOPY  01/21/2008   Erosive gastritis. Otherwise normal EGD   LEFT HEART CATH AND CORONARY ANGIOGRAPHY N/A 01/03/2021   Procedure:  LEFT HEART CATH AND CORONARY ANGIOGRAPHY;  Surgeon: Leonie Man, MD;  Location: [place] [facility];  Service: Cardiovascular;  Laterality: N/A;   NASAL SINUS SURGERY  1975   PATCH ANGIOPLASTY Right 02/27/2016   Procedure: PATCH ANGIOPLASTY USING Rueben Bash BIOLOGIC PATCH;  Surgeon: Waynetta Sandy, MD;  Location: [place];  Service: Vascular;  Laterality: Right;   POLYPECTOMY  1985   TOOTH EXTRACTION      Current Medications: Current Meds  Medication Sig   acetaminophen (TYLENOL) 500 MG tablet Take 500 mg by mouth every 6 (six) hours as needed for mild pain or moderate pain.   allopurinol (ZYLOPRIM) 300 MG tablet Take 300 mg by mouth daily in the afternoon. midday   amLODipine (NORVASC) 5 MG tablet Take 5 mg by mouth daily in the afternoon. midday   aspirin EC 81 MG tablet Take 81 mg by mouth in the morning and at bedtime. (Midday & bedtime)    Dulaglutide (TRULICITY) 1.5 VQ/2.5ZD SOPN Inject 1.5 mg into the skin every Thursday.   fexofenadine (ALLEGRA) 180 MG tablet Take 180 mg by mouth daily as needed for allergies or rhinitis.   folic acid (FOLVITE) 1 MG tablet Take 1 mg by mouth 2 (two) times a week.   glimepiride (AMARYL) 1 MG tablet Take 1 mg by mouth daily as needed (blood sugar greater 150.).   levothyroxine (SYNTHROID) 88 MCG tablet Take 88 mcg by mouth daily before breakfast.   LORazepam (ATIVAN) 0.5 MG tablet Take 0.5 mg by mouth 2 (two) times daily as needed (panic attacks).   losartan (COZAAR) 100 MG tablet Take 100 mg by mouth daily at 10 pm. (2200)   metoprolol tartrate (LOPRESSOR) 25 MG tablet Take 12.5 mg by mouth 2 (two) times daily.   Multiple Vitamins-Minerals (MULTIVITAMIN PO) Take 1 tablet by mouth daily.    nitroGLYCERIN (NITROSTAT) 0.4 MG SL tablet Place 1 tablet (0.4 mg total) under the tongue every 5 (five) minutes as needed.   omeprazole (PRILOSEC) 20 MG capsule Take 20 mg by mouth daily in the afternoon. midday     Allergies:   Statins, Codeine, Demerol [meperidine], Diltiazem, Metformin, and Metformin and related   Social History   Socioeconomic History   Marital status: Widowed    Spouse name: Not on file   Number of children: 2   Years of education: Not on file   Highest education level: Not on file  Occupational History   Not on file  Tobacco Use   Smoking status: Former    Pack years: 0.00    Types: Cigarettes    Start date: 07/16/1988   Smokeless tobacco: Never  Vaping Use   Vaping Use: Never used  Substance and Sexual Activity   Alcohol use: No   Drug use: No   Sexual activity: Not on file  Other Topics Concern   Not on file  Social History Narrative   Not on file   Social Determinants of Health   Financial Resource Strain: Not on file  Food Insecurity: Not on file  Transportation Needs: Not on file  Physical Activity: Not on file  Stress: Not on file  Social Connections:  Not on file     Family History: The patient's family history includes COPD in her mother; Diabetes in her father and sister; Heart disease in her father and mother; Hyperlipidemia in her father; Hypertension in her father and mother; Other in her father; Stroke in her father. There is no history of Colon cancer,  Pancreatic cancer, Stomach cancer, Esophageal cancer, or Liver disease. ROS:   Please see the history of present illness.    All other systems reviewed and are negative.  EKGs/Labs/Other Studies Reviewed:    The following studies were reviewed today:   Recent Labs: 12/28/2020: BUN 36; Creatinine, Ser 1.47; Hemoglobin 12.9; Magnesium 2.2; Platelets 257; Potassium 5.3; Sodium 140  Recent Lipid Panel No results found for: CHOL, TRIG, HDL, CHOLHDL, VLDL, LDLCALC, LDLDIRECT  Physical Exam:    VS:  BP (!) 106/52 (BP Location: Left Arm, Patient Position: Sitting, Cuff Size: Normal)   Pulse 100   Ht 5\' 3"  (1.6 m)   Wt 148 lb 3.2 oz (67.2 kg)   SpO2 99%   BMI 26.25 kg/m     Wt Readings from Last 3 Encounters:  01/10/21 148 lb 3.2 oz (67.2 kg)  01/03/21 146 lb (66.2 kg)  12/28/20 150 lb (68 kg)     GEN:  Well nourished, well developed in no acute distress HEENT: Normal NECK: No JVD; No carotid bruits LYMPHATICS: No lymphadenopathy CARDIAC: 2/6 midsystolic ejection murmur does not encompass S2 and no aortic regurgitation RRR, no murmurs, rubs, gallops RESPIRATORY:  Clear to auscultation without rales, wheezing or rhonchi  ABDOMEN: Soft, non-tender, non-distended MUSCULOSKELETAL:  No edema; No deformity  SKIN: Warm and dry NEUROLOGIC:  Alert and oriented x 3 PSYCHIATRIC:  Normal affect    Signed, Shirlee More, MD  01/10/2021 11:50 AM    [place]

## 2021-01-09 NOTE — Telephone Encounter (Signed)
Good morning Dr. Lyndel Safe,   Patient called into cancel procedure for 6/29. States she have too much to handle following the death of her mother in 09-Dec-2022. States she will call back to reschedule.   Thank you

## 2021-01-10 ENCOUNTER — Ambulatory Visit (INDEPENDENT_AMBULATORY_CARE_PROVIDER_SITE_OTHER): Payer: Medicare Other | Admitting: Cardiology

## 2021-01-10 ENCOUNTER — Encounter: Payer: Self-pay | Admitting: Cardiology

## 2021-01-10 ENCOUNTER — Other Ambulatory Visit: Payer: Self-pay

## 2021-01-10 VITALS — BP 106/52 | HR 100 | Ht 63.0 in | Wt 148.2 lb

## 2021-01-10 DIAGNOSIS — E7800 Pure hypercholesterolemia, unspecified: Secondary | ICD-10-CM | POA: Diagnosis not present

## 2021-01-10 DIAGNOSIS — I2510 Atherosclerotic heart disease of native coronary artery without angina pectoris: Secondary | ICD-10-CM

## 2021-01-10 DIAGNOSIS — N189 Chronic kidney disease, unspecified: Secondary | ICD-10-CM | POA: Diagnosis not present

## 2021-01-10 DIAGNOSIS — I10 Essential (primary) hypertension: Secondary | ICD-10-CM

## 2021-01-10 DIAGNOSIS — I350 Nonrheumatic aortic (valve) stenosis: Secondary | ICD-10-CM

## 2021-01-10 MED ORDER — NITROGLYCERIN 0.4 MG SL SUBL: 0.4000 mg | SUBLINGUAL_TABLET | SUBLINGUAL | 3 refills | Status: DC | PRN

## 2021-01-10 NOTE — Patient Instructions (Signed)
Medication Instructions:  Your physician has recommended you make the following change in your medication:  START: Nitroglycerin 0.4 mg take one tablet by mouth every 5 minutes up to three times as needed for chest pain.  *If you need a refill on your cardiac medications before your next appointment, please call your pharmacy*   Lab Work: NoneNone If you have labs (blood work) drawn today and your tests are completely normal, you will receive your results only by: [place] (if you have MyChart) OR A paper copy in the mail If you have any lab test that is abnormal or we need to change your treatment, we will call you to review the results.   Testing/Procedures: NOne   Follow-Up: At [facility], you and your health needs are our priority.  As part of our continuing mission to provide you with exceptional heart care, we have created designated Provider Care Teams.  These Care Teams include your primary Cardiologist (physician) and Advanced Practice Providers (APPs -  Physician Assistants and Nurse Practitioners) who all work together to provide you with the care you need, when you need it.  We recommend signing up for the patient portal called "MyChart".  Sign up information is provided on this After Visit Summary.  MyChart is used to connect with patients for Virtual Visits (Telemedicine).  Patients are able to view lab/test results, encounter notes, upcoming appointments, etc.  Non-urgent messages can be sent to your provider as well.   To learn more about what you can do with MyChart, go to [email or web address].    Your next appointment:   1 year(s)  The format for your next appointment:   In Person  Provider:   Shirlee More, MD   Other Instructions

## 2021-01-11 ENCOUNTER — Encounter: Payer: Medicare Other | Admitting: Gastroenterology

## 2021-01-13 ENCOUNTER — Other Ambulatory Visit: Payer: Self-pay

## 2021-01-13 ENCOUNTER — Encounter: Payer: Self-pay | Admitting: Vascular Surgery

## 2021-01-13 ENCOUNTER — Ambulatory Visit (INDEPENDENT_AMBULATORY_CARE_PROVIDER_SITE_OTHER): Payer: Medicare Other | Admitting: Vascular Surgery

## 2021-01-13 ENCOUNTER — Ambulatory Visit (HOSPITAL_COMMUNITY)
Admission: RE | Admit: 2021-01-13 | Discharge: 2021-01-13 | Disposition: A | Discharge status: Home / Self Care | Payer: Medicare Other | Source: Ambulatory Visit | Attending: Vascular Surgery | Admitting: Vascular Surgery

## 2021-01-13 VITALS — BP 134/70 | HR 91 | Temp 97.9°F | Resp 20 | Ht 63.0 in | Wt 149.0 lb

## 2021-01-13 DIAGNOSIS — I6523 Occlusion and stenosis of bilateral carotid arteries: Secondary | ICD-10-CM

## 2021-01-13 NOTE — Progress Notes (Signed)
Patient ID: Meredith Young, female   DOB: 05-Oct-1945, 75 y.o.   MRN: [id number]  Reason for Consult: Follow-up   Referred by Nicoletta Dress, MD  Subjective:     HPI:  Meredith Young is a 75 y.o. female was initially treated with right carotid endarterectomy for symptomatic disease.  Incidentally this was the first carotid endarterectomy that I performed at [facility].  She has had no symptoms since then.  Since her last visit she has been caring for her mother who unfortunately passed away a few months ago.  She now follows up with no new symptoms.  She remains active with her children and grandchildren.  She does take aspirin which causes significant bruising but she does not take a statin.  She does have known aortic stenosis for which she is followed by cardiology.  Other risk factors for vascular disease include diabetes, hyperlipidemia, hypertension.  Past Medical History:  Diagnosis Date   Ankle fracture, left, closed, initial encounter 02/02/2014   Anxiety 04/28/2014   Aortic stenosis 02/24/2015   Overview:  Very mild Nov 2015   Carotid artery occlusion    Carotid stenosis, bilateral 02/27/2016   Overview:  R ICA > 70%L ICA 50-69%   Cerebrovascular disease 04/28/2014   Chronic right shoulder pain 4/85/4627   Complication of anesthesia    Depression 04/28/2014   Diabetes mellitus type II, controlled ([place]) 04/28/2014   Diabetes mellitus without complication ([place])    Diverticulitis of colon    Essential hypertension 04/28/2014   GERD (gastroesophageal reflux disease)    Gout    Gout    Headache    History of colonic polyps 04/28/2014   History of hiatal hernia    Hx of colonic polyps    Hyperlipidemia    Hypertension    HEART BEATS FAST    Hypothyroidism    Idiopathic peripheral neuropathy    Ingrown nail of great toe of left foot 02/11/2017   Ingrown nail of great toe of right foot 01/04/2017   Low back pain 04/28/2014   OA (osteoarthritis) 04/28/2014    Occlusion and stenosis of carotid artery without mention of cerebral infarction 11/25/2012   Osteoarthritis    Palpitation 01/27/2016   Paronychia of great toe, left 02/11/2017   Paronychia of great toe, right 01/04/2017   Pernicious anemia 04/28/2014   PONV (postoperative nausea and vomiting)    RLS (restless legs syndrome) 04/28/2014   Rosacea 04/28/2014   S/P rotator cuff repair 09/17/2016   TIA (transient ischemic attack)    01/2016   Family History  Problem Relation Age of Onset   Hypertension Mother    Heart disease Mother    COPD Mother    Stroke Father    Diabetes Father    Heart disease Father        before age 62   Hyperlipidemia Father    Hypertension Father    Other Father        amputation of great toe   Diabetes Sister    Colon cancer Neg Hx    Pancreatic cancer Neg Hx    Stomach cancer Neg Hx    Esophageal cancer Neg Hx    Liver disease Neg Hx    Past Surgical History:  Procedure Laterality Date   ABDOMINAL HYSTERECTOMY  1977   ANKLE SURGERY     LEFT   APPENDECTOMY  1967   CHOLECYSTECTOMY  1991   COLONOSCOPY  06/04/2011   Colonic polyps status  post polypectomy. MOderate redominantly sigmoid diverticuolosis. Small internal hemorrhoids.   ENDARTERECTOMY Right 02/27/2016   Procedure: RIGHT ENDARTERECTOMY CAROTID;  Surgeon: Waynetta Sandy, MD;  Location: [facility] OR;  Service: Vascular;  Laterality: Right;   ESOPHAGOGASTRODUODENOSCOPY  01/21/2008   Erosive gastritis. Otherwise normal EGD   LEFT HEART CATH AND CORONARY ANGIOGRAPHY N/A 01/03/2021   Procedure: LEFT HEART CATH AND CORONARY ANGIOGRAPHY;  Surgeon: Leonie Man, MD;  Location: [facility];  Service: Cardiovascular;  Laterality: N/A;   NASAL SINUS SURGERY  1975   PATCH ANGIOPLASTY Right 02/27/2016   Procedure: PATCH ANGIOPLASTY USING [employer] Bash BIOLOGIC PATCH;  Surgeon: Waynetta Sandy, MD;  Location: [facility] OR;  Service: Vascular;  Laterality: Right;   POLYPECTOMY  1985   TOOTH  EXTRACTION      Short Social History:  Social History   Tobacco Use   Smoking status: Former    Pack years: 0.00    Types: Cigarettes    Start date: 07/16/1988   Smokeless tobacco: Never  Substance Use Topics   Alcohol use: No    Allergies  Allergen Reactions   Statins Other (See Comments)    Leg aches, Myalgias Leg aches, Myalgias    Codeine Nausea And Vomiting   Demerol [Meperidine] Nausea And Vomiting and Other (See Comments)    Violent behavior/reaction   Diltiazem Other (See Comments)    UNSPECIFIED , Unknown   Metformin Other (See Comments)    UNSPECIFIED   Metformin And Related Other (See Comments)    UNSPECIFIED     Current Outpatient Medications  Medication Sig Dispense Refill   acetaminophen (TYLENOL) 500 MG tablet Take 500 mg by mouth every 6 (six) hours as needed for mild pain or moderate pain.     allopurinol (ZYLOPRIM) 300 MG tablet Take 300 mg by mouth daily in the afternoon. midday     amLODipine (NORVASC) 5 MG tablet Take 5 mg by mouth daily in the afternoon. midday  0   aspirin EC 81 MG tablet Take 81 mg by mouth in the morning and at bedtime. (Midday & bedtime)     Dulaglutide (TRULICITY) 1.5 JK/9.3OI SOPN Inject 1.5 mg into the skin every Thursday.     fexofenadine (ALLEGRA) 180 MG tablet Take 180 mg by mouth daily as needed for allergies or rhinitis.     folic acid (FOLVITE) 1 MG tablet Take 1 mg by mouth 2 (two) times a week.     glimepiride (AMARYL) 1 MG tablet Take 1 mg by mouth daily as needed (blood sugar greater 150.).     levothyroxine (SYNTHROID) 88 MCG tablet Take 88 mcg by mouth daily before breakfast.     LORazepam (ATIVAN) 0.5 MG tablet Take 0.5 mg by mouth 2 (two) times daily as needed (panic attacks).     losartan (COZAAR) 100 MG tablet Take 100 mg by mouth daily at 10 pm. (2200)  3   metoprolol tartrate (LOPRESSOR) 25 MG tablet Take 12.5 mg by mouth 2 (two) times daily.     Multiple Vitamins-Minerals (MULTIVITAMIN PO) Take 1 tablet by  mouth daily.      nitroGLYCERIN (NITROSTAT) 0.4 MG SL tablet Place 1 tablet (0.4 mg total) under the tongue every 5 (five) minutes as needed. 30 tablet 3   omeprazole (PRILOSEC) 20 MG capsule Take 20 mg by mouth daily in the afternoon. midday     No current facility-administered medications for this visit.    Review of Systems  Constitutional:  Constitutional negative. HENT:  HENT negative.  Eyes: Eyes negative.  Respiratory: Respiratory negative.  Cardiovascular: Cardiovascular negative.  GI: Gastrointestinal negative.  Musculoskeletal: Positive for leg pain.  Skin: Skin negative.  Neurological: Positive for dizziness.  Hematologic: Positive for bruises/bleeds easily.  Psychiatric: Psychiatric negative.       Objective:  Objective   Vitals:   01/13/21 1137 01/13/21 1142  BP: 130/72 134/70  Pulse: 91   Resp: 20   Temp: 97.9 F (36.6 C)   Weight: 149 lb (67.6 kg)   Height: 5\' 3"  (1.6 m)    Body mass index is 26.39 kg/m.  Physical Exam HENT:     Head: Normocephalic.     Nose:     Comments: Wearing a mask Eyes:     Pupils: Pupils are equal, round, and reactive to light.  Neck:     Vascular: Carotid bruit present.  Abdominal:     General: Abdomen is flat.     Palpations: Abdomen is soft.  Musculoskeletal:        General: Normal range of motion.  Skin:    General: Skin is warm and dry.  Neurological:     General: No focal deficit present.     Mental Status: She is alert.     Cranial Nerves: No cranial nerve deficit.  Psychiatric:        Mood and Affect: Mood normal.        Behavior: Behavior normal.        Thought Content: Thought content normal.        Judgment: Judgment normal.    Data: Right Carotid Findings:  +----------+--------+--------+--------+------------------+--------+            PSV cm/sEDV cm/sStenosisPlaque DescriptionComments  +----------+--------+--------+--------+------------------+--------+  CCA Prox  154     19                                           +----------+--------+--------+--------+------------------+--------+  CCA Mid   90      15              homogeneous                 +----------+--------+--------+--------+------------------+--------+  CCA Distal89      15              homogeneous                 +----------+--------+--------+--------+------------------+--------+  ICA Prox  133     31      1-39%   homogeneous                 +----------+--------+--------+--------+------------------+--------+  ICA Mid   125     28                                          +----------+--------+--------+--------+------------------+--------+  ICA Distal93      26                                          +----------+--------+--------+--------+------------------+--------+  ECA       106     0               heterogenous                +----------+--------+--------+--------+------------------+--------+   +----------+--------+-------+----------------+-------------------+  PSV cm/sEDV cmsDescribe        Arm Pressure (mmHG)  +----------+--------+-------+----------------+-------------------+  [id number]            Multiphasic, WNL                     +----------+--------+-------+----------------+-------------------+   +---------+--------+--+--------+--+---------+  VertebralPSV cm/s56EDV cm/s12Antegrade  +---------+--------+--+--------+--+---------+       Left Carotid Findings:  +----------+-------+-------+--------+---------------------------------+----  ----+            PSV    EDV    StenosisPlaque Description                Comments            cm/s   cm/s                                                        +----------+-------+-------+--------+---------------------------------+----  ----+  CCA Prox  92     24                                                           +----------+-------+-------+--------+---------------------------------+----  ----+  CCA Mid   92     19                                                          +----------+-------+-------+--------+---------------------------------+----  ----+  CCA [id number]    21             calcific, heterogenous and                                                   irregular                                    +----------+-------+-------+--------+---------------------------------+----  ----+  ICA Prox  273    73     60-79%  calcific, irregular and                                                      heterogenous                                 +----------+-------+-------+--------+---------------------------------+----  ----+  ICA Mid   130    34                                                          +----------+-------+-------+--------+---------------------------------+----  ----+  ICA Distal80     23                                                          +----------+-------+-------+--------+---------------------------------+----  ----+  ECA       211    1      >50%    heterogenous                                 +----------+-------+-------+--------+---------------------------------+----  ----+   +----------+--------+--------+----------------+-------------------+            PSV cm/sEDV cm/sDescribe        Arm Pressure (mmHG)  +----------+--------+--------+----------------+-------------------+  [id number]             Multiphasic, WNL                     +----------+--------+--------+----------------+-------------------+   +---------+--------+--+--------+-+---------+  VertebralPSV cm/s51EDV cm/s8Antegrade  +---------+--------+--+--------+-+---------+           Summary:  Right Carotid: Velocities in the right ICA are consistent with a 1-39%  stenosis.   Left Carotid: Velocities in the left ICA are  consistent with a 60-79%  stenosis.                The ECA appears >50% stenosed.      Assessment/Plan:     75 year old female here for follow-up previous right carotid endarterectomy for symptomatic disease.  She remains asymptomatic since that time.  She does have a progression of her disease on the left and for this we will follow her up in 6 months.  She also has a new bruit on that side.  She remains on aspirin she is intolerant to statins.  She will follow-up in 6 months and I discussed the possible outcomes which are stable disease, progressive disease or possibly even duplex does not demonstrate significant stenosis found today.  She demonstrates good understanding we will see her in 6 months.     Waynetta Sandy MD [facility]

## 2021-01-17 ENCOUNTER — Encounter: Payer: Self-pay | Admitting: *Deleted

## 2021-01-17 ENCOUNTER — Other Ambulatory Visit: Payer: Self-pay

## 2021-01-17 DIAGNOSIS — I6523 Occlusion and stenosis of bilateral carotid arteries: Secondary | ICD-10-CM

## 2021-01-18 ENCOUNTER — Ambulatory Visit: Payer: Medicare Other | Admitting: Cardiology

## 2021-01-23 ENCOUNTER — Ambulatory Visit: Payer: Medicare Other | Admitting: Cardiology

## 2021-03-27 DIAGNOSIS — M545 Low back pain, unspecified: Secondary | ICD-10-CM | POA: Diagnosis not present

## 2021-03-27 DIAGNOSIS — M546 Pain in thoracic spine: Secondary | ICD-10-CM | POA: Diagnosis not present

## 2021-04-03 DIAGNOSIS — E785 Hyperlipidemia, unspecified: Secondary | ICD-10-CM | POA: Diagnosis not present

## 2021-04-03 DIAGNOSIS — Z23 Encounter for immunization: Secondary | ICD-10-CM | POA: Diagnosis not present

## 2021-04-03 DIAGNOSIS — E039 Hypothyroidism, unspecified: Secondary | ICD-10-CM | POA: Diagnosis not present

## 2021-04-03 DIAGNOSIS — E1142 Type 2 diabetes mellitus with diabetic polyneuropathy: Secondary | ICD-10-CM | POA: Diagnosis not present

## 2021-04-03 DIAGNOSIS — M109 Gout, unspecified: Secondary | ICD-10-CM | POA: Diagnosis not present

## 2021-04-03 DIAGNOSIS — I10 Essential (primary) hypertension: Secondary | ICD-10-CM | POA: Diagnosis not present

## 2021-04-03 DIAGNOSIS — Z8673 Personal history of transient ischemic attack (TIA), and cerebral infarction without residual deficits: Secondary | ICD-10-CM | POA: Diagnosis not present

## 2021-04-06 DIAGNOSIS — H35372 Puckering of macula, left eye: Secondary | ICD-10-CM | POA: Diagnosis not present

## 2021-04-06 DIAGNOSIS — E119 Type 2 diabetes mellitus without complications: Secondary | ICD-10-CM | POA: Diagnosis not present

## 2021-04-10 DIAGNOSIS — E785 Hyperlipidemia, unspecified: Secondary | ICD-10-CM | POA: Diagnosis not present

## 2021-04-10 DIAGNOSIS — I129 Hypertensive chronic kidney disease with stage 1 through stage 4 chronic kidney disease, or unspecified chronic kidney disease: Secondary | ICD-10-CM | POA: Diagnosis not present

## 2021-04-10 DIAGNOSIS — E1122 Type 2 diabetes mellitus with diabetic chronic kidney disease: Secondary | ICD-10-CM | POA: Diagnosis not present

## 2021-04-10 DIAGNOSIS — N1832 Chronic kidney disease, stage 3b: Secondary | ICD-10-CM | POA: Diagnosis not present

## 2021-04-10 DIAGNOSIS — D631 Anemia in chronic kidney disease: Secondary | ICD-10-CM | POA: Diagnosis not present

## 2021-04-10 DIAGNOSIS — N2581 Secondary hyperparathyroidism of renal origin: Secondary | ICD-10-CM | POA: Diagnosis not present

## 2021-04-10 DIAGNOSIS — N189 Chronic kidney disease, unspecified: Secondary | ICD-10-CM | POA: Diagnosis not present

## 2021-04-10 DIAGNOSIS — I350 Nonrheumatic aortic (valve) stenosis: Secondary | ICD-10-CM | POA: Diagnosis not present

## 2021-04-13 ENCOUNTER — Other Ambulatory Visit: Payer: Self-pay | Admitting: Nephrology

## 2021-04-13 DIAGNOSIS — N1832 Chronic kidney disease, stage 3b: Secondary | ICD-10-CM

## 2021-04-17 DIAGNOSIS — R300 Dysuria: Secondary | ICD-10-CM | POA: Diagnosis not present

## 2021-04-20 ENCOUNTER — Ambulatory Visit
Admission: RE | Admit: 2021-04-20 | Discharge: 2021-04-20 | Disposition: A | Discharge status: Home / Self Care | Payer: Medicare Other | Source: Ambulatory Visit | Attending: Nephrology | Admitting: Nephrology

## 2021-04-20 DIAGNOSIS — N1832 Chronic kidney disease, stage 3b: Secondary | ICD-10-CM

## 2021-04-20 DIAGNOSIS — N261 Atrophy of kidney (terminal): Secondary | ICD-10-CM | POA: Diagnosis not present

## 2021-06-13 DIAGNOSIS — E1142 Type 2 diabetes mellitus with diabetic polyneuropathy: Secondary | ICD-10-CM | POA: Diagnosis not present

## 2021-07-03 DIAGNOSIS — Z8673 Personal history of transient ischemic attack (TIA), and cerebral infarction without residual deficits: Secondary | ICD-10-CM | POA: Diagnosis not present

## 2021-07-03 DIAGNOSIS — M109 Gout, unspecified: Secondary | ICD-10-CM | POA: Diagnosis not present

## 2021-07-03 DIAGNOSIS — J069 Acute upper respiratory infection, unspecified: Secondary | ICD-10-CM | POA: Diagnosis not present

## 2021-07-03 DIAGNOSIS — I10 Essential (primary) hypertension: Secondary | ICD-10-CM | POA: Diagnosis not present

## 2021-07-03 DIAGNOSIS — E039 Hypothyroidism, unspecified: Secondary | ICD-10-CM | POA: Diagnosis not present

## 2021-07-03 DIAGNOSIS — E1142 Type 2 diabetes mellitus with diabetic polyneuropathy: Secondary | ICD-10-CM | POA: Diagnosis not present

## 2021-07-03 DIAGNOSIS — E785 Hyperlipidemia, unspecified: Secondary | ICD-10-CM | POA: Diagnosis not present

## 2021-07-18 DIAGNOSIS — G2581 Restless legs syndrome: Secondary | ICD-10-CM | POA: Diagnosis not present

## 2021-07-19 ENCOUNTER — Ambulatory Visit (HOSPITAL_COMMUNITY): Payer: Medicare Other

## 2021-07-19 ENCOUNTER — Ambulatory Visit: Payer: Medicare Other | Admitting: Vascular Surgery

## 2021-07-19 DIAGNOSIS — R6883 Chills (without fever): Secondary | ICD-10-CM | POA: Diagnosis not present

## 2021-07-19 DIAGNOSIS — B9689 Other specified bacterial agents as the cause of diseases classified elsewhere: Secondary | ICD-10-CM | POA: Diagnosis not present

## 2021-07-19 DIAGNOSIS — R112 Nausea with vomiting, unspecified: Secondary | ICD-10-CM | POA: Diagnosis not present

## 2021-07-19 DIAGNOSIS — J019 Acute sinusitis, unspecified: Secondary | ICD-10-CM | POA: Diagnosis not present

## 2021-08-07 DIAGNOSIS — I350 Nonrheumatic aortic (valve) stenosis: Secondary | ICD-10-CM | POA: Diagnosis not present

## 2021-08-07 DIAGNOSIS — I129 Hypertensive chronic kidney disease with stage 1 through stage 4 chronic kidney disease, or unspecified chronic kidney disease: Secondary | ICD-10-CM | POA: Diagnosis not present

## 2021-08-07 DIAGNOSIS — E1122 Type 2 diabetes mellitus with diabetic chronic kidney disease: Secondary | ICD-10-CM | POA: Diagnosis not present

## 2021-08-07 DIAGNOSIS — D631 Anemia in chronic kidney disease: Secondary | ICD-10-CM | POA: Diagnosis not present

## 2021-08-07 DIAGNOSIS — N2581 Secondary hyperparathyroidism of renal origin: Secondary | ICD-10-CM | POA: Diagnosis not present

## 2021-08-07 DIAGNOSIS — N1832 Chronic kidney disease, stage 3b: Secondary | ICD-10-CM | POA: Diagnosis not present

## 2021-08-07 DIAGNOSIS — H1132 Conjunctival hemorrhage, left eye: Secondary | ICD-10-CM | POA: Diagnosis not present

## 2021-08-07 DIAGNOSIS — E785 Hyperlipidemia, unspecified: Secondary | ICD-10-CM | POA: Diagnosis not present

## 2021-08-08 DIAGNOSIS — H6691 Otitis media, unspecified, right ear: Secondary | ICD-10-CM | POA: Diagnosis not present

## 2021-08-08 DIAGNOSIS — H6121 Impacted cerumen, right ear: Secondary | ICD-10-CM | POA: Diagnosis not present

## 2021-08-16 ENCOUNTER — Encounter: Payer: Self-pay | Admitting: Vascular Surgery

## 2021-08-16 ENCOUNTER — Other Ambulatory Visit: Payer: Self-pay

## 2021-08-16 ENCOUNTER — Ambulatory Visit (INDEPENDENT_AMBULATORY_CARE_PROVIDER_SITE_OTHER): Payer: Medicare Other | Admitting: Vascular Surgery

## 2021-08-16 ENCOUNTER — Ambulatory Visit (HOSPITAL_COMMUNITY)
Admission: RE | Admit: 2021-08-16 | Discharge: 2021-08-16 | Disposition: A | Discharge status: Home / Self Care | Payer: Medicare Other | Source: Ambulatory Visit | Attending: Vascular Surgery | Admitting: Vascular Surgery

## 2021-08-16 VITALS — BP 123/64 | HR 82 | Temp 97.7°F | Resp 20 | Ht 63.0 in | Wt 151.0 lb

## 2021-08-16 DIAGNOSIS — I70219 Atherosclerosis of native arteries of extremities with intermittent claudication, unspecified extremity: Secondary | ICD-10-CM | POA: Diagnosis not present

## 2021-08-16 DIAGNOSIS — I6523 Occlusion and stenosis of bilateral carotid arteries: Secondary | ICD-10-CM | POA: Insufficient documentation

## 2021-08-16 NOTE — Progress Notes (Signed)
Patient ID: Meredith Young, female   DOB: 1946/01/01, 76 y.o.   MRN: [id number]  Reason for Consult: Follow-up   Referred by Nicoletta Dress, MD  Subjective:     HPI:  Meredith Young is a 76 y.o. female history of a right carotid endarterectomy for symptomatic disease.  She has been followed for left carotid stenosis which at last visit did increase with a systolic velocity in the 174Y.  This remains asymptomatic.  She is on aspirin she cannot take statins due to pain.  She states more recently she has had bilateral lower extremity rest pain which may be multifactorial but also has very short distance claudication.  She denies any previous history of vascular events in the bilateral lower extremities.  She does have known aortic stenosis followed by Dr. Bettina Gavia.  No new history of stroke, TIA or amaurosis.  Past Medical History:  Diagnosis Date   Ankle fracture, left, closed, initial encounter 02/02/2014   Anxiety 04/28/2014   Aortic stenosis 02/24/2015   Overview:  Very mild Nov 2015   Carotid artery occlusion    Carotid stenosis, bilateral 02/27/2016   Overview:  R ICA > 70%L ICA 50-69%   Cerebrovascular disease 04/28/2014   Chronic right shoulder pain 02/26/4817   Complication of anesthesia    Depression 04/28/2014   Diabetes mellitus type II, controlled ([place]) 04/28/2014   Diabetes mellitus without complication ([place])    Diverticulitis of colon    Essential hypertension 04/28/2014   GERD (gastroesophageal reflux disease)    Gout    Gout    Headache    History of colonic polyps 04/28/2014   History of hiatal hernia    Hx of colonic polyps    Hyperlipidemia    Hypertension    HEART BEATS FAST    Hypothyroidism    Idiopathic peripheral neuropathy    Ingrown nail of great toe of left foot 02/11/2017   Ingrown nail of great toe of right foot 01/04/2017   Low back pain 04/28/2014   OA (osteoarthritis) 04/28/2014   Occlusion and stenosis of carotid artery without mention of  cerebral infarction 11/25/2012   Osteoarthritis    Palpitation 01/27/2016   Paronychia of great toe, left 02/11/2017   Paronychia of great toe, right 01/04/2017   Pernicious anemia 04/28/2014   PONV (postoperative nausea and vomiting)    RLS (restless legs syndrome) 04/28/2014   Rosacea 04/28/2014   S/P rotator cuff repair 09/17/2016   TIA (transient ischemic attack)    01/2016   Family History  Problem Relation Age of Onset   Hypertension Mother    Heart disease Mother    COPD Mother    Stroke Father    Diabetes Father    Heart disease Father        before age 72   Hyperlipidemia Father    Hypertension Father    Other Father        amputation of great toe   Diabetes Sister    Colon cancer Neg Hx    Pancreatic cancer Neg Hx    Stomach cancer Neg Hx    Esophageal cancer Neg Hx    Liver disease Neg Hx    Past Surgical History:  Procedure Laterality Date   ABDOMINAL HYSTERECTOMY  1977   ANKLE SURGERY     LEFT   APPENDECTOMY  1967   CHOLECYSTECTOMY  1991   COLONOSCOPY  06/04/2011   Colonic polyps status post polypectomy. MOderate redominantly sigmoid diverticuolosis. Small  internal hemorrhoids.   ENDARTERECTOMY Right 02/27/2016   Procedure: RIGHT ENDARTERECTOMY CAROTID;  Surgeon: Waynetta Sandy, MD;  Location: [facility] OR;  Service: Vascular;  Laterality: Right;   ESOPHAGOGASTRODUODENOSCOPY  01/21/2008   Erosive gastritis. Otherwise normal EGD   LEFT HEART CATH AND CORONARY ANGIOGRAPHY N/A 01/03/2021   Procedure: LEFT HEART CATH AND CORONARY ANGIOGRAPHY;  Surgeon: Leonie Man, MD;  Location: [facility];  Service: Cardiovascular;  Laterality: N/A;   NASAL SINUS SURGERY  1975   PATCH ANGIOPLASTY Right 02/27/2016   Procedure: PATCH ANGIOPLASTY USING Rue[employer] Bash BIOLOGIC PATCH;  Surgeon: Waynetta Sandy, MD;  Location[facility] OR;  Service: Vascular;  Laterality: Right;   POLYPECTOMY  1985   TOOTH EXTRACTION      Short Social History:  Social History    Tobacco Use   Smoking status: Former    Types: Cigarettes    Start date: 07/16/1988   Smokeless tobacco: Never  Substance Use Topics   Alcohol use: No    Allergies  Allergen Reactions   Statins Other (See Comments)    Leg aches, Myalgias Leg aches, Myalgias    Codeine Nausea And Vomiting   Demerol [Meperidine] Nausea And Vomiting and Other (See Comments)    Violent behavior/reaction   Diltiazem Other (See Comments)    UNSPECIFIED , Unknown   Metformin Other (See Comments)    UNSPECIFIED   Metformin And Related Other (See Comments)    UNSPECIFIED     Current Outpatient Medications  Medication Sig Dispense Refill   acetaminophen (TYLENOL) 500 MG tablet Take 500 mg by mouth every 6 (six) hours as needed for mild pain or moderate pain.     allopurinol (ZYLOPRIM) 300 MG tablet Take 300 mg by mouth daily in the afternoon. midday     amLODipine (NORVASC) 5 MG tablet Take 5 mg by mouth daily in the afternoon. midday  0   aspirin EC 81 MG tablet Take 81 mg by mouth in the morning and at bedtime. (Midday & bedtime)     Dulaglutide (TRULICITY) 1.5 UX/3.2TF SOPN Inject 1.5 mg into the skin every Thursday.     fexofenadine (ALLEGRA) 180 MG tablet Take 180 mg by mouth daily as needed for allergies or rhinitis.     folic acid (FOLVITE) 1 MG tablet Take 1 mg by mouth 2 (two) times a week.     glimepiride (AMARYL) 1 MG tablet Take 1 mg by mouth daily as needed (blood sugar greater 150.).     levothyroxine (SYNTHROID) 88 MCG tablet Take 88 mcg by mouth daily before breakfast.     LORazepam (ATIVAN) 0.5 MG tablet Take 0.5 mg by mouth 2 (two) times daily as needed (panic attacks).     losartan (COZAAR) 100 MG tablet Take 100 mg by mouth daily at 10 pm. (2200)  3   metoprolol tartrate (LOPRESSOR) 25 MG tablet Take 12.5 mg by mouth 2 (two) times daily.     Multiple Vitamins-Minerals (MULTIVITAMIN PO) Take 1 tablet by mouth daily.      omeprazole (PRILOSEC) 20 MG capsule Take 20 mg by mouth  daily in the afternoon. midday     pramipexole (MIRAPEX) 0.5 MG tablet Take 0.5 mg by mouth at bedtime.     nitroGLYCERIN (NITROSTAT) 0.4 MG SL tablet Place 1 tablet (0.4 mg total) under the tongue every 5 (five) minutes as needed. 30 tablet 3   No current facility-administered medications for this visit.    Review of Systems  Constitutional:  Constitutional negative. HENT: HENT negative.  Eyes: Eyes negative.  Respiratory: Respiratory negative.  Cardiovascular: Cardiovascular negative.  GI: Gastrointestinal negative.  Musculoskeletal: Positive for leg pain.  Neurological: Neurological negative. Hematologic: Hematologic/lymphatic negative.  Psychiatric: Psychiatric negative.       Objective:  Objective   Vitals:   08/16/21 1126  BP: 123/64  Pulse: 82  Resp: 20  Temp: 97.7 F (36.5 C)  SpO2: 98%  Weight: 151 lb (68.5 kg)  Height: 5\' 3"  (1.6 m)   Body mass index is 26.75 kg/m.  Physical Exam HENT:     Head: Normocephalic.     Nose:     Comments: Wearing a mask Eyes:     Pupils: Pupils are equal, round, and reactive to light.  Neck:     Comments: Possible bilateral bruits although possibly transmitted murmur Cardiovascular:     Pulses:          Femoral pulses are 0 on the right side and 0 on the left side.      Popliteal pulses are 0 on the right side and 0 on the left side.       Dorsalis pedis pulses are 0 on the right side and 0 on the left side.       Posterior tibial pulses are 0 on the right side and 0 on the left side.  Pulmonary:     Effort: Pulmonary effort is normal.  Abdominal:     General: Abdomen is flat.     Palpations: Abdomen is soft. There is no mass.  Musculoskeletal:        General: Normal range of motion.     Cervical back: Normal range of motion.     Right lower leg: No edema.     Left lower leg: No edema.  Skin:    General: Skin is warm.     Capillary Refill: Capillary refill takes less than 2 seconds.     Findings: No lesion or  rash.  Neurological:     General: No focal deficit present.     Mental Status: She is alert.  Psychiatric:        Mood and Affect: Mood normal.        Behavior: Behavior normal.        Thought Content: Thought content normal.    Data: Right Carotid Findings:  +----------+--------+--------+--------+------------------+--------+              PSV cm/s EDV cm/s Stenosis Plaque Description Comments   +----------+--------+--------+--------+------------------+--------+   CCA Prox   156      10                                              +----------+--------+--------+--------+------------------+--------+   CCA Mid    122      11                heterogenous                  +----------+--------+--------+--------+------------------+--------+   CCA Distal 104      21                heterogenous                  +----------+--------+--------+--------+------------------+--------+   ICA Prox   167      35  1-39%    heterogenous                  +----------+--------+--------+--------+------------------+--------+   ICA Mid    117      26                                              +----------+--------+--------+--------+------------------+--------+   ICA Distal 107      30                                              +----------+--------+--------+--------+------------------+--------+   ECA        106      4                 heterogenous                  +----------+--------+--------+--------+------------------+--------+   +----------+--------+-------+----------------+-------------------+              PSV cm/s EDV cms Describe         Arm Pressure (mmHG)   +----------+--------+-------+----------------+-------------------+   Subclavian 140              Multiphasic, WNL                       +----------+--------+-------+----------------+-------------------+   +---------+--------+--+--------+--+---------+   Vertebral PSV cm/s 58 EDV cm/s 14 Antegrade    +---------+--------+--+--------+--+---------+       Left Carotid Findings:  +----------+--------+--------+--------+-------------------------+--------+              PSV cm/s EDV cm/s Stenosis Plaque Description        Comments   +----------+--------+--------+--------+-------------------------+--------+   CCA Prox   82       16                                                     +----------+--------+--------+--------+-------------------------+--------+   CCA Mid    88       20                                                     +----------+--------+--------+--------+-------------------------+--------+   CCA Distal 90       16                heterogenous and calcific            +----------+--------+--------+--------+-------------------------+--------+   ICA Prox   243      68       60-79%   heterogenous and calcific            +----------+--------+--------+--------+-------------------------+--------+   ICA Mid    207      48                                                     +----------+--------+--------+--------+-------------------------+--------+  ICA Distal 118      29                                                     +----------+--------+--------+--------+-------------------------+--------+   ECA        223      12       >50%     heterogenous                         +----------+--------+--------+--------+-------------------------+--------+   +----------+--------+--------+----------------+-------------------+              PSV cm/s EDV cm/s Describe         Arm Pressure (mmHG)   +----------+--------+--------+----------------+-------------------+   Subclavian 140               Multiphasic, WNL                       +----------+--------+--------+----------------+-------------------+   +---------+--------+--+--------+--+---------+   Vertebral PSV cm/s 54 EDV cm/s 16 Antegrade   +---------+--------+--+--------+--+---------+      Summary:  Right Carotid: Velocities in the right ICA  are consistent with a 1-39%  stenosis.   Left Carotid: Velocities in the left ICA are consistent with a 60-79%  stenosis.      Assessment/Plan:    76 year old female with stable left ICA stenosis that is asymptomatic after previous right carotid endarterectomy for symptomatic disease.  She does have rest pain in the bilateral lower extremities without any palpable pulses I have ordered bilateral lower extremity duplex with ABIs to follow-up in 2 to 3 weeks.  I will see her back in 1 year to follow her carotid arteries and I will schedule this at the next appointment.  She will continue aspirin may need to be considered for evaluation PCSK9 inhibitor.    Waynetta Sandy MD [facility]

## 2021-08-21 ENCOUNTER — Other Ambulatory Visit: Payer: Self-pay | Admitting: *Deleted

## 2021-08-21 DIAGNOSIS — I70219 Atherosclerosis of native arteries of extremities with intermittent claudication, unspecified extremity: Secondary | ICD-10-CM

## 2021-08-31 DIAGNOSIS — H1132 Conjunctival hemorrhage, left eye: Secondary | ICD-10-CM | POA: Diagnosis not present

## 2021-09-13 ENCOUNTER — Ambulatory Visit (INDEPENDENT_AMBULATORY_CARE_PROVIDER_SITE_OTHER)
Admission: RE | Admit: 2021-09-13 | Discharge: 2021-09-13 | Disposition: A | Discharge status: Home / Self Care | Payer: Medicare Other | Source: Ambulatory Visit | Attending: Vascular Surgery | Admitting: Vascular Surgery

## 2021-09-13 ENCOUNTER — Ambulatory Visit (INDEPENDENT_AMBULATORY_CARE_PROVIDER_SITE_OTHER): Payer: Medicare Other | Admitting: Vascular Surgery

## 2021-09-13 ENCOUNTER — Other Ambulatory Visit: Payer: Self-pay

## 2021-09-13 ENCOUNTER — Ambulatory Visit (HOSPITAL_COMMUNITY)
Admission: RE | Admit: 2021-09-13 | Discharge: 2021-09-13 | Disposition: A | Discharge status: Home / Self Care | Payer: Medicare Other | Source: Ambulatory Visit | Attending: Vascular Surgery | Admitting: Vascular Surgery

## 2021-09-13 ENCOUNTER — Encounter: Payer: Self-pay | Admitting: Vascular Surgery

## 2021-09-13 VITALS — BP 137/73 | HR 87 | Temp 98.4°F | Resp 20 | Ht 63.0 in | Wt 153.0 lb

## 2021-09-13 DIAGNOSIS — I70219 Atherosclerosis of native arteries of extremities with intermittent claudication, unspecified extremity: Secondary | ICD-10-CM | POA: Diagnosis not present

## 2021-09-13 NOTE — H&P (View-Only) (Signed)
Patient ID: Meredith Young, female   DOB: Nov 18, 1945, 76 y.o.   MRN: [id number]  Reason for Consult: Follow-up   Referred by Nicoletta Dress, MD  Subjective:     HPI:  Meredith Young is a 76 y.o. female with history of right carotid endarterectomy for symptomatic disease.  She has been followed for this.  More recently she has complained of very short distance claudication on the left greater than right and rest pain in her legs at night.  She does not have any tissue loss or ulceration.  No recent stroke, TIA or amaurosis.  Known aortic stenosis followed by Dr. Bettina Gavia.  She follows up today with bilateral lower extremity duplex and ABIs.  She takes aspirin she cannot tolerate statins I will get pharmacy to evaluate her for PCSK9 inhibitor when she is in the hospital.  Past Medical History:  Diagnosis Date   Ankle fracture, left, closed, initial encounter 02/02/2014   Anxiety 04/28/2014   Aortic stenosis 02/24/2015   Overview:  Very mild Nov 2015   Carotid artery occlusion    Carotid stenosis, bilateral 02/27/2016   Overview:  R ICA > 70%L ICA 50-69%   Cerebrovascular disease 04/28/2014   Chronic right shoulder pain 12/30/735   Complication of anesthesia    Depression 04/28/2014   Diabetes mellitus type II, controlled ([place]) 04/28/2014   Diabetes mellitus without complication ([place])    Diverticulitis of colon    Essential hypertension 04/28/2014   GERD (gastroesophageal reflux disease)    Gout    Gout    Headache    History of colonic polyps 04/28/2014   History of hiatal hernia    Hx of colonic polyps    Hyperlipidemia    Hypertension    HEART BEATS FAST    Hypothyroidism    Idiopathic peripheral neuropathy    Ingrown nail of great toe of left foot 02/11/2017   Ingrown nail of great toe of right foot 01/04/2017   Low back pain 04/28/2014   OA (osteoarthritis) 04/28/2014   Occlusion and stenosis of carotid artery without mention of cerebral infarction 11/25/2012    Osteoarthritis    Palpitation 01/27/2016   Paronychia of great toe, left 02/11/2017   Paronychia of great toe, right 01/04/2017   Pernicious anemia 04/28/2014   PONV (postoperative nausea and vomiting)    RLS (restless legs syndrome) 04/28/2014   Rosacea 04/28/2014   S/P rotator cuff repair 09/17/2016   TIA (transient ischemic attack)    01/2016   Family History  Problem Relation Age of Onset   Hypertension Mother    Heart disease Mother    COPD Mother    Stroke Father    Diabetes Father    Heart disease Father        before age 60   Hyperlipidemia Father    Hypertension Father    Other Father        amputation of great toe   Diabetes Sister    Colon cancer Neg Hx    Pancreatic cancer Neg Hx    Stomach cancer Neg Hx    Esophageal cancer Neg Hx    Liver disease Neg Hx    Past Surgical History:  Procedure Laterality Date   ABDOMINAL HYSTERECTOMY  1977   ANKLE SURGERY     LEFT   APPENDECTOMY  1967   CHOLECYSTECTOMY  1991   COLONOSCOPY  06/04/2011   Colonic polyps status post polypectomy. MOderate redominantly sigmoid diverticuolosis. Small internal hemorrhoids.  ENDARTERECTOMY Right 02/27/2016   Procedure: RIGHT ENDARTERECTOMY CAROTID;  Surgeon: Waynetta Sandy, MD;  Location: [facility] OR;  Service: Vascular;  Laterality: Right;   ESOPHAGOGASTRODUODENOSCOPY  01/21/2008   Erosive gastritis. Otherwise normal EGD   LEFT HEART CATH AND CORONARY ANGIOGRAPHY N/A 01/03/2021   Procedure: LEFT HEART CATH AND CORONARY ANGIOGRAPHY;  Surgeon: Leonie Man, MD;  Location: [place] [facility];  Service: Cardiovascular;  Laterality: N/A;   NASAL SINUS SURGERY  1975   PATCH ANGIOPLASTY Right 02/27/2016   Procedure: PATCH ANGIOPLASTY USING [employer] BIOLOGIC PATCH;  Surgeon: Waynetta Sandy, MD;  Location: [facility] OR;  Service: Vascular;  Laterality: Right;   POLYPECTOMY  1985   TOOTH EXTRACTION      Short Social History:  Social History   Tobacco Use   Smoking status: Former     Types: Cigarettes    Start date: 07/16/1988   Smokeless tobacco: Never  Substance Use Topics   Alcohol use: No    Allergies  Allergen Reactions   Statins Other (See Comments)    Leg aches, Myalgias Leg aches, Myalgias    Codeine Nausea And Vomiting   Demerol [Meperidine] Nausea And Vomiting and Other (See Comments)    Violent behavior/reaction   Diltiazem Other (See Comments)    UNSPECIFIED , Unknown   Metformin Other (See Comments)    UNSPECIFIED   Metformin And Related Other (See Comments)    UNSPECIFIED     Current Outpatient Medications  Medication Sig Dispense Refill   acetaminophen (TYLENOL) 500 MG tablet Take 500 mg by mouth every 6 (six) hours as needed for mild pain or moderate pain.     allopurinol (ZYLOPRIM) 300 MG tablet Take 300 mg by mouth daily in the afternoon. midday     amLODipine (NORVASC) 5 MG tablet Take 5 mg by mouth daily in the afternoon. midday  0   aspirin EC 81 MG tablet Take 81 mg by mouth in the morning and at bedtime. (Midday & bedtime)     Dulaglutide (TRULICITY) 1.5 EX/9.3ZJ SOPN Inject 1.5 mg into the skin every Thursday.     fexofenadine (ALLEGRA) 180 MG tablet Take 180 mg by mouth daily as needed for allergies or rhinitis.     folic acid (FOLVITE) 1 MG tablet Take 1 mg by mouth 2 (two) times a week.     glimepiride (AMARYL) 1 MG tablet Take 1 mg by mouth daily as needed (blood sugar greater 150.).     levothyroxine (SYNTHROID) 88 MCG tablet Take 88 mcg by mouth daily before breakfast.     LORazepam (ATIVAN) 0.5 MG tablet Take 0.5 mg by mouth 2 (two) times daily as needed (panic attacks).     losartan (COZAAR) 100 MG tablet Take 100 mg by mouth daily at 10 pm. (2200)  3   metoprolol tartrate (LOPRESSOR) 25 MG tablet Take 12.5 mg by mouth 2 (two) times daily.     Multiple Vitamins-Minerals (MULTIVITAMIN PO) Take 1 tablet by mouth daily.      omeprazole (PRILOSEC) 20 MG capsule Take 20 mg by mouth daily in the afternoon. midday      pramipexole (MIRAPEX) 0.5 MG tablet Take 0.5 mg by mouth at bedtime.     nitroGLYCERIN (NITROSTAT) 0.4 MG SL tablet Place 1 tablet (0.4 mg total) under the tongue every 5 (five) minutes as needed. 30 tablet 3   No current facility-administered medications for this visit.    Review of Systems  Constitutional:  Constitutional negative. HENT: HENT  negative.  Eyes: Eyes negative.  Respiratory: Respiratory negative.  Cardiovascular: Positive for claudication.  GI: Gastrointestinal negative.  Musculoskeletal: Positive for leg pain.  Skin: Skin negative.  Neurological: Neurological negative. Hematologic: Hematologic/lymphatic negative.  Psychiatric: Psychiatric negative.       Objective:  Objective  Vitals:   09/13/21 0855  BP: 137/73  Pulse: 87  Resp: 20  Temp: 98.4 F (36.9 C)  SpO2: 96%     Physical Exam HENT:     Head: Normocephalic.     Nose:     Comments: Wearing a mask Eyes:     Pupils: Pupils are equal, round, and reactive to light.  Neck:     Comments: Well-healed right neck incision Cardiovascular:     Pulses:          Femoral pulses are 0 on the right side and 0 on the left side. Pulmonary:     Effort: Pulmonary effort is normal.  Abdominal:     General: Abdomen is flat.     Palpations: Abdomen is soft.  Musculoskeletal:        General: Normal range of motion.     Cervical back: Normal range of motion and neck supple.     Right lower leg: No edema.     Left lower leg: No edema.  Skin:    General: Skin is warm.     Capillary Refill: Capillary refill takes less than 2 seconds.  Neurological:     Mental Status: She is alert.    Data: ABI Findings:  +---------+------------------+-----+----------+--------+   Right     Rt Pressure (mmHg) Index Waveform   Comment    +---------+------------------+-----+----------+--------+   Brachial  165                                            +---------+------------------+-----+----------+--------+   PTA       117                 0.70  monophasic            +---------+------------------+-----+----------+--------+   DP        137                0.82  biphasic              +---------+------------------+-----+----------+--------+   Great Toe 70                 0.42                        +---------+------------------+-----+----------+--------+   +---------+------------------+-----+--------+-------+   Left      Lt Pressure (mmHg) Index Waveform Comment   +---------+------------------+-----+--------+-------+   Brachial  167                                         +---------+------------------+-----+--------+-------+   PTA       114                0.68  biphasic           +---------+------------------+-----+--------+-------+   DP        106                0.63  biphasic           +---------+------------------+-----+--------+-------+  Great Toe 80                 0.48                     +---------+------------------+-----+--------+-------+   +-------+-----------+-----------+------------+------------+   ABI/TBI Today's ABI Today's TBI Previous ABI Previous TBI   +-------+-----------+-----------+------------+------------+   Right   0.82        0.42                                    +-------+-----------+-----------+------------+------------+   Left    0.68        0.48                                    +-------+-----------+-----------+------------+------------+  Study Result  LOWER EXTREMITY ARTERIAL DUPLEX STUDY   Patient Name:  Meredith Young  Date of Exam:   09/13/2021  Medical Rec #: [id number]         Accession #:    [id number]  Date of Birth: August 28, 1945          Patient Gender: F  Patient Age:   47 years  Exam Location:  [facility]  Procedure:      VAS [place] LOWER EXTREMITY ARTERIAL DUPLEX  Referring Phys: Servando Snare    ---------------------------------------------------------------------------  -----     Indications: Peripheral artery disease, and Rest pain and poor  pedal  pulses.   High Risk Factors: Hypertension.   Other Factors: TIA.   Vascular Interventions: Right CEA.  Current ABI:            R=0.82, L=0.68   Performing Technologist: Ronal Fear RVS, RCS      Examination Guidelines: A complete evaluation includes B-mode imaging,  spectral  Doppler, color Doppler, and power Doppler as needed of all accessible  portions  of each vessel. Bilateral testing is considered an integral part of a  complete  examination. Limited examinations for reoccurring indications may be  performed  as noted.        +----------+--------+-----+---------------+----------+--------+   RIGHT      PSV cm/s Ratio Stenosis        Waveform   Comments   +----------+--------+-----+---------------+----------+--------+   CFA Distal 110                            biphasic              +----------+--------+-----+---------------+----------+--------+   DFA        135                            biphasic              +----------+--------+-----+---------------+----------+--------+   SFA Prox   251            50-74% stenosis biphasic   origin     +----------+--------+-----+---------------+----------+--------+   SFA Mid    102                            monophasic            +----------+--------+-----+---------------+----------+--------+   SFA Distal 250  50-74% stenosis monophasic            +----------+--------+-----+---------------+----------+--------+   POP Prox   148                            monophasic            +----------+--------+-----+---------------+----------+--------+   POP Distal 87                             monophasic            +----------+--------+-----+---------------+----------+--------+   ATA Distal 48                             monophasic            +----------+--------+-----+---------------+----------+--------+   PTA Distal 39                             monophasic             +----------+--------+-----+---------------+----------+--------+   A focal velocity elevation of 251 cm/s was obtained at with a VR of 2.1. A  2nd focal velocity elevation was visualized, measuring 250 cm/s at with a  VR of 3.3.       +----------+--------+-----+---------------+----------+---------------------  ----+   LEFT       PSV cm/s Ratio Stenosis        Waveform   Comments                     +----------+--------+-----+---------------+----------+---------------------  ----+   CFA Distal 145                            biphasic                                +----------+--------+-----+---------------+----------+---------------------  ----+   DFA        331            50-74% stenosis monophasic                              +----------+--------+-----+---------------+----------+---------------------  ----+   SFA Prox   155                            biphasic                                +----------+--------+-----+---------------+----------+---------------------  ----+   SFA Mid    209            50-74% stenosis biphasic                                +----------+--------+-----+---------------+----------+---------------------  ----+   SFA Distal 85                             monophasic appears occluded at  the  adductor canal               +----------+--------+-----+---------------+----------+---------------------  ----+   POP Prox   62                             monophasic                              +----------+--------+-----+---------------+----------+---------------------  ----+   POP Distal 43                             monophasic                              +----------+--------+-----+---------------+----------+---------------------  ----+   ATA Distal 26                             monophasic                              +----------+--------+-----+---------------+----------+---------------------   ----+   PTA Mid    0                                                                      +----------+--------+-----+---------------+----------+---------------------  ----+   PTA Distal 0                                                                      +----------+--------+-----+---------------+----------+---------------------  ----+   A focal velocity elevation of 209 cm/s was obtained at with a VR of 2.0.  Findings are characteristic of 50-74% stenosis.     Summary:  Right: 50-74% stenosis at the origin of the superficial femoral artery.  50-74% stenosis at the distal superficial femoral artery.   Left: 50-74% stenosis of the profunda femoral artery.  50-74% stenosis in the mid superficial femoral artery with likely short  segment occlusion in the adductor canal.          Assessment/Plan:    76 year old female previous right carotid endarterectomy.  Now has multifactorial bilateral lower extremity pain certainly has occlusive disease to suggest underlying etiology for her claudication particularly on the left.  I cannot readily feel femoral pulses but this may be to habitus and positioning she does have flow into the common femoral arteries.  We will begin with right common femoral artery approach aortogram followed by lower extremity runoff and possible intervention of the left lower extremity.  We will evaluate the right lower extremity at the same time.  She will need to be on Plavix if we do place balloons or stents and I will need to get pharmacy to evaluate for financial assistance with PCSK9 inhibitors given that she cannot take statins.  We discussed the risk benefits  alternatives including no intervention at this time and she demonstrates good understanding and wishes to proceed.     Waynetta Sandy MD [facility]

## 2021-09-13 NOTE — Progress Notes (Signed)
Patient ID: Meredith Young, female   DOB: 12-14-1945, 76 y.o.   MRN: [id number]  Reason for Consult: Follow-up   Referred by Nicoletta Dress, MD  Subjective:     HPI:  Meredith Young is a 76 y.o. female with history of right carotid endarterectomy for symptomatic disease.  She has been followed for this.  More recently she has complained of very short distance claudication on the left greater than right and rest pain in her legs at night.  She does not have any tissue loss or ulceration.  No recent stroke, TIA or amaurosis.  Known aortic stenosis followed by Dr. Bettina Gavia.  She follows up today with bilateral lower extremity duplex and ABIs.  She takes aspirin she cannot tolerate statins I will get pharmacy to evaluate her for PCSK9 inhibitor when she is in the hospital.  Past Medical History:  Diagnosis Date   Ankle fracture, left, closed, initial encounter 02/02/2014   Anxiety 04/28/2014   Aortic stenosis 02/24/2015   Overview:  Very mild Nov 2015   Carotid artery occlusion    Carotid stenosis, bilateral 02/27/2016   Overview:  R ICA > 70%L ICA 50-69%   Cerebrovascular disease 04/28/2014   Chronic right shoulder pain 9/32/6712   Complication of anesthesia    Depression 04/28/2014   Diabetes mellitus type II, controlled ([place]) 04/28/2014   Diabetes mellitus without complication ([place])    Diverticulitis of colon    Essential hypertension 04/28/2014   GERD (gastroesophageal reflux disease)    Gout    Gout    Headache    History of colonic polyps 04/28/2014   History of hiatal hernia    Hx of colonic polyps    Hyperlipidemia    Hypertension    HEART BEATS FAST    Hypothyroidism    Idiopathic peripheral neuropathy    Ingrown nail of great toe of left foot 02/11/2017   Ingrown nail of great toe of right foot 01/04/2017   Low back pain 04/28/2014   OA (osteoarthritis) 04/28/2014   Occlusion and stenosis of carotid artery without mention of cerebral infarction 11/25/2012    Osteoarthritis    Palpitation 01/27/2016   Paronychia of great toe, left 02/11/2017   Paronychia of great toe, right 01/04/2017   Pernicious anemia 04/28/2014   PONV (postoperative nausea and vomiting)    RLS (restless legs syndrome) 04/28/2014   Rosacea 04/28/2014   S/P rotator cuff repair 09/17/2016   TIA (transient ischemic attack)    01/2016   Family History  Problem Relation Age of Onset   Hypertension Mother    Heart disease Mother    COPD Mother    Stroke Father    Diabetes Father    Heart disease Father        before age 42   Hyperlipidemia Father    Hypertension Father    Other Father        amputation of great toe   Diabetes Sister    Colon cancer Neg Hx    Pancreatic cancer Neg Hx    Stomach cancer Neg Hx    Esophageal cancer Neg Hx    Liver disease Neg Hx    Past Surgical History:  Procedure Laterality Date   ABDOMINAL HYSTERECTOMY  1977   ANKLE SURGERY     LEFT   APPENDECTOMY  1967   CHOLECYSTECTOMY  1991   COLONOSCOPY  06/04/2011   Colonic polyps status post polypectomy. MOderate redominantly sigmoid diverticuolosis. Small internal hemorrhoids.  ENDARTERECTOMY Right 02/27/2016   Procedure: RIGHT ENDARTERECTOMY CAROTID;  Surgeon: Waynetta Sandy, MD;  Location: [facility] OR;  Service: Vascular;  Laterality: Right;   ESOPHAGOGASTRODUODENOSCOPY  01/21/2008   Erosive gastritis. Otherwise normal EGD   LEFT HEART CATH AND CORONARY ANGIOGRAPHY N/A 01/03/2021   Procedure: LEFT HEART CATH AND CORONARY ANGIOGRAPHY;  Surgeon: Leonie Man, MD;  Location: [place] [facility];  Service: Cardiovascular;  Laterality: N/A;   NASAL SINUS SURGERY  1975   PATCH ANGIOPLASTY Right 02/27/2016   Procedure: PATCH ANGIOPLASTY USING Rueben Bash BIOLOGIC PATCH;  Surgeon: Waynetta Sandy, MD;  Location: [facility] OR;  Service: Vascular;  Laterality: Right;   POLYPECTOMY  1985   TOOTH EXTRACTION      Short Social History:  Social History   Tobacco Use   Smoking status: Former     Types: Cigarettes    Start date: 07/16/1988   Smokeless tobacco: Never  Substance Use Topics   Alcohol use: No    Allergies  Allergen Reactions   Statins Other (See Comments)    Leg aches, Myalgias Leg aches, Myalgias    Codeine Nausea And Vomiting   Demerol [Meperidine] Nausea And Vomiting and Other (See Comments)    Violent behavior/reaction   Diltiazem Other (See Comments)    UNSPECIFIED , Unknown   Metformin Other (See Comments)    UNSPECIFIED   Metformin And Related Other (See Comments)    UNSPECIFIED     Current Outpatient Medications  Medication Sig Dispense Refill   acetaminophen (TYLENOL) 500 MG tablet Take 500 mg by mouth every 6 (six) hours as needed for mild pain or moderate pain.     allopurinol (ZYLOPRIM) 300 MG tablet Take 300 mg by mouth daily in the afternoon. midday     amLODipine (NORVASC) 5 MG tablet Take 5 mg by mouth daily in the afternoon. midday  0   aspirin EC 81 MG tablet Take 81 mg by mouth in the morning and at bedtime. (Midday & bedtime)     Dulaglutide (TRULICITY) 1.5 XA/1.2IN SOPN Inject 1.5 mg into the skin every Thursday.     fexofenadine (ALLEGRA) 180 MG tablet Take 180 mg by mouth daily as needed for allergies or rhinitis.     folic acid (FOLVITE) 1 MG tablet Take 1 mg by mouth 2 (two) times a week.     glimepiride (AMARYL) 1 MG tablet Take 1 mg by mouth daily as needed (blood sugar greater 150.).     levothyroxine (SYNTHROID) 88 MCG tablet Take 88 mcg by mouth daily before breakfast.     LORazepam (ATIVAN) 0.5 MG tablet Take 0.5 mg by mouth 2 (two) times daily as needed (panic attacks).     losartan (COZAAR) 100 MG tablet Take 100 mg by mouth daily at 10 pm. (2200)  3   metoprolol tartrate (LOPRESSOR) 25 MG tablet Take 12.5 mg by mouth 2 (two) times daily.     Multiple Vitamins-Minerals (MULTIVITAMIN PO) Take 1 tablet by mouth daily.      omeprazole (PRILOSEC) 20 MG capsule Take 20 mg by mouth daily in the afternoon. midday      pramipexole (MIRAPEX) 0.5 MG tablet Take 0.5 mg by mouth at bedtime.     nitroGLYCERIN (NITROSTAT) 0.4 MG SL tablet Place 1 tablet (0.4 mg total) under the tongue every 5 (five) minutes as needed. 30 tablet 3   No current facility-administered medications for this visit.    Review of Systems  Constitutional:  Constitutional negative. HENT: HENT  negative.  Eyes: Eyes negative.  Respiratory: Respiratory negative.  Cardiovascular: Positive for claudication.  GI: Gastrointestinal negative.  Musculoskeletal: Positive for leg pain.  Skin: Skin negative.  Neurological: Neurological negative. Hematologic: Hematologic/lymphatic negative.  Psychiatric: Psychiatric negative.       Objective:  Objective  Vitals:   09/13/21 0855  BP: 137/73  Pulse: 87  Resp: 20  Temp: 98.4 F (36.9 C)  SpO2: 96%     Physical Exam HENT:     Head: Normocephalic.     Nose:     Comments: Wearing a mask Eyes:     Pupils: Pupils are equal, round, and reactive to light.  Neck:     Comments: Well-healed right neck incision Cardiovascular:     Pulses:          Femoral pulses are 0 on the right side and 0 on the left side. Pulmonary:     Effort: Pulmonary effort is normal.  Abdominal:     General: Abdomen is flat.     Palpations: Abdomen is soft.  Musculoskeletal:        General: Normal range of motion.     Cervical back: Normal range of motion and neck supple.     Right lower leg: No edema.     Left lower leg: No edema.  Skin:    General: Skin is warm.     Capillary Refill: Capillary refill takes less than 2 seconds.  Neurological:     Mental Status: She is alert.    Data: ABI Findings:  +---------+------------------+-----+----------+--------+   Right     Rt Pressure (mmHg) Index Waveform   Comment    +---------+------------------+-----+----------+--------+   Brachial  165                                            +---------+------------------+-----+----------+--------+   PTA       117                 0.70  monophasic            +---------+------------------+-----+----------+--------+   DP        137                0.82  biphasic              +---------+------------------+-----+----------+--------+   Great Toe 70                 0.42                        +---------+------------------+-----+----------+--------+   +---------+------------------+-----+--------+-------+   Left      Lt Pressure (mmHg) Index Waveform Comment   +---------+------------------+-----+--------+-------+   Brachial  167                                         +---------+------------------+-----+--------+-------+   PTA       114                0.68  biphasic           +---------+------------------+-----+--------+-------+   DP        106                0.63  biphasic           +---------+------------------+-----+--------+-------+  Great Toe 80                 0.48                     +---------+------------------+-----+--------+-------+   +-------+-----------+-----------+------------+------------+   ABI/TBI Today's ABI Today's TBI Previous ABI Previous TBI   +-------+-----------+-----------+------------+------------+   Right   0.82        0.42                                    +-------+-----------+-----------+------------+------------+   Left    0.68        0.48                                    +-------+-----------+-----------+------------+------------+  Study Result  LOWER EXTREMITY ARTERIAL DUPLEX STUDY   Patient Name:  Meredith Young  Date of Exam:   09/13/2021  Medical Rec #: [id number]         Accession #:    [id number]  Date of Birth: 03/12/1946          Patient Gender: F  Patient Age:   9 years  Exam Location:  [facility]  Procedure:      VAS [place] LOWER EXTREMITY ARTERIAL DUPLEX  Referring Phys: Servando Snare    ---------------------------------------------------------------------------  -----     Indications: Peripheral artery disease, and Rest pain and poor  pedal  pulses.   High Risk Factors: Hypertension.   Other Factors: TIA.   Vascular Interventions: Right CEA.  Current ABI:            R=0.82, L=0.68   Performing Technologist: Ronal Fear RVS, RCS      Examination Guidelines: A complete evaluation includes B-mode imaging,  spectral  Doppler, color Doppler, and power Doppler as needed of all accessible  portions  of each vessel. Bilateral testing is considered an integral part of a  complete  examination. Limited examinations for reoccurring indications may be  performed  as noted.        +----------+--------+-----+---------------+----------+--------+   RIGHT      PSV cm/s Ratio Stenosis        Waveform   Comments   +----------+--------+-----+---------------+----------+--------+   CFA Distal 110                            biphasic              +----------+--------+-----+---------------+----------+--------+   DFA        135                            biphasic              +----------+--------+-----+---------------+----------+--------+   SFA Prox   251            50-74% stenosis biphasic   origin     +----------+--------+-----+---------------+----------+--------+   SFA Mid    102                            monophasic            +----------+--------+-----+---------------+----------+--------+   SFA Distal 250  50-74% stenosis monophasic            +----------+--------+-----+---------------+----------+--------+   POP Prox   148                            monophasic            +----------+--------+-----+---------------+----------+--------+   POP Distal 87                             monophasic            +----------+--------+-----+---------------+----------+--------+   ATA Distal 48                             monophasic            +----------+--------+-----+---------------+----------+--------+   PTA Distal 39                             monophasic             +----------+--------+-----+---------------+----------+--------+   A focal velocity elevation of 251 cm/s was obtained at with a VR of 2.1. A  2nd focal velocity elevation was visualized, measuring 250 cm/s at with a  VR of 3.3.       +----------+--------+-----+---------------+----------+---------------------  ----+   LEFT       PSV cm/s Ratio Stenosis        Waveform   Comments                     +----------+--------+-----+---------------+----------+---------------------  ----+   CFA Distal 145                            biphasic                                +----------+--------+-----+---------------+----------+---------------------  ----+   DFA        331            50-74% stenosis monophasic                              +----------+--------+-----+---------------+----------+---------------------  ----+   SFA Prox   155                            biphasic                                +----------+--------+-----+---------------+----------+---------------------  ----+   SFA Mid    209            50-74% stenosis biphasic                                +----------+--------+-----+---------------+----------+---------------------  ----+   SFA Distal 85                             monophasic appears occluded at  the  adductor canal               +----------+--------+-----+---------------+----------+---------------------  ----+   POP Prox   62                             monophasic                              +----------+--------+-----+---------------+----------+---------------------  ----+   POP Distal 43                             monophasic                              +----------+--------+-----+---------------+----------+---------------------  ----+   ATA Distal 26                             monophasic                              +----------+--------+-----+---------------+----------+---------------------   ----+   PTA Mid    0                                                                      +----------+--------+-----+---------------+----------+---------------------  ----+   PTA Distal 0                                                                      +----------+--------+-----+---------------+----------+---------------------  ----+   A focal velocity elevation of 209 cm/s was obtained at with a VR of 2.0.  Findings are characteristic of 50-74% stenosis.     Summary:  Right: 50-74% stenosis at the origin of the superficial femoral artery.  50-74% stenosis at the distal superficial femoral artery.   Left: 50-74% stenosis of the profunda femoral artery.  50-74% stenosis in the mid superficial femoral artery with likely short  segment occlusion in the adductor canal.          Assessment/Plan:    76 year old female previous right carotid endarterectomy.  Now has multifactorial bilateral lower extremity pain certainly has occlusive disease to suggest underlying etiology for her claudication particularly on the left.  I cannot readily feel femoral pulses but this may be to habitus and positioning she does have flow into the common femoral arteries.  We will begin with right common femoral artery approach aortogram followed by lower extremity runoff and possible intervention of the left lower extremity.  We will evaluate the right lower extremity at the same time.  She will need to be on Plavix if we do place balloons or stents and I will need to get pharmacy to evaluate for financial assistance with PCSK9 inhibitors given that she cannot take statins.  We discussed the risk benefits  alternatives including no intervention at this time and she demonstrates good understanding and wishes to proceed.     Waynetta Sandy MD [facility]

## 2021-09-18 DIAGNOSIS — H02834 Dermatochalasis of left upper eyelid: Secondary | ICD-10-CM | POA: Diagnosis not present

## 2021-09-18 DIAGNOSIS — H02831 Dermatochalasis of right upper eyelid: Secondary | ICD-10-CM | POA: Diagnosis not present

## 2021-09-25 ENCOUNTER — Encounter (HOSPITAL_COMMUNITY)
Admission: RE | Disposition: A | Discharge status: Home / Self Care | Payer: Self-pay | Source: Home / Self Care | Attending: Vascular Surgery

## 2021-09-25 ENCOUNTER — Other Ambulatory Visit: Payer: Self-pay

## 2021-09-25 ENCOUNTER — Ambulatory Visit (HOSPITAL_COMMUNITY)
Admission: RE | Admit: 2021-09-25 | Discharge: 2021-09-25 | Disposition: A | Discharge status: Home / Self Care | Payer: Medicare Other | Attending: Vascular Surgery | Admitting: Vascular Surgery

## 2021-09-25 ENCOUNTER — Encounter (HOSPITAL_COMMUNITY): Payer: Self-pay | Admitting: Vascular Surgery

## 2021-09-25 DIAGNOSIS — E039 Hypothyroidism, unspecified: Secondary | ICD-10-CM | POA: Diagnosis not present

## 2021-09-25 DIAGNOSIS — K219 Gastro-esophageal reflux disease without esophagitis: Secondary | ICD-10-CM | POA: Diagnosis not present

## 2021-09-25 DIAGNOSIS — Z7982 Long term (current) use of aspirin: Secondary | ICD-10-CM | POA: Insufficient documentation

## 2021-09-25 DIAGNOSIS — E1151 Type 2 diabetes mellitus with diabetic peripheral angiopathy without gangrene: Secondary | ICD-10-CM | POA: Insufficient documentation

## 2021-09-25 DIAGNOSIS — Z79890 Hormone replacement therapy (postmenopausal): Secondary | ICD-10-CM | POA: Insufficient documentation

## 2021-09-25 DIAGNOSIS — Z79899 Other long term (current) drug therapy: Secondary | ICD-10-CM | POA: Insufficient documentation

## 2021-09-25 DIAGNOSIS — I70223 Atherosclerosis of native arteries of extremities with rest pain, bilateral legs: Secondary | ICD-10-CM | POA: Insufficient documentation

## 2021-09-25 DIAGNOSIS — Z7984 Long term (current) use of oral hypoglycemic drugs: Secondary | ICD-10-CM | POA: Diagnosis not present

## 2021-09-25 DIAGNOSIS — Z87891 Personal history of nicotine dependence: Secondary | ICD-10-CM | POA: Diagnosis not present

## 2021-09-25 DIAGNOSIS — Z8673 Personal history of transient ischemic attack (TIA), and cerebral infarction without residual deficits: Secondary | ICD-10-CM | POA: Diagnosis not present

## 2021-09-25 DIAGNOSIS — I6523 Occlusion and stenosis of bilateral carotid arteries: Secondary | ICD-10-CM | POA: Diagnosis not present

## 2021-09-25 DIAGNOSIS — I700 Atherosclerosis of aorta: Secondary | ICD-10-CM | POA: Insufficient documentation

## 2021-09-25 DIAGNOSIS — M109 Gout, unspecified: Secondary | ICD-10-CM | POA: Diagnosis not present

## 2021-09-25 DIAGNOSIS — E785 Hyperlipidemia, unspecified: Secondary | ICD-10-CM | POA: Insufficient documentation

## 2021-09-25 DIAGNOSIS — I10 Essential (primary) hypertension: Secondary | ICD-10-CM | POA: Insufficient documentation

## 2021-09-25 DIAGNOSIS — Z7985 Long-term (current) use of injectable non-insulin antidiabetic drugs: Secondary | ICD-10-CM | POA: Insufficient documentation

## 2021-09-25 HISTORY — PX: PERIPHERAL VASCULAR INTERVENTION: CATH118257

## 2021-09-25 HISTORY — PX: ABDOMINAL AORTOGRAM W/LOWER EXTREMITY: CATH118223

## 2021-09-25 LAB — GLUCOSE, CAPILLARY
Glucose-Capillary: 136 mg/dL — ABNORMAL HIGH (ref 70–99)
Glucose-Capillary: 71 mg/dL (ref 70–99)
Glucose-Capillary: 75 mg/dL (ref 70–99)

## 2021-09-25 LAB — POCT I-STAT, CHEM 8
BUN: 49 mg/dL — ABNORMAL HIGH (ref 8–23)
Calcium, Ion: 1.31 mmol/L (ref 1.15–1.40)
Chloride: 105 mmol/L (ref 98–111)
Creatinine, Ser: 1.4 mg/dL — ABNORMAL HIGH (ref 0.44–1.00)
Glucose, Bld: 61 mg/dL — ABNORMAL LOW (ref 70–99)
HCT: 37 % (ref 36.0–46.0)
Hemoglobin: 12.6 g/dL (ref 12.0–15.0)
Potassium: 4.2 mmol/L (ref 3.5–5.1)
Sodium: 136 mmol/L (ref 135–145)
TCO2: 21 mmol/L — ABNORMAL LOW (ref 22–32)

## 2021-09-25 LAB — POCT ACTIVATED CLOTTING TIME
Activated Clotting Time: 197 seconds
Activated Clotting Time: 167 seconds

## 2021-09-25 SURGERY — ABDOMINAL AORTOGRAM W/LOWER EXTREMITY
Anesthesia: LOCAL | Laterality: Bilateral

## 2021-09-25 MED ORDER — LIDOCAINE HCL (PF) 1 % IJ SOLN
INTRAMUSCULAR | Status: AC
  Filled 2021-09-25: qty 30

## 2021-09-25 MED ORDER — CLOPIDOGREL BISULFATE 300 MG PO TABS
ORAL_TABLET | ORAL | Status: AC
  Filled 2021-09-25: qty 1

## 2021-09-25 MED ORDER — SODIUM CHLORIDE 0.9 % IV SOLN: 250.0000 mL | INTRAVENOUS | Status: DC | PRN

## 2021-09-25 MED ORDER — FENTANYL CITRATE (PF) 100 MCG/2ML IJ SOLN
INTRAMUSCULAR | Status: DC | PRN
  Administered 2021-09-25: 50 ug via INTRAVENOUS

## 2021-09-25 MED ORDER — OXYCODONE HCL 5 MG PO TABS: 5.0000 mg | ORAL_TABLET | ORAL | Status: DC | PRN

## 2021-09-25 MED ORDER — HEPARIN (PORCINE) IN NACL 1000-0.9 UT/500ML-% IV SOLN
INTRAVENOUS | Status: DC | PRN
  Administered 2021-09-25 (×2): 500 mL

## 2021-09-25 MED ORDER — HEPARIN (PORCINE) IN NACL 1000-0.9 UT/500ML-% IV SOLN
INTRAVENOUS | Status: AC
  Filled 2021-09-25: qty 500

## 2021-09-25 MED ORDER — MORPHINE SULFATE (PF) 2 MG/ML IV SOLN
2.0000 mg | INTRAVENOUS | Status: DC | PRN
  Administered 2021-09-25: 2 mg via INTRAVENOUS

## 2021-09-25 MED ORDER — CLOPIDOGREL BISULFATE 75 MG PO TABS: 75.0000 mg | ORAL_TABLET | Freq: Every day | ORAL | Status: DC

## 2021-09-25 MED ORDER — MIDAZOLAM HCL 2 MG/2ML IJ SOLN
INTRAMUSCULAR | Status: AC
  Filled 2021-09-25: qty 2

## 2021-09-25 MED ORDER — SODIUM CHLORIDE 0.9% FLUSH: 3.0000 mL | INTRAVENOUS | Status: DC | PRN

## 2021-09-25 MED ORDER — ONDANSETRON HCL 4 MG/2ML IJ SOLN
4.0000 mg | Freq: Four times a day (QID) | INTRAMUSCULAR | Status: DC | PRN
  Administered 2021-09-25: 4 mg via INTRAVENOUS

## 2021-09-25 MED ORDER — ONDANSETRON HCL 4 MG/2ML IJ SOLN
INTRAMUSCULAR | Status: AC
  Filled 2021-09-25: qty 2

## 2021-09-25 MED ORDER — DEXTROSE 50 % IV SOLN
12.5000 g | INTRAVENOUS | Status: AC
  Administered 2021-09-25: 12.5 g via INTRAVENOUS

## 2021-09-25 MED ORDER — HEPARIN SODIUM (PORCINE) 1000 UNIT/ML IJ SOLN
INTRAMUSCULAR | Status: AC
  Filled 2021-09-25: qty 10

## 2021-09-25 MED ORDER — SODIUM CHLORIDE 0.9 % IV SOLN: INTRAVENOUS | Status: AC

## 2021-09-25 MED ORDER — ACETAMINOPHEN 325 MG PO TABS: 650.0000 mg | ORAL_TABLET | ORAL | Status: DC | PRN

## 2021-09-25 MED ORDER — MIDAZOLAM HCL 2 MG/2ML IJ SOLN
INTRAMUSCULAR | Status: DC | PRN
  Administered 2021-09-25: 1 mg via INTRAVENOUS

## 2021-09-25 MED ORDER — SODIUM CHLORIDE 0.9 % IV SOLN: INTRAVENOUS | Status: DC

## 2021-09-25 MED ORDER — HYDRALAZINE HCL 20 MG/ML IJ SOLN: 5.0000 mg | INTRAMUSCULAR | Status: DC | PRN

## 2021-09-25 MED ORDER — CLOPIDOGREL BISULFATE 300 MG PO TABS
ORAL_TABLET | ORAL | Status: DC | PRN
  Administered 2021-09-25: 300 mg via ORAL

## 2021-09-25 MED ORDER — CLOPIDOGREL BISULFATE 75 MG PO TABS: 300.0000 mg | ORAL_TABLET | Freq: Once | ORAL | Status: DC

## 2021-09-25 MED ORDER — DEXTROSE 50 % IV SOLN
INTRAVENOUS | Status: AC
Start: 2021-09-25 — End: 2021-09-25
  Filled 2021-09-25: qty 50

## 2021-09-25 MED ORDER — CLOPIDOGREL BISULFATE 75 MG PO TABS: 75.0000 mg | ORAL_TABLET | Freq: Every day | ORAL | 11 refills | Status: AC

## 2021-09-25 MED ORDER — LABETALOL HCL 5 MG/ML IV SOLN
10.0000 mg | INTRAVENOUS | Status: DC | PRN
  Administered 2021-09-25: 10 mg via INTRAVENOUS

## 2021-09-25 MED ORDER — MORPHINE SULFATE (PF) 2 MG/ML IV SOLN
INTRAVENOUS | Status: AC
  Filled 2021-09-25: qty 1

## 2021-09-25 MED ORDER — SODIUM CHLORIDE 0.9% FLUSH: 3.0000 mL | Freq: Two times a day (BID) | INTRAVENOUS | Status: DC

## 2021-09-25 MED ORDER — IODIXANOL 320 MG/ML IV SOLN
INTRAVENOUS | Status: DC | PRN
  Administered 2021-09-25: 150 mL

## 2021-09-25 MED ORDER — LIDOCAINE HCL (PF) 1 % IJ SOLN
INTRAMUSCULAR | Status: DC | PRN
  Administered 2021-09-25 (×2): 12 mL

## 2021-09-25 MED ORDER — FENTANYL CITRATE (PF) 100 MCG/2ML IJ SOLN
INTRAMUSCULAR | Status: AC
  Filled 2021-09-25: qty 2

## 2021-09-25 MED ORDER — LABETALOL HCL 5 MG/ML IV SOLN
INTRAVENOUS | Status: AC
  Filled 2021-09-25: qty 4

## 2021-09-25 MED ORDER — HEPARIN SODIUM (PORCINE) 1000 UNIT/ML IJ SOLN
INTRAMUSCULAR | Status: DC | PRN
  Administered 2021-09-25: 5000 [IU] via INTRAVENOUS

## 2021-09-25 SURGICAL SUPPLY — 18 items
CATH ANGIO 5F BER2 65CM (CATHETERS) ×2 IMPLANT
CATH NAVICROSS ANG 65CM (CATHETERS) IMPLANT
CATH OMNI FLUSH 5F 65CM (CATHETERS) ×2 IMPLANT
CATHETER NAVICROSS ANG 65CM (CATHETERS) ×3
GLIDEWIRE ADV .035X260CM (WIRE) ×2 IMPLANT
KIT ENCORE 26 ADVANTAGE (KITS) ×4 IMPLANT
KIT MICROPUNCTURE NIT STIFF (SHEATH) ×2 IMPLANT
KIT PV (KITS) ×3 IMPLANT
SHEATH BRITE TIP 7FR 35CM (SHEATH) ×4 IMPLANT
SHEATH PINNACLE 5F 10CM (SHEATH) ×2 IMPLANT
SHEATH PROBE COVER 6X72 (BAG) ×2 IMPLANT
STENT VIABAHN 7X29X80 VBX (Permanent Stent) ×2 IMPLANT
STENT VIABAHN 7X39X80 VBX (Permanent Stent) ×2 IMPLANT
SYR MEDRAD MARK V 150ML (SYRINGE) ×2 IMPLANT
TRANSDUCER W/STOPCOCK (MISCELLANEOUS) ×3 IMPLANT
TRAY PV CATH (CUSTOM PROCEDURE TRAY) ×3 IMPLANT
WIRE BENTSON .035X145CM (WIRE) ×2 IMPLANT
WIRE ROSEN-J .035X180CM (WIRE) ×2 IMPLANT

## 2021-09-25 NOTE — Progress Notes (Signed)
SITE AREA: right groin/femoral ? ?SITE PRIOR TO REMOVAL:  LEVEL 0 ? ?PRESSURE APPLIED FOR: approximately 20 minutes ? ?MANUAL: yes ? ?PATIENT STATUS DURING PULL: stable ? ?POST PULL SITE:  LEVEL 0 ? ?POST PULL INSTRUCTIONS GIVEN: yes ? ?POST PULL PULSES PRESENT: right pedal pulse dopplerable ? ?DRESSING APPLIED: gauze with tegaderm ? ?BEDREST BEGINS .... see next note, 2nd sheath removal ? ?COMMENTS:  bruising noted around insertion site ?

## 2021-09-25 NOTE — Interval H&P Note (Signed)
History and Physical Interval Note: ? ?09/25/2021 ?7:14 AM ? ?Meredith Young  has presented today for surgery, with the diagnosis of Claudication.  The various methods of treatment have been discussed with the patient and family. After consideration of risks, benefits and other options for treatment, the patient has consented to  Procedure(s): ?ABDOMINAL AORTOGRAM W/LOWER EXTREMITY (N/A) as a surgical intervention.  The patient's history has been reviewed, patient examined, no change in status, stable for surgery.  I have reviewed the patient's chart and labs.  Questions were answered to the patient's satisfaction.   ? ? ?Servando Snare ? ? ?

## 2021-09-25 NOTE — Progress Notes (Signed)
SITE AREA: left groin/femoral ? ?SITE PRIOR TO REMOVAL:  LEVEL 0 ? ?PRESSURE APPLIED FOR: approximately 20 minutes ? ?MANUAL: yes ? ?PATIENT STATUS DURING PULL: stable ? ?POST PULL SITE:  LEVEL 0 ? ?POST PULL INSTRUCTIONS GIVEN: yes ? ?POST PULL PULSES PRESENT: left pedal pulse dopplerable ? ?DRESSING APPLIED: gauze with tegaderm ? ?BEDREST BEGINS @ 1102 ? ?COMMENTS:  ?

## 2021-09-25 NOTE — Op Note (Signed)
? ? ?Patient name: Meredith Young MRN: [id number] DOB: [birth year] Sex: female ? ?09/25/2021 ?Pre-operative Diagnosis: Bilateral lower extremity rest pain ?Post-operative diagnosis:  Same ?Surgeon:  Eda Paschal. Donzetta Matters, MD ?Procedure Performed: ?1.  Ultrasound-guided cannulation right common femoral artery ?2.  Aortogram ?3.  Selection of the left common femoral artery and left lower extremity angiogram ?4.  Right lower extremity angiogram ?5.  Ultrasound-guided cannulation left common femoral artery ?6.  Right common iliac artery stent with 7 x 39 mm VBX and left common iliac artery stent with 7 x 29 mm VBX ?7.  Moderate sedation with fentanyl and Versed for 48 minutes ? ? ?Indications: 76 year old female with history of carotid endarterectomy now with bilateral lower extremity rest pain left greater than right and depressed ABIs with notable SFA disease with previous duplexes and no femoral pulses palpable in the office.  She is now indicated for angiography with possible intervention. ? ?Findings: Her aorta was severely diseased.  Distally there was a napkin ring type stenosis in the right common neck artery had approximately 80% stenosis.  The left common iliac artery was also hazy but there was no discernible stenosis but there was a 20 mmHg gradient across the left common leg artery.  After stenting of the bilateral common iliac arteries with raising of the bifurcation there were no gradients across either stent with brisk flow from the aorta down to the bilateral common iliac arteries into the external neck arteries.  The left lower extremity which is her most symptomatic there is a severely diseased SFA which occludes at the above-knee popliteal and reconstitutes at the knee with two-vessel runoff via the anterior tibial and peroneal.  I did not intervene here given the small size of the SFA leading into an occlusion.  On the right side she has diminutive SFA popliteal arteries although there are no occlusions.   She has single-vessel runoff via the anterior tibial artery on the right. ? ?I will see outpatient recovers from bilateral common iliac artery stenting and then we will plan for possible left common femoral to popliteal artery bypass.  I will have her vein mapped on follow-up. ?  ?Procedure:  The patient was identified in the holding area and taken to room 8.  The patient was then placed supine on the table and prepped and draped in the usual sterile fashion.  A time out was called.  Ultrasound was used to evaluate first the right common femoral artery which was noted to be disease.  The area was anesthetized with 1% lidocaine cannulated micropuncture needle followed by wire and sheath.  An image saved the permanent record.  I placed a Bentson wire followed by 5 [place] sheath.  I could not get the Bentson wire to traverse the high-grade stenosis of the right common neck artery.  I was able to use a Berenstein catheter and Glidewire advantage and get into the aorta.  An Omni catheter was placed we performed aortogram.  Given her elevated creatinine I elected to cross the bifurcation.  This was somewhat difficult given the tight stenosis of the distal aorta.  I used the Omni catheter and Glidewire advantage I was able to get the wire down into the left SFA and then used a Nava cross catheter into the left common femoral artery and performed left lower extremity angiogram with the above findings I elected no intervention there.  I then did a pullback pressure across the left common iliac artery this demonstrated a 20 mmHg gradient.  I elected for stenting of the bilateral common iliac arteries with slight raising of the bifurcation.  Ultrasound was then used to identify the left common femoral artery.  Again this was disease.  The area again was anesthetized with 1% lidocaine and cannulated with a micropuncture needle followed by wire and sheath.  Again and images saved the permanent record.  I placed a Bentson wire  followed by a 5 [place] sheath.  I again used a Berenstein catheter to cross into the aorta using the Glidewire advantage and then placed a short Rosen wire.  I exchanged on both sides for long 35 mm 7 French sheaths and the patient was given 5000 units of heparin.  I then primarily stented the bilateral common iliac arteries simultaneously on the right side with a 7 x 39 on the left side with a 7 x 29 VBX.  I then performed pullback gradients on both sides was 0.  The wires were removed.  I then performed right lower extremity angiography which demonstrated no occlusions but there was stenoses of the SFA with runoff via the anterior tibial artery.  Hopefully the stenting of the right side common iliac artery will relieve her symptoms on the right she will possibly need bypass on the left side and I will vein map her on follow-up.  She still be removed in postoperative holding.  300 mg of Plavix was administered.  She tolerated procedure without any complication ? ? ?Contrast: 150cc ? ?Ryota Treece C. Donzetta Matters, MD ?[facility] ?Office: [phone] ?Pager: [phone] ? ? ?

## 2021-09-25 NOTE — Progress Notes (Addendum)
Informed Dr Donzetta Matters regarding hematoma to right groin, will f/u with pt before DC home, see MAR and Flowsheets for VS, safety maintained, BR start time adjusted to start at 1125 ?

## 2021-09-27 ENCOUNTER — Other Ambulatory Visit: Payer: Self-pay

## 2021-09-27 ENCOUNTER — Telehealth: Payer: Self-pay

## 2021-09-27 DIAGNOSIS — I70219 Atherosclerosis of native arteries of extremities with intermittent claudication, unspecified extremity: Secondary | ICD-10-CM

## 2021-09-27 NOTE — Telephone Encounter (Signed)
Pt called to confirm what blood thinners she is to be on. Per MD, she is to stay on both ASA and Plavix for now. Pt was also scheduled for 4 week f/u with vein mapping. No further questions/concerns at this time. ?

## 2021-10-06 ENCOUNTER — Telehealth: Payer: Self-pay | Admitting: *Deleted

## 2021-10-06 NOTE — Telephone Encounter (Signed)
Patient is calling to ask if she should stop taking her blood thinner (Plavix) before upcoming appointment 10/17/21 for possible ingrown procedure. Please advise. ?

## 2021-10-06 NOTE — Telephone Encounter (Signed)
Patient has been given instructions to continue to take plavix

## 2021-10-16 DIAGNOSIS — I10 Essential (primary) hypertension: Secondary | ICD-10-CM | POA: Diagnosis not present

## 2021-10-16 DIAGNOSIS — M109 Gout, unspecified: Secondary | ICD-10-CM | POA: Diagnosis not present

## 2021-10-16 DIAGNOSIS — E039 Hypothyroidism, unspecified: Secondary | ICD-10-CM | POA: Diagnosis not present

## 2021-10-16 DIAGNOSIS — E1142 Type 2 diabetes mellitus with diabetic polyneuropathy: Secondary | ICD-10-CM | POA: Diagnosis not present

## 2021-10-16 DIAGNOSIS — Z8673 Personal history of transient ischemic attack (TIA), and cerebral infarction without residual deficits: Secondary | ICD-10-CM | POA: Diagnosis not present

## 2021-10-16 DIAGNOSIS — Z1231 Encounter for screening mammogram for malignant neoplasm of breast: Secondary | ICD-10-CM | POA: Diagnosis not present

## 2021-10-16 DIAGNOSIS — E785 Hyperlipidemia, unspecified: Secondary | ICD-10-CM | POA: Diagnosis not present

## 2021-10-17 ENCOUNTER — Ambulatory Visit (INDEPENDENT_AMBULATORY_CARE_PROVIDER_SITE_OTHER): Payer: No Typology Code available for payment source | Admitting: Podiatry

## 2021-10-17 DIAGNOSIS — E1100 Type 2 diabetes mellitus with hyperosmolarity without nonketotic hyperglycemic-hyperosmolar coma (NKHHC): Secondary | ICD-10-CM | POA: Diagnosis not present

## 2021-10-17 DIAGNOSIS — I739 Peripheral vascular disease, unspecified: Secondary | ICD-10-CM | POA: Diagnosis not present

## 2021-10-17 DIAGNOSIS — L600 Ingrowing nail: Secondary | ICD-10-CM | POA: Diagnosis not present

## 2021-10-17 NOTE — Progress Notes (Signed)
?  Subjective:  ?Patient ID: Meredith Young, female    DOB: 09/23/1945,   MRN: [id number] ? ?Chief Complaint  ?Patient presents with  ? Ingrown Toenail  ?  Left medial hallux x 2 months.   ? ? ?76 y.o. female presents for concern of bilateral ingrown toenails. Primarily the left medial hallux area. Relates it is very tender to touch. Has had pedicures to cut them out but return. She is diabetic and last A1c was 6.7. She also has a significant history of PAD with stents placed in the past.  . Denies any other pedal complaints. Denies n/v/f/c.  ? ?Past Medical History:  ?Diagnosis Date  ? Ankle fracture, left, closed, initial encounter 02/02/2014  ? Anxiety 04/28/2014  ? Aortic stenosis 02/24/2015  ? Overview:  Very mild Nov 2015  ? Carotid artery occlusion   ? Carotid stenosis, bilateral 02/27/2016  ? Overview:  R ICA > 70%L ICA 50-69%  ? Cerebrovascular disease 04/28/2014  ? Chronic right shoulder pain 07/26/2016  ? Complication of anesthesia   ? Depression 04/28/2014  ? Diabetes mellitus type II, controlled ([place]) 04/28/2014  ? Diabetes mellitus without complication ([place])   ? Diverticulitis of colon   ? Essential hypertension 04/28/2014  ? GERD (gastroesophageal reflux disease)   ? Gout   ? Gout   ? Headache   ? History of colonic polyps 04/28/2014  ? History of hiatal hernia   ? Hx of colonic polyps   ? Hyperlipidemia   ? Hypertension   ? HEART BEATS FAST   ? Hypothyroidism   ? Idiopathic peripheral neuropathy   ? Ingrown nail of great toe of left foot 02/11/2017  ? Ingrown nail of great toe of right foot 01/04/2017  ? Low back pain 04/28/2014  ? OA (osteoarthritis) 04/28/2014  ? Occlusion and stenosis of carotid artery without mention of cerebral infarction 11/25/2012  ? Osteoarthritis   ? Palpitation 01/27/2016  ? Paronychia of great toe, left 02/11/2017  ? Paronychia of great toe, right 01/04/2017  ? Pernicious anemia 04/28/2014  ? PONV (postoperative nausea and vomiting)   ? RLS (restless legs syndrome) 04/28/2014  ?  Rosacea 04/28/2014  ? S/P rotator cuff repair 09/17/2016  ? TIA (transient ischemic attack)   ? 01/2016  ? ? ?Objective:  ?Physical Exam: ?Vascular: DP/PT pulses 1/4 bilateral. CFT <3 seconds. Normal hair growth on digits. No edema.  ?Skin. No lacerations or abrasions bilateral feet. Hallux nails bilateral  bilateral borders incurvated. No erythema edema or purulence noted.  ?Musculoskeletal: MMT 5/5 bilateral lower extremities in DF, PF, Inversion and Eversion. Deceased ROM in DF of ankle joint.  ?Neurological: Sensation intact to light touch.  ? ?Assessment:  ? ?1. Ingrown toenail   ?2. Type 2 diabetes mellitus with hyperosmolarity without coma, without long-term current use of insulin ([place])   ?3. PAD (peripheral artery disease) ([place])   ? ? ? ?Plan:  ?Patient was evaluated and treated and all questions answered. ?Discussed ingrown nails and treatment options.  ?Discussed trimming back in slant back fashion. Will avoid procedure due to diabetic and PAD history.  ?Nails debrided in slant back fashion.  ?Discussed soaking.  ?Patient to return in 3 months for revalaution.  ? ?Lorenda Peck, DPM  ? ? ?

## 2021-10-17 NOTE — Patient Instructions (Signed)

## 2021-10-20 DIAGNOSIS — M25512 Pain in left shoulder: Secondary | ICD-10-CM | POA: Diagnosis not present

## 2021-10-20 DIAGNOSIS — S43422A Sprain of left rotator cuff capsule, initial encounter: Secondary | ICD-10-CM | POA: Diagnosis not present

## 2021-10-20 DIAGNOSIS — S46912A Strain of unspecified muscle, fascia and tendon at shoulder and upper arm level, left arm, initial encounter: Secondary | ICD-10-CM | POA: Diagnosis not present

## 2021-10-26 DIAGNOSIS — M19012 Primary osteoarthritis, left shoulder: Secondary | ICD-10-CM | POA: Diagnosis not present

## 2021-10-26 DIAGNOSIS — M89212 Other disorders of bone development and growth, left shoulder: Secondary | ICD-10-CM | POA: Diagnosis not present

## 2021-10-27 DIAGNOSIS — M109 Gout, unspecified: Secondary | ICD-10-CM | POA: Diagnosis not present

## 2021-11-01 ENCOUNTER — Encounter: Payer: Self-pay | Admitting: Vascular Surgery

## 2021-11-01 ENCOUNTER — Ambulatory Visit (HOSPITAL_COMMUNITY)
Admission: RE | Admit: 2021-11-01 | Discharge: 2021-11-01 | Disposition: A | Discharge status: Home / Self Care | Payer: PPO | Source: Ambulatory Visit | Attending: Vascular Surgery | Admitting: Vascular Surgery

## 2021-11-01 ENCOUNTER — Ambulatory Visit (INDEPENDENT_AMBULATORY_CARE_PROVIDER_SITE_OTHER): Payer: HMO | Admitting: Vascular Surgery

## 2021-11-01 VITALS — BP 124/65 | HR 85 | Temp 97.9°F | Resp 20 | Ht 63.0 in | Wt 155.0 lb

## 2021-11-01 DIAGNOSIS — I6523 Occlusion and stenosis of bilateral carotid arteries: Secondary | ICD-10-CM | POA: Diagnosis not present

## 2021-11-01 DIAGNOSIS — I70219 Atherosclerosis of native arteries of extremities with intermittent claudication, unspecified extremity: Secondary | ICD-10-CM | POA: Diagnosis not present

## 2021-11-01 NOTE — Progress Notes (Signed)
? ?Patient ID: Meredith Young, female   DOB: 1946-04-08, 76 y.o.   MRN: [id number] ? ?Reason for Consult: Follow-up ?  ?Referred by Nicoletta Dress, MD ? ?Subjective:  ?   ?HPI: ? ?Meredith Young is a 76 y.o. female with a history of right carotid endarterectomy for symptomatic disease.  More recently she is complained of very short distance claudication on the left greater than right.  At last office visit I could not appreciate common femoral pulses bilaterally and she was taken for angiography and underwent bilateral common iliac artery stenting.  Since that time she has had persistent pain in the right groin from hematoma although this is on the medial and mid thigh level and is resolving slowly.  Patient does have severe statin intolerance she does take aspirin daily.  She states that she is able to walk from the parking lot into the grocery store but she has difficulty after that and has to stop for cramping of the left greater than the right lower extremity.  She denies any tissue loss or ulceration.  She has no recent TIA or amaurosis or stroke. ? ?Past Medical History:  ?Diagnosis Date  ? Ankle fracture, left, closed, initial encounter 02/02/2014  ? Anxiety 04/28/2014  ? Aortic stenosis 02/24/2015  ? Overview:  Very mild Nov 2015  ? Carotid artery occlusion   ? Carotid stenosis, bilateral 02/27/2016  ? Overview:  R ICA > 70%L ICA 50-69%  ? Cerebrovascular disease 04/28/2014  ? Chronic right shoulder pain 07/26/2016  ? Complication of anesthesia   ? Depression 04/28/2014  ? Diabetes mellitus type II, controlled ([place]) 04/28/2014  ? Diabetes mellitus without complication ([place])   ? Diverticulitis of colon   ? Essential hypertension 04/28/2014  ? GERD (gastroesophageal reflux disease)   ? Gout   ? Gout   ? Headache   ? History of colonic polyps 04/28/2014  ? History of hiatal hernia   ? Hx of colonic polyps   ? Hyperlipidemia   ? Hypertension   ? HEART BEATS FAST   ? Hypothyroidism   ? Idiopathic peripheral  neuropathy   ? Ingrown nail of great toe of left foot 02/11/2017  ? Ingrown nail of great toe of right foot 01/04/2017  ? Low back pain 04/28/2014  ? OA (osteoarthritis) 04/28/2014  ? Occlusion and stenosis of carotid artery without mention of cerebral infarction 11/25/2012  ? Osteoarthritis   ? Palpitation 01/27/2016  ? Paronychia of great toe, left 02/11/2017  ? Paronychia of great toe, right 01/04/2017  ? Pernicious anemia 04/28/2014  ? PONV (postoperative nausea and vomiting)   ? RLS (restless legs syndrome) 04/28/2014  ? Rosacea 04/28/2014  ? S/P rotator cuff repair 09/17/2016  ? TIA (transient ischemic attack)   ? 01/2016  ? ?Family History  ?Problem Relation Age of Onset  ? Hypertension Mother   ? Heart disease Mother   ? COPD Mother   ? Stroke Father   ? Diabetes Father   ? Heart disease Father   ?     before age 57  ? Hyperlipidemia Father   ? Hypertension Father   ? Other Father   ?     amputation of great toe  ? Diabetes Sister   ? Colon cancer Neg Hx   ? Pancreatic cancer Neg Hx   ? Stomach cancer Neg Hx   ? Esophageal cancer Neg Hx   ? Liver disease Neg Hx   ? ?Past Surgical History:  ?  Procedure Laterality Date  ? ABDOMINAL AORTOGRAM W/LOWER EXTREMITY Bilateral 09/25/2021  ? Procedure: ABDOMINAL AORTOGRAM W/LOWER EXTREMITY;  Surgeon: Waynetta Sandy, MD;  Location: [facility];  Service: Cardiovascular;  Laterality: Bilateral;  ? ABDOMINAL HYSTERECTOMY  1977  ? ANKLE SURGERY    ? LEFT  ? APPENDECTOMY  1967  ? CHOLECYSTECTOMY  1991  ? COLONOSCOPY  06/04/2011  ? Colonic polyps status post polypectomy. MOderate redominantly sigmoid diverticuolosis. Small internal hemorrhoids.  ? ENDARTERECTOMY Right 02/27/2016  ? Procedure: RIGHT ENDARTERECTOMY CAROTID;  Surgeon: Waynetta Sandy, MD;  Location: [facility];  Service: Vascular;  Laterality: Right;  ? ESOPHAGOGASTRODUODENOSCOPY  01/21/2008  ? Erosive gastritis. Otherwise normal EGD  ? LEFT HEART CATH AND CORONARY ANGIOGRAPHY N/A 01/03/2021  ?  Procedure: LEFT HEART CATH AND CORONARY ANGIOGRAPHY;  Surgeon: Leonie Man, MD;  Location: [facility];  Service: Cardiovascular;  Laterality: N/A;  ? NASAL SINUS SURGERY  1975  ? PATCH ANGIOPLASTY Right 02/27/2016  ? Procedure: PATCH ANGIOPLASTY USING [employer] BIOLOGIC PATCH;  Surgeon: Waynetta Sandy, MD;  Location: [facility];  Service: Vascular;  Laterality: Right;  ? PERIPHERAL VASCULAR INTERVENTION Bilateral 09/25/2021  ? Procedure: PERIPHERAL VASCULAR INTERVENTION;  Surgeon: Waynetta Sandy, MD;  Location: [facility];  Service: Cardiovascular;  Laterality: Bilateral;  iliacs  ? POLYPECTOMY  1985  ? TOOTH EXTRACTION    ? ? ?Short Social History:  ?Social History  ? ?Tobacco Use  ? Smoking status: Former  ?  Types: Cigarettes  ?  Start date: 07/16/1988  ? Smokeless tobacco: Never  ?Substance Use Topics  ? Alcohol use: No  ? ? ?Allergies  ?Allergen Reactions  ? Statins Other (See Comments)  ?  Leg aches, Myalgias  ? Codeine Nausea And Vomiting  ? Demerol [Meperidine] Nausea And Vomiting and Other (See Comments)  ?  Violent behavior/reaction  ? Diltiazem Other (See Comments)  ?  Pt does not remember reaction   ? Metformin Other (See Comments)  ?  May blood sugar drop  ? ? ?Current Outpatient Medications  ?Medication Sig Dispense Refill  ? acetaminophen (TYLENOL) 500 MG tablet Take 500-1,000 mg by mouth every 6 (six) hours as needed for mild pain or moderate pain.    ? allopurinol (ZYLOPRIM) 300 MG tablet Take 300 mg by mouth daily in the afternoon. midday    ? amLODipine (NORVASC) 5 MG tablet Take 5 mg by mouth daily in the afternoon. midday  0  ? Apoaequorin (PREVAGEN) 10 MG CAPS Take 10 mg by mouth daily.    ? aspirin EC 81 MG tablet Take 81 mg by mouth at bedtime.    ? carboxymethylcellulose (REFRESH PLUS) 0.5 % SOLN Place 1 drop into both eyes 2 (two) times daily as needed (dry eyes).    ? clopidogrel (PLAVIX) 75 MG tablet Take 1 tablet (75 mg total) by mouth daily. 30 tablet 11   ? Dulaglutide (TRULICITY) 1.5 PJ/0.9TO SOPN Inject 1.5 mg into the skin every Saturday.    ? fexofenadine (ALLEGRA) 180 MG tablet Take 180 mg by mouth daily as needed for allergies or rhinitis.    ? folic acid (FOLVITE) 1 MG tablet Take 1 mg by mouth in the morning and at bedtime.    ? glimepiride (AMARYL) 1 MG tablet Take 1 mg by mouth daily as needed (blood sugar greater 150.).    ? levothyroxine (SYNTHROID) 88 MCG tablet Take 88 mcg by mouth daily before breakfast.    ?  LORazepam (ATIVAN) 0.5 MG tablet Take 0.5 mg by mouth 2 (two) times daily as needed (panic attacks).    ? losartan (COZAAR) 100 MG tablet Take 100 mg by mouth at bedtime.  3  ? metoprolol tartrate (LOPRESSOR) 25 MG tablet Take 12.5 mg by mouth 2 (two) times daily.    ? Multiple Vitamins-Minerals (MULTIVITAMIN PO) Take 1 tablet by mouth daily.     ? naproxen sodium (ALEVE) 220 MG tablet Take 220 mg by mouth daily as needed (pain).    ? omeprazole (PRILOSEC) 20 MG capsule Take 20 mg by mouth daily in the afternoon. midday    ? PERCOCET 5-325 MG tablet SMARTSIG:1 Each By Mouth Every 4 Hours    ? pramipexole (MIRAPEX) 0.5 MG tablet Take 0.5 mg by mouth at bedtime.    ? ?No current facility-administered medications for this visit.  ? ? ?Review of Systems  ?Constitutional:  Constitutional negative. ?HENT: HENT negative.  ?Eyes: Eyes negative.  ?Cardiovascular: Positive for claudication.  ?GI: Gastrointestinal negative.  ?Musculoskeletal: Musculoskeletal negative.  ?Skin: Skin negative.  ?Neurological: Neurological negative. ?Hematologic: Hematologic/lymphatic negative.  ?Psychiatric: Psychiatric negative.   ? ?   ?Objective:  ?Objective  ? ?Vitals:  ? 11/01/21 1049  ?BP: 124/65  ?Pulse: 85  ?Resp: 20  ?Temp: 97.9 ?F (36.6 ?C)  ?SpO2: 95%  ?Weight: 155 lb (70.3 kg)  ?Height: '5\' 3"'$  (1.6 m)  ? ?Body mass index is 27.46 kg/m?. ? ?Physical Exam ?HENT:  ?   Head: Normocephalic.  ?   Nose: Nose normal.  ?Neck:  ?   Vascular: No carotid bruit.  ?   Comments:  Well-healed right neck incision ?Cardiovascular:  ?   Rate and Rhythm: Normal rate.  ?   Pulses:     ?     Femoral pulses are 2+ on the right side and 2+ on the left side. ?     Popliteal pulses are 0 on the r

## 2021-11-03 ENCOUNTER — Other Ambulatory Visit: Payer: Self-pay | Admitting: *Deleted

## 2021-11-03 DIAGNOSIS — N390 Urinary tract infection, site not specified: Secondary | ICD-10-CM | POA: Diagnosis not present

## 2021-11-03 DIAGNOSIS — I6523 Occlusion and stenosis of bilateral carotid arteries: Secondary | ICD-10-CM

## 2021-11-03 DIAGNOSIS — I70219 Atherosclerosis of native arteries of extremities with intermittent claudication, unspecified extremity: Secondary | ICD-10-CM

## 2021-11-07 DIAGNOSIS — M75122 Complete rotator cuff tear or rupture of left shoulder, not specified as traumatic: Secondary | ICD-10-CM | POA: Diagnosis not present

## 2021-11-07 DIAGNOSIS — M25512 Pain in left shoulder: Secondary | ICD-10-CM | POA: Diagnosis not present

## 2021-11-07 DIAGNOSIS — M19012 Primary osteoarthritis, left shoulder: Secondary | ICD-10-CM | POA: Diagnosis not present

## 2021-11-08 DIAGNOSIS — M75122 Complete rotator cuff tear or rupture of left shoulder, not specified as traumatic: Secondary | ICD-10-CM | POA: Diagnosis not present

## 2021-11-10 DIAGNOSIS — Z9181 History of falling: Secondary | ICD-10-CM | POA: Diagnosis not present

## 2021-11-10 DIAGNOSIS — Z1331 Encounter for screening for depression: Secondary | ICD-10-CM | POA: Diagnosis not present

## 2021-11-10 DIAGNOSIS — E785 Hyperlipidemia, unspecified: Secondary | ICD-10-CM | POA: Diagnosis not present

## 2021-11-10 DIAGNOSIS — Z0000 Encounter for general adult medical examination without abnormal findings: Secondary | ICD-10-CM | POA: Diagnosis not present

## 2021-11-14 DIAGNOSIS — M25512 Pain in left shoulder: Secondary | ICD-10-CM | POA: Diagnosis not present

## 2021-11-17 DIAGNOSIS — N390 Urinary tract infection, site not specified: Secondary | ICD-10-CM | POA: Diagnosis not present

## 2021-11-17 DIAGNOSIS — R309 Painful micturition, unspecified: Secondary | ICD-10-CM | POA: Diagnosis not present

## 2021-12-05 DIAGNOSIS — H53453 Other localized visual field defect, bilateral: Secondary | ICD-10-CM | POA: Diagnosis not present

## 2021-12-05 DIAGNOSIS — H02834 Dermatochalasis of left upper eyelid: Secondary | ICD-10-CM | POA: Diagnosis not present

## 2021-12-05 DIAGNOSIS — H02831 Dermatochalasis of right upper eyelid: Secondary | ICD-10-CM | POA: Diagnosis not present

## 2021-12-18 DIAGNOSIS — J208 Acute bronchitis due to other specified organisms: Secondary | ICD-10-CM | POA: Diagnosis not present

## 2022-01-02 DIAGNOSIS — R3915 Urgency of urination: Secondary | ICD-10-CM | POA: Diagnosis not present

## 2022-01-02 DIAGNOSIS — N3020 Other chronic cystitis without hematuria: Secondary | ICD-10-CM | POA: Diagnosis not present

## 2022-01-02 DIAGNOSIS — N3941 Urge incontinence: Secondary | ICD-10-CM | POA: Diagnosis not present

## 2022-01-10 DIAGNOSIS — Z1231 Encounter for screening mammogram for malignant neoplasm of breast: Secondary | ICD-10-CM | POA: Diagnosis not present

## 2022-01-10 DIAGNOSIS — N959 Unspecified menopausal and perimenopausal disorder: Secondary | ICD-10-CM | POA: Diagnosis not present

## 2022-01-10 DIAGNOSIS — M85852 Other specified disorders of bone density and structure, left thigh: Secondary | ICD-10-CM | POA: Diagnosis not present

## 2022-01-13 ENCOUNTER — Emergency Department (HOSPITAL_COMMUNITY): Payer: PPO

## 2022-01-13 ENCOUNTER — Inpatient Hospital Stay (HOSPITAL_COMMUNITY): Payer: PPO

## 2022-01-13 ENCOUNTER — Inpatient Hospital Stay (HOSPITAL_COMMUNITY)
Admission: EM | Admit: 2022-01-13 | Discharge: 2022-02-13 | DRG: 208 | Disposition: E | Payer: PPO | Attending: Internal Medicine | Admitting: Internal Medicine

## 2022-01-13 DIAGNOSIS — Z87891 Personal history of nicotine dependence: Secondary | ICD-10-CM | POA: Diagnosis not present

## 2022-01-13 DIAGNOSIS — M109 Gout, unspecified: Secondary | ICD-10-CM | POA: Diagnosis not present

## 2022-01-13 DIAGNOSIS — K219 Gastro-esophageal reflux disease without esophagitis: Secondary | ICD-10-CM | POA: Diagnosis not present

## 2022-01-13 DIAGNOSIS — Z20822 Contact with and (suspected) exposure to covid-19: Secondary | ICD-10-CM | POA: Diagnosis not present

## 2022-01-13 DIAGNOSIS — Z66 Do not resuscitate: Secondary | ICD-10-CM | POA: Diagnosis not present

## 2022-01-13 DIAGNOSIS — G931 Anoxic brain damage, not elsewhere classified: Secondary | ICD-10-CM | POA: Diagnosis not present

## 2022-01-13 DIAGNOSIS — R0789 Other chest pain: Secondary | ICD-10-CM | POA: Diagnosis not present

## 2022-01-13 DIAGNOSIS — Z95828 Presence of other vascular implants and grafts: Secondary | ICD-10-CM

## 2022-01-13 DIAGNOSIS — J811 Chronic pulmonary edema: Secondary | ICD-10-CM | POA: Diagnosis present

## 2022-01-13 DIAGNOSIS — J690 Pneumonitis due to inhalation of food and vomit: Secondary | ICD-10-CM | POA: Diagnosis present

## 2022-01-13 DIAGNOSIS — D649 Anemia, unspecified: Secondary | ICD-10-CM | POA: Diagnosis present

## 2022-01-13 DIAGNOSIS — G40409 Other generalized epilepsy and epileptic syndromes, not intractable, without status epilepticus: Secondary | ICD-10-CM | POA: Diagnosis not present

## 2022-01-13 DIAGNOSIS — Z7189 Other specified counseling: Secondary | ICD-10-CM

## 2022-01-13 DIAGNOSIS — E1165 Type 2 diabetes mellitus with hyperglycemia: Secondary | ICD-10-CM | POA: Diagnosis present

## 2022-01-13 DIAGNOSIS — E1151 Type 2 diabetes mellitus with diabetic peripheral angiopathy without gangrene: Secondary | ICD-10-CM | POA: Diagnosis present

## 2022-01-13 DIAGNOSIS — R0689 Other abnormalities of breathing: Secondary | ICD-10-CM | POA: Diagnosis not present

## 2022-01-13 DIAGNOSIS — G253 Myoclonus: Secondary | ICD-10-CM | POA: Diagnosis not present

## 2022-01-13 DIAGNOSIS — G9341 Metabolic encephalopathy: Secondary | ICD-10-CM | POA: Diagnosis not present

## 2022-01-13 DIAGNOSIS — I468 Cardiac arrest due to other underlying condition: Secondary | ICD-10-CM | POA: Diagnosis present

## 2022-01-13 DIAGNOSIS — Z8673 Personal history of transient ischemic attack (TIA), and cerebral infarction without residual deficits: Secondary | ICD-10-CM

## 2022-01-13 DIAGNOSIS — Z515 Encounter for palliative care: Secondary | ICD-10-CM | POA: Diagnosis not present

## 2022-01-13 DIAGNOSIS — F419 Anxiety disorder, unspecified: Secondary | ICD-10-CM | POA: Diagnosis present

## 2022-01-13 DIAGNOSIS — R739 Hyperglycemia, unspecified: Secondary | ICD-10-CM | POA: Insufficient documentation

## 2022-01-13 DIAGNOSIS — Z83438 Family history of other disorder of lipoprotein metabolism and other lipidemia: Secondary | ICD-10-CM

## 2022-01-13 DIAGNOSIS — J9601 Acute respiratory failure with hypoxia: Secondary | ICD-10-CM | POA: Diagnosis not present

## 2022-01-13 DIAGNOSIS — J9602 Acute respiratory failure with hypercapnia: Principal | ICD-10-CM | POA: Diagnosis present

## 2022-01-13 DIAGNOSIS — J9600 Acute respiratory failure, unspecified whether with hypoxia or hypercapnia: Secondary | ICD-10-CM | POA: Insufficient documentation

## 2022-01-13 DIAGNOSIS — I6381 Other cerebral infarction due to occlusion or stenosis of small artery: Secondary | ICD-10-CM | POA: Diagnosis not present

## 2022-01-13 DIAGNOSIS — Z833 Family history of diabetes mellitus: Secondary | ICD-10-CM

## 2022-01-13 DIAGNOSIS — E039 Hypothyroidism, unspecified: Secondary | ICD-10-CM | POA: Diagnosis present

## 2022-01-13 DIAGNOSIS — E785 Hyperlipidemia, unspecified: Secondary | ICD-10-CM | POA: Diagnosis present

## 2022-01-13 DIAGNOSIS — G2581 Restless legs syndrome: Secondary | ICD-10-CM | POA: Diagnosis not present

## 2022-01-13 DIAGNOSIS — I350 Nonrheumatic aortic (valve) stenosis: Secondary | ICD-10-CM | POA: Diagnosis present

## 2022-01-13 DIAGNOSIS — R079 Chest pain, unspecified: Secondary | ICD-10-CM | POA: Diagnosis not present

## 2022-01-13 DIAGNOSIS — Z823 Family history of stroke: Secondary | ICD-10-CM

## 2022-01-13 DIAGNOSIS — Z4659 Encounter for fitting and adjustment of other gastrointestinal appliance and device: Secondary | ICD-10-CM | POA: Diagnosis not present

## 2022-01-13 DIAGNOSIS — Z8249 Family history of ischemic heart disease and other diseases of the circulatory system: Secondary | ICD-10-CM

## 2022-01-13 DIAGNOSIS — J9690 Respiratory failure, unspecified, unspecified whether with hypoxia or hypercapnia: Secondary | ICD-10-CM | POA: Diagnosis not present

## 2022-01-13 DIAGNOSIS — I469 Cardiac arrest, cause unspecified: Principal | ICD-10-CM | POA: Diagnosis present

## 2022-01-13 DIAGNOSIS — R4020 Unspecified coma: Secondary | ICD-10-CM | POA: Diagnosis not present

## 2022-01-13 DIAGNOSIS — R579 Shock, unspecified: Secondary | ICD-10-CM | POA: Diagnosis not present

## 2022-01-13 DIAGNOSIS — I10 Essential (primary) hypertension: Secondary | ICD-10-CM | POA: Diagnosis not present

## 2022-01-13 DIAGNOSIS — Z825 Family history of asthma and other chronic lower respiratory diseases: Secondary | ICD-10-CM

## 2022-01-13 DIAGNOSIS — R0902 Hypoxemia: Secondary | ICD-10-CM | POA: Diagnosis not present

## 2022-01-13 DIAGNOSIS — I739 Peripheral vascular disease, unspecified: Secondary | ICD-10-CM | POA: Insufficient documentation

## 2022-01-13 DIAGNOSIS — Z452 Encounter for adjustment and management of vascular access device: Secondary | ICD-10-CM | POA: Diagnosis not present

## 2022-01-13 LAB — I-STAT ARTERIAL BLOOD GAS, ED
Acid-base deficit: 12 mmol/L — ABNORMAL HIGH (ref 0.0–2.0)
Bicarbonate: 17.1 mmol/L — ABNORMAL LOW (ref 20.0–28.0)
Calcium, Ion: 1.13 mmol/L — ABNORMAL LOW (ref 1.15–1.40)
HCT: 29 % — ABNORMAL LOW (ref 36.0–46.0)
Hemoglobin: 9.9 g/dL — ABNORMAL LOW (ref 12.0–15.0)
O2 Saturation: 100 %
Patient temperature: 36.2
Potassium: 4.6 mmol/L (ref 3.5–5.1)
Sodium: 136 mmol/L (ref 135–145)
TCO2: 19 mmol/L — ABNORMAL LOW (ref 22–32)
pCO2 arterial: 50.6 mmHg — ABNORMAL HIGH (ref 32–48)
pH, Arterial: 7.133 — CL (ref 7.35–7.45)
pO2, Arterial: 265 mmHg — ABNORMAL HIGH (ref 83–108)

## 2022-01-13 LAB — COMPREHENSIVE METABOLIC PANEL
ALT: 57 U/L — ABNORMAL HIGH (ref 0–44)
AST: 73 U/L — ABNORMAL HIGH (ref 15–41)
Albumin: 2.6 g/dL — ABNORMAL LOW (ref 3.5–5.0)
Alkaline Phosphatase: 82 U/L (ref 38–126)
Anion gap: 16 — ABNORMAL HIGH (ref 5–15)
BUN: 28 mg/dL — ABNORMAL HIGH (ref 8–23)
CO2: 19 mmol/L — ABNORMAL LOW (ref 22–32)
Calcium: 8.2 mg/dL — ABNORMAL LOW (ref 8.9–10.3)
Chloride: 105 mmol/L (ref 98–111)
Creatinine, Ser: 1.53 mg/dL — ABNORMAL HIGH (ref 0.44–1.00)
GFR, Estimated: 35 mL/min — ABNORMAL LOW (ref >60)
Glucose, Bld: 370 mg/dL — ABNORMAL HIGH (ref 70–99)
Potassium: 5 mmol/L (ref 3.5–5.1)
Sodium: 140 mmol/L (ref 135–145)
Total Bilirubin: 0.3 mg/dL (ref 0.3–1.2)
Total Protein: 4.7 g/dL — ABNORMAL LOW (ref 6.5–8.1)

## 2022-01-13 LAB — I-STAT CHEM 8, ED
BUN: 36 mg/dL — ABNORMAL HIGH (ref 8–23)
Calcium, Ion: 0.89 mmol/L — CL (ref 1.15–1.40)
Chloride: 111 mmol/L (ref 98–111)
Creatinine, Ser: 1.5 mg/dL — ABNORMAL HIGH (ref 0.44–1.00)
Glucose, Bld: 305 mg/dL — ABNORMAL HIGH (ref 70–99)
HCT: 32 % — ABNORMAL LOW (ref 36.0–46.0)
Hemoglobin: 10.9 g/dL — ABNORMAL LOW (ref 12.0–15.0)
Potassium: 5 mmol/L (ref 3.5–5.1)
Sodium: 133 mmol/L — ABNORMAL LOW (ref 135–145)
TCO2: 16 mmol/L — ABNORMAL LOW (ref 22–32)

## 2022-01-13 LAB — CBC WITH DIFFERENTIAL/PLATELET
Abs Immature Granulocytes: 0.6 10*3/uL — ABNORMAL HIGH (ref 0.00–0.07)
Basophils Absolute: 0 10*3/uL (ref 0.0–0.1)
Basophils Relative: 0 %
Eosinophils Absolute: 0.6 10*3/uL — ABNORMAL HIGH (ref 0.0–0.5)
Eosinophils Relative: 5 %
HCT: 32.7 % — ABNORMAL LOW (ref 36.0–46.0)
Hemoglobin: 9.8 g/dL — ABNORMAL LOW (ref 12.0–15.0)
Lymphocytes Relative: 39 %
Lymphs Abs: 4.5 10*3/uL — ABNORMAL HIGH (ref 0.7–4.0)
MCH: 27.8 pg (ref 26.0–34.0)
MCHC: 30 g/dL (ref 30.0–36.0)
MCV: 92.9 fL (ref 80.0–100.0)
Metamyelocytes Relative: 2 %
Monocytes Absolute: 0.5 10*3/uL (ref 0.1–1.0)
Monocytes Relative: 4 %
Myelocytes: 3 %
Neutro Abs: 5.5 10*3/uL (ref 1.7–7.7)
Neutrophils Relative %: 47 %
Platelets: 160 10*3/uL (ref 150–400)
RBC: 3.52 MIL/uL — ABNORMAL LOW (ref 3.87–5.11)
RDW: 15.7 % — ABNORMAL HIGH (ref 11.5–15.5)
WBC: 11.6 10*3/uL — ABNORMAL HIGH (ref 4.0–10.5)
nRBC: 0.8 % — ABNORMAL HIGH (ref 0.0–0.2)
nRBC: 1 /100 WBC — ABNORMAL HIGH

## 2022-01-13 LAB — PROCALCITONIN: Procalcitonin: <0.1 ng/mL

## 2022-01-13 LAB — MAGNESIUM: Magnesium: 2.4 mg/dL (ref 1.7–2.4)

## 2022-01-13 LAB — GLUCOSE, CAPILLARY: Glucose-Capillary: 411 mg/dL — ABNORMAL HIGH (ref 70–99)

## 2022-01-13 LAB — TSH: TSH: 2.915 u[IU]/mL (ref 0.350–4.500)

## 2022-01-13 LAB — ETHANOL: Alcohol, Ethyl (B): <10 mg/dL (ref <10)

## 2022-01-13 LAB — PROTIME-INR
INR: 1.8 — ABNORMAL HIGH (ref 0.8–1.2)
Prothrombin Time: 20.7 seconds — ABNORMAL HIGH (ref 11.4–15.2)

## 2022-01-13 LAB — SALICYLATE LEVEL: Salicylate Lvl: <7 mg/dL — ABNORMAL LOW (ref 7.0–30.0)

## 2022-01-13 LAB — TROPONIN I (HIGH SENSITIVITY)
Troponin I (High Sensitivity): 326 ng/L (ref <18)
Troponin I (High Sensitivity): 2403 ng/L (ref <18)

## 2022-01-13 LAB — LIPASE, BLOOD: Lipase: 24 U/L (ref 11–51)

## 2022-01-13 LAB — APTT: aPTT: 65 seconds — ABNORMAL HIGH (ref 24–36)

## 2022-01-13 LAB — ACETAMINOPHEN LEVEL: Acetaminophen (Tylenol), Serum: <10 ug/mL — ABNORMAL LOW (ref 10–30)

## 2022-01-13 LAB — BRAIN NATRIURETIC PEPTIDE: B Natriuretic Peptide: 723.2 pg/mL — ABNORMAL HIGH (ref 0.0–100.0)

## 2022-01-13 LAB — AMMONIA: Ammonia: 56 umol/L — ABNORMAL HIGH (ref 9–35)

## 2022-01-13 LAB — SARS CORONAVIRUS 2 BY RT PCR: SARS Coronavirus 2 by RT PCR: NEGATIVE

## 2022-01-13 LAB — LACTIC ACID, PLASMA: Lactic Acid, Venous: 7.6 mmol/L (ref 0.5–1.9)

## 2022-01-13 LAB — CBG MONITORING, ED: Glucose-Capillary: 262 mg/dL — ABNORMAL HIGH (ref 70–99)

## 2022-01-13 MED ORDER — SODIUM CHLORIDE 0.9 % IV SOLN
250.0000 mL | INTRAVENOUS | Status: DC
Start: 2022-01-13 — End: 2022-01-14
  Administered 2022-01-13: 1000 mL via INTRAVENOUS

## 2022-01-13 MED ORDER — FENTANYL 2500MCG IN NS 250ML (10MCG/ML) PREMIX INFUSION
25.0000 ug/h | INTRAVENOUS | Status: DC
  Administered 2022-01-13: 50 ug/h via INTRAVENOUS
  Filled 2022-01-13: qty 250

## 2022-01-13 MED ORDER — ACETAMINOPHEN 160 MG/5ML PO SOLN: 650.0000 mg | ORAL | Status: DC | PRN

## 2022-01-13 MED ORDER — DOCUSATE SODIUM 50 MG/5ML PO LIQD
100.0000 mg | Freq: Two times a day (BID) | ORAL | Status: DC
  Administered 2022-01-14 (×2): 100 mg
  Filled 2022-01-13: qty 10

## 2022-01-13 MED ORDER — HEPARIN (PORCINE) 25000 UT/250ML-% IV SOLN
850.0000 [IU]/h | INTRAVENOUS | Status: DC
  Administered 2022-01-14: 850 [IU]/h via INTRAVENOUS
  Filled 2022-01-13: qty 250

## 2022-01-13 MED ORDER — ACETAMINOPHEN 650 MG RE SUPP: 650.0000 mg | RECTAL | Status: DC | PRN

## 2022-01-13 MED ORDER — NOREPINEPHRINE 4 MG/250ML-% IV SOLN
0.0000 ug/min | INTRAVENOUS | Status: DC
  Administered 2022-01-14: 30 ug/min via INTRAVENOUS
  Administered 2022-01-14: 15 ug/min via INTRAVENOUS
  Administered 2022-01-14 (×2): 50 ug/min via INTRAVENOUS
  Filled 2022-01-13: qty 250
  Filled 2022-01-13: qty 500
  Filled 2022-01-13: qty 250
  Filled 2022-01-13 (×2): qty 500

## 2022-01-13 MED ORDER — FENTANYL CITRATE PF 50 MCG/ML IJ SOSY: 25.0000 ug | PREFILLED_SYRINGE | Freq: Once | INTRAMUSCULAR | Status: DC

## 2022-01-13 MED ORDER — ACETAMINOPHEN 160 MG/5ML PO SOLN
650.0000 mg | ORAL | Status: DC
  Administered 2022-01-14 (×2): 650 mg
  Filled 2022-01-13 (×2): qty 20.3

## 2022-01-13 MED ORDER — ACETAMINOPHEN 325 MG PO TABS: 650.0000 mg | ORAL_TABLET | ORAL | Status: DC

## 2022-01-13 MED ORDER — IPRATROPIUM-ALBUTEROL 0.5-2.5 (3) MG/3ML IN SOLN: 3.0000 mL | Freq: Four times a day (QID) | RESPIRATORY_TRACT | Status: DC | PRN

## 2022-01-13 MED ORDER — ORAL CARE MOUTH RINSE: 15.0000 mL | OROMUCOSAL | Status: DC | PRN

## 2022-01-13 MED ORDER — PANTOPRAZOLE 2 MG/ML SUSPENSION
40.0000 mg | Freq: Every day | ORAL | Status: DC
  Administered 2022-01-14: 40 mg
  Filled 2022-01-13 (×2): qty 20

## 2022-01-13 MED ORDER — INSULIN ASPART 100 UNIT/ML IJ SOLN: 0.0000 [IU] | INTRAMUSCULAR | Status: DC

## 2022-01-13 MED ORDER — PIPERACILLIN-TAZOBACTAM 3.375 G IVPB 30 MIN: 3.3750 g | Freq: Once | INTRAVENOUS | Status: DC

## 2022-01-13 MED ORDER — FENTANYL BOLUS VIA INFUSION
25.0000 ug | INTRAVENOUS | Status: DC | PRN
  Administered 2022-01-13: 25 ug via INTRAVENOUS

## 2022-01-13 MED ORDER — LACTULOSE 10 GM/15ML PO SOLN
30.0000 g | Freq: Once | ORAL | Status: AC
  Administered 2022-01-14: 30 g via ORAL
  Filled 2022-01-13: qty 45

## 2022-01-13 MED ORDER — PIPERACILLIN-TAZOBACTAM 3.375 G IVPB 30 MIN
3.3750 g | Freq: Once | INTRAVENOUS | Status: DC
Start: 2022-01-13 — End: 2022-01-13

## 2022-01-13 MED ORDER — BUSPIRONE HCL 10 MG PO TABS: 30.0000 mg | ORAL_TABLET | Freq: Three times a day (TID) | ORAL | Status: DC | PRN

## 2022-01-13 MED ORDER — NOREPINEPHRINE 4 MG/250ML-% IV SOLN
2.0000 ug/min | INTRAVENOUS | Status: DC
  Administered 2022-01-13 (×2): 2 ug/min via INTRAVENOUS
  Filled 2022-01-13: qty 250

## 2022-01-13 MED ORDER — CHLORHEXIDINE GLUCONATE CLOTH 2 % EX PADS
6.0000 | MEDICATED_PAD | Freq: Every day | CUTANEOUS | Status: DC
Start: 2022-01-14 — End: 2022-01-14

## 2022-01-13 MED ORDER — ORAL CARE MOUTH RINSE
15.0000 mL | OROMUCOSAL | Status: DC
  Administered 2022-01-14 (×8): 15 mL via OROMUCOSAL

## 2022-01-13 MED ORDER — POLYETHYLENE GLYCOL 3350 17 G PO PACK
17.0000 g | PACK | Freq: Every day | ORAL | Status: DC
  Administered 2022-01-14: 17 g
  Filled 2022-01-13: qty 1

## 2022-01-13 MED ORDER — PIPERACILLIN-TAZOBACTAM 3.375 G IVPB
3.3750 g | Freq: Three times a day (TID) | INTRAVENOUS | Status: DC
  Administered 2022-01-14: 3.375 g via INTRAVENOUS
  Filled 2022-01-13: qty 50

## 2022-01-13 MED ORDER — ACETAMINOPHEN 650 MG RE SUPP
650.0000 mg | RECTAL | Status: DC
  Administered 2022-01-13: 650 mg via RECTAL
  Filled 2022-01-13: qty 1

## 2022-01-13 MED ORDER — ACETAMINOPHEN 325 MG PO TABS: 650.0000 mg | ORAL_TABLET | ORAL | Status: DC | PRN

## 2022-01-13 MED ORDER — LACTATED RINGERS IV BOLUS
1000.0000 mL | Freq: Once | INTRAVENOUS | Status: AC
  Administered 2022-01-14: 1000 mL via INTRAVENOUS

## 2022-01-13 MED ORDER — LEVOTHYROXINE SODIUM 88 MCG PO TABS
88.0000 ug | ORAL_TABLET | Freq: Every day | ORAL | Status: DC
Start: 2022-01-14 — End: 2022-01-14
  Administered 2022-01-14: 88 ug
  Filled 2022-01-13: qty 1

## 2022-01-13 MED ORDER — PROPOFOL 1000 MG/100ML IV EMUL: 0.0000 ug/kg/min | INTRAVENOUS | Status: DC

## 2022-01-13 MED ORDER — EPINEPHRINE HCL 5 MG/250ML IV SOLN IN NS
0.5000 ug/min | INTRAVENOUS | Status: DC
  Administered 2022-01-13: 5 ug/min via INTRAVENOUS

## 2022-01-13 MED ORDER — MAGNESIUM SULFATE 2 GM/50ML IV SOLN
2.0000 g | Freq: Once | INTRAVENOUS | Status: DC | PRN
  Filled 2022-01-13: qty 50

## 2022-01-13 NOTE — ED Provider Notes (Addendum)
[facility] EMERGENCY DEPARTMENT Provider Note   CSN: [id number] Arrival date & time: 02/05/2022  1948     History  Chief Complaint  Patient presents with   Cardiac Arrest    Meredith Young is a 76 y.o. female.  Patient brought in by [facility].  When she arrived she was pulseless and not breathing.  Cardiac resuscitation began immediately.  Story was that they were called out for shortness of breath.  They stated that patient was having difficulty breathing and was tripoding.  They started her on CPAP on the way in but she started vomiting so they switched over to 100% nonrebreather.  Patient was talking to them and was cooperative on the way in.  They reported the patient had a recent history of some vascular procedure and patient known to have peripheral artery disease as well.  Patient arrived at 59.  Initial rhythm was asystole.  Resuscitation began.  We got pulses back twice before we get sustain pulse.  As sustained pulse by 2004.  And then patient remained stable.  This occurred after patient was intubated.  Patient received a total of 5 rounds of epi.  And 2 of bicarb.  Chart review shows an extensive past medical history carotid artery occlusion hypothyroidism diabetes hyperlipidemia gout idiopathic peripheral neuropathy hypertension gastroesophageal reflux disease TIA in 2017 occlusion of stenosis and stenosis of the carotid artery without infarction in 2014.  Carotid stenosis bilaterally mentioned in 2017 history of palpitations in 2017.  Past surgical history significant for gallbladder removal in 1991 appendectomy 1967 abdominal hysterectomy 1977 endarterectomy on the right in 2017 patch angioplasty on the right in 2017 left heart cath in 2022 no mention of any cyst stents.  Abdominal aortogram done in March 2023 and peripheral vascular intervention bilaterally in March 2023 by vascular surgery.  Patient is a former smoker quit in 1990.       Home  Medications Prior to Admission medications   Medication Sig Start Date End Date Taking? Authorizing Provider  acetaminophen (TYLENOL) 500 MG tablet Take 500-1,000 mg by mouth every 6 (six) hours as needed for mild pain or moderate pain.    [provider]  allopurinol (ZYLOPRIM) 300 MG tablet Take 300 mg by mouth daily in the afternoon. midday    [provider]  amLODipine (NORVASC) 5 MG tablet Take 5 mg by mouth daily in the afternoon. midday 03/27/17   [provider]  Apoaequorin (PREVAGEN) 10 MG CAPS Take 10 mg by mouth daily.    [provider]  aspirin EC 81 MG tablet Take 81 mg by mouth at bedtime.    [provider]  carboxymethylcellulose (REFRESH PLUS) 0.5 % SOLN Place 1 drop into both eyes 2 (two) times daily as needed (dry eyes).    [provider]  clopidogrel (PLAVIX) 75 MG tablet Take 1 tablet (75 mg total) by mouth daily. 09/25/21 09/25/22  Waynetta Sandy, MD  Dulaglutide (TRULICITY) 1.5 QZ/0.0PQ SOPN Inject 1.5 mg into the skin every Saturday.    [provider]  fexofenadine (ALLEGRA) 180 MG tablet Take 180 mg by mouth daily as needed for allergies or rhinitis.    [provider]  folic acid (FOLVITE) 1 MG tablet Take 1 mg by mouth in the morning and at bedtime. 05/08/19   [provider]  glimepiride (AMARYL) 1 MG tablet Take 1 mg by mouth daily as needed (blood sugar greater 150.). 03/18/19   [provider]  levothyroxine (SYNTHROID) 88 MCG tablet Take 88 mcg by mouth daily before breakfast.    [provider]  LORazepam (ATIVAN) 0.5 MG tablet Take 0.5 mg by mouth 2 (two) times daily as needed (panic attacks). 04/08/20   [provider]  losartan (COZAAR) 100 MG tablet Take 100 mg by mouth at bedtime. 02/28/17   [provider]  metoprolol tartrate (LOPRESSOR) 25 MG tablet Take 12.5 mg by mouth 2 (two) times daily.    [provider]  Multiple  Vitamins-Minerals (MULTIVITAMIN PO) Take 1 tablet by mouth daily.     [provider]  naproxen sodium (ALEVE) 220 MG tablet Take 220 mg by mouth daily as needed (pain).    [provider]  omeprazole (PRILOSEC) 20 MG capsule Take 20 mg by mouth daily in the afternoon. midday    [provider]  PERCOCET 5-325 MG tablet SMARTSIG:1 Each By Mouth Every 4 Hours 10/20/21   [provider]  pramipexole (MIRAPEX) 0.5 MG tablet Take 0.5 mg by mouth at bedtime. 07/18/21   [provider]      Allergies    Statins, Codeine, Demerol [meperidine], Diltiazem, and Metformin    Review of Systems   Review of Systems  Unable to perform ROS: Patient unresponsive    Physical Exam Updated Vital Signs BP (!) 110/54   Pulse (!) 109   Temp (!) 97.1 F (36.2 C) (Temporal)   Resp (!) 27   Ht 1.6 m ('5\' 3"'$ )   Wt 78.8 kg   SpO2 95%   BMI 30.77 kg/m  Physical Exam Vitals and nursing note reviewed.  Constitutional:      General: She is in acute distress.     Appearance: She is well-developed.     Comments: Patient unresponsive.  Pale CPR ongoing  HENT:     Head: Normocephalic and atraumatic.  Eyes:     Conjunctiva/sclera: Conjunctivae normal.     Comments: Pupils 4 mm nonreactive no corneal reflex.  Cardiovascular:     Rate and Rhythm: Normal rate and regular rhythm.  Pulmonary:     Effort: Respiratory distress present.     Comments: No spontaneous respirations.  Patient initially being bagged and intubated. Abdominal:     General: There is distension.     Palpations: Abdomen is soft.  Musculoskeletal:        General: No swelling.     Cervical back: Neck supple.  Skin:    General: Skin is warm and dry.     Capillary Refill: Capillary refill takes less than 2 seconds.  Neurological:     Comments: No movement at all.  Psychiatric:        Mood and Affect: Mood normal.     ED Results / Procedures / Treatments   Labs (all labs ordered are listed,  but only abnormal results are displayed) Labs Reviewed  CBG MONITORING, ED - Abnormal; Notable for the following components:      Result Value   Glucose-Capillary 262 (*)    All other components within normal limits  I-STAT CHEM 8, ED - Abnormal; Notable for the following components:   Sodium 133 (*)    BUN 36 (*)    Creatinine, Ser 1.50 (*)    Glucose, Bld 305 (*)    Calcium, Ion 0.89 (*)    TCO2 16 (*)    Hemoglobin 10.9 (*)    HCT 32.0 (*)    All other components within normal limits  I-STAT ARTERIAL BLOOD  GAS, ED - Abnormal; Notable for the following components:   pH, Arterial 7.133 (*)    pCO2 arterial 50.6 (*)    pO2, Arterial 265 (*)    Bicarbonate 17.1 (*)    TCO2 19 (*)    Acid-base deficit 12.0 (*)    Calcium, Ion 1.13 (*)    HCT 29.0 (*)    Hemoglobin 9.9 (*)    All other components within normal limits  SARS CORONAVIRUS 2 BY RT PCR  CULTURE, BLOOD (ROUTINE X 2)  CULTURE, BLOOD (ROUTINE X 2)  URINE CULTURE  CULTURE, RESPIRATORY W GRAM STAIN  BLOOD GAS, ARTERIAL  TRIGLYCERIDES  CBC WITH DIFFERENTIAL/PLATELET  LIPASE, BLOOD  COMPREHENSIVE METABOLIC PANEL  BRAIN NATRIURETIC PEPTIDE  COMPREHENSIVE METABOLIC PANEL  MAGNESIUM  LACTIC ACID, PLASMA  LACTIC ACID, PLASMA  PROCALCITONIN  BRAIN NATRIURETIC PEPTIDE  CBC  PROTIME-INR  APTT  URINALYSIS, ROUTINE W REFLEX MICROSCOPIC  RAPID URINE DRUG SCREEN, HOSP PERFORMED  ETHANOL  ACETAMINOPHEN LEVEL  SALICYLATE LEVEL  HEMOGLOBIN A1C  BLOOD GAS, ARTERIAL  I-STAT CHEM 8, ED  TROPONIN I (HIGH SENSITIVITY)    EKG EKG Interpretation  Date/Time:  Saturday January 13 2022 20:41:24 EDT Ventricular Rate:  117 PR Interval:  151 QRS Duration: 126 QT Interval:  355 QTC Calculation: 496 R Axis:   201 Text Interpretation: Sinus tachycardia Nonspecific intraventricular conduction delay Repol abnrm, severe global ischemia (LM/MVD) Confirmed by Fredia Sorrow [phone]) on 01/20/2022 9:26:43 PM  Radiology DG Abdomen 1  View  Result Date: 01/30/2022 CLINICAL DATA:  Check gastric catheter placement EXAM: ABDOMEN - 1 VIEW COMPARISON:  None Available. FINDINGS: Gastric catheter is noted within the distal stomach. No free air is seen. No obstructive changes are noted. IMPRESSION: Gastric catheter within the stomach. Electronically Signed   By: Inez Catalina M.D.   On: 01/25/2022 20:35   DG Chest Portable 1 View  Result Date: 01/19/2022 CLINICAL DATA:  Check endotracheal tube, recent cardiac arrest EXAM: PORTABLE CHEST 1 VIEW COMPARISON:  05/13/2019 FINDINGS: Cardiac shadow is within normal limits. Aortic calcifications are noted. Diffuse vascular congestion and parenchymal opacities are noted most consistent with pulmonary edema. Endotracheal tube is noted in satisfactory position. Gastric catheter is coiled within the stomach. No bony abnormality is seen. IMPRESSION: Tubes and lines as described. Vascular congestion and parenchymal opacities most consistent with pulmonary edema. Electronically Signed   By: Inez Catalina M.D.   On: 01/20/2022 20:33    Procedures Date/Time: 01/18/2022 9:24 PM  Performed by: Fredia Sorrow, MDPre-anesthesia Checklist: Suction available, Emergency Drugs available and Patient being monitored Laryngoscope Size: Glidescope and 4 Tube size: 7.5 mm Number of attempts: 1 Airway Equipment and Method: Rigid stylet Placement Confirmation: ETT inserted through vocal cords under direct vision, Breath sounds checked- equal and bilateral and CO2 detector Secured at: 23 cm Tube secured with: ETT holder        Medications Ordered in ED Medications  EPINEPHrine (ADRENALIN) 5 mg in NS 250 mL (0.02 mg/mL) premix infusion (5 mcg/min Intravenous New Bag/Given 01/30/2022 1954)  docusate (COLACE) 50 MG/5ML liquid 100 mg (has no administration in time range)  polyethylene glycol (MIRALAX / GLYCOLAX) packet 17 g (has no administration in time range)  fentaNYL (SUBLIMAZE) injection 25 mcg (has no  administration in time range)  fentaNYL 2562mg in NS 2558m(1027mml) infusion-PREMIX (50 mcg/hr Intravenous New Bag/Given 01/24/2022 2106)  fentaNYL (SUBLIMAZE) bolus via infusion 25-100 mcg (has no administration in time range)  propofol (DIPRIVAN) 1000 MG/100ML infusion (has no administration  in time range)  pantoprazole sodium (PROTONIX) 40 mg/20 mL oral suspension 40 mg (has no administration in time range)  ipratropium-albuterol (DUONEB) 0.5-2.5 (3) MG/3ML nebulizer solution 3 mL (has no administration in time range)  insulin aspart (novoLOG) injection 0-9 Units (has no administration in time range)  piperacillin-tazobactam (ZOSYN) IVPB 3.375 g (has no administration in time range)  acetaminophen (TYLENOL) tablet 650 mg (has no administration in time range)    Or  acetaminophen (TYLENOL) 160 MG/5ML solution 650 mg (has no administration in time range)    Or  acetaminophen (TYLENOL) suppository 650 mg (has no administration in time range)  acetaminophen (TYLENOL) tablet 650 mg (has no administration in time range)    Or  acetaminophen (TYLENOL) 160 MG/5ML solution 650 mg (has no administration in time range)    Or  acetaminophen (TYLENOL) suppository 650 mg (has no administration in time range)  busPIRone (BUSPAR) tablet 30 mg (has no administration in time range)    Or  busPIRone (BUSPAR) tablet 30 mg (has no administration in time range)  magnesium sulfate IVPB 2 g 50 mL (has no administration in time range)  norepinephrine (LEVOPHED) '4mg'$  in 244m (0.016 mg/mL) premix infusion (has no administration in time range)  0.9 %  sodium chloride infusion (has no administration in time range)    ED Course/ Medical Decision Making/ A&P                           Medical Decision Making Amount and/or Complexity of Data Reviewed Labs: ordered. Radiology: ordered.  Risk Prescription drug management. Decision regarding hospitalization.   CRITICAL CARE Performed by: SFredia SorrowTotal  critical care time: 60 minutes Critical care time was exclusive of separately billable procedures and treating other patients. Critical care was necessary to treat or prevent imminent or life-threatening deterioration. Critical care was time spent personally by me on the following activities: development of treatment plan with patient and/or surrogate as well as nursing, discussions with consultants, evaluation of patient's response to treatment, examination of patient, obtaining history from patient or surrogate, ordering and performing treatments and interventions, ordering and review of laboratory studies, ordering and review of radiographic studies, pulse oximetry and re-evaluation of patient's condition.  Patient immediately upon arrival was noted to be in full cardiac arrest no carotid pulse.  Not breathing.  CPR started upon arrival at 180  We had sustained return of circulation by 2004.  We had a couple times in between where we had it briefly.  Patient seemed to improve significantly after intubation.  During intubation noted that there had been some vomiting and there was evidence of vomit in the lung area or tracheal area.  After 5 of epi 2 of bicarb and intubation patient had sustained pulse and was receiving epinephrine drip to maintain blood pressure.  But no movement witnessed by me.  And no evidence of any blinking of her eyes.  Critical care contacted.  They will admit.  Chest x-ray consistent maybe with pulmonary edema bilaterally.  Is also high probability there was an aspiration that occurred.  Patient's blood gas pH 7.133 PCO2 of 50 PO2 of 265.  I-STAT Chem-8 significant for glucose 305 sodium 133 potassium 5.0 creatinine of 1.15 CO2 of 16  EKG consistent with a sinus tach.   Final Clinical Impression(s) / ED Diagnoses Final diagnoses:  Cardiac arrest ([facility]    Rx / DC Orders ED Discharge Orders     None  Fredia Sorrow, MD 02/07/2022 2125    Fredia Sorrow, MD 01/22/2022 2127

## 2022-01-13 NOTE — Progress Notes (Signed)
An USGPIV (ultrasound guided PIV) has been placed for short-term vasopressor infusion. A correctly placed ivWatch must be used when administering Vasopressors. Should this treatment be needed beyond 72 hours, central line access should be obtained.  It will be the responsibility of the bedside nurse to follow best practice to prevent extravasations.   ?

## 2022-01-13 NOTE — Progress Notes (Signed)
Sputum sample collected from Bronch and sent to lab.

## 2022-01-13 NOTE — Progress Notes (Signed)
Troponin elevated from 326 to 2,403. CT head negative for bleed. Will start heparin gtt. Will likely need cards consult in am.  JD Geryl Rankins [facility] 02/09/2022, 11:45 PM  Please see [email or web address] for pager details.  From 7A-7P if no response, please call [phone]. After hours, please call ELink [phone].

## 2022-01-13 NOTE — H&P (Addendum)
NAME:  Meredith Young, MRN:  [id number], DOB:  1945/09/11, LOS: 0 ADMISSION DATE:  01/17/2022, CONSULTATION DATE:  7/1 REFERRING MD:  Dr. Rogene Houston, CHIEF COMPLAINT:  cardiac arrest   History of Present Illness:  Patient is a 76 year old female with pertinent PMH of HTN, HLD, aortic stenosis, PAD, carotid stenosis, hypothyroidism, DMT2, TIA presents to [facility] ED on 7/1 with cardiac arrest.  On 7/1 patient was experiencing SOB and HTN.  [place] called and was transferring patient to [facility].  In route patient was placed on CPAP for WOB and tripod breathing.  Sats were 70% on RA.  Right before patient arrived to [facility] ED, patient lost consciousness and vomited and CPAP mask.  Patient placed on NRB.  Faint carotid pulse present and GCS 3.  Patient arrived to [facility] ED.  Once patient moved onto bed patient had no pulse and CPR initiated.  Rhythm initially asystole and first pulse was received after 13 minutes.  Patient lost pulse and ROSC was quickly obtained a few more times.  Patient intubated.  Total ROSC 20 minutes.  Received 5 of epi and 2 bicarb.  Patient remained unresponsive.  Started on epi drip. Labs pending. PCCM consulted for ICU admission.  Pertinent  Medical History   Past Medical History:  Diagnosis Date   Ankle fracture, left, closed, initial encounter 02/02/2014   Anxiety 04/28/2014   Aortic stenosis 02/24/2015   Overview:  Very mild Nov 2015   Carotid artery occlusion    Carotid stenosis, bilateral 02/27/2016   Overview:  R ICA > 70%L ICA 50-69%   Cerebrovascular disease 04/28/2014   Chronic right shoulder pain 2/72/5366   Complication of anesthesia    Depression 04/28/2014   Diabetes mellitus type II, controlled ([place]) 04/28/2014   Diabetes mellitus without complication ([place])    Diverticulitis of colon    Essential hypertension 04/28/2014   GERD (gastroesophageal reflux disease)    Gout    Gout    Headache    History of colonic polyps 04/28/2014   History of hiatal hernia    Hx  of colonic polyps    Hyperlipidemia    Hypertension    HEART BEATS FAST    Hypothyroidism    Idiopathic peripheral neuropathy    Ingrown nail of great toe of left foot 02/11/2017   Ingrown nail of great toe of right foot 01/04/2017   Low back pain 04/28/2014   OA (osteoarthritis) 04/28/2014   Occlusion and stenosis of carotid artery without mention of cerebral infarction 11/25/2012   Osteoarthritis    Palpitation 01/27/2016   Paronychia of great toe, left 02/11/2017   Paronychia of great toe, right 01/04/2017   Pernicious anemia 04/28/2014   PONV (postoperative nausea and vomiting)    RLS (restless legs syndrome) 04/28/2014   Rosacea 04/28/2014   S/P rotator cuff repair 09/17/2016   TIA (transient ischemic attack)    01/2016     Significant Hospital Events: Including procedures, antibiotic start and stop dates in addition to other pertinent events   7/1 admitted to [facility] initially respiratory distress arrived pulseless; cardiac arrest initiated; ROSC about 20 minutes;  intubated  Interim History / Subjective:  Patient intubated on PRVC; agonal breathing appreciated Vitals stable On 2.5 epi drip No cough/gag reflex, pupils equal sluggish reactive  Objective   Blood pressure (!) 227/99, pulse (!) 155, temperature (!) 97.1 F (36.2 C), temperature source Temporal, resp. rate 17, SpO2 100 %.       No intake or output  data in the 24 hours ending 01/21/2022 2021 There were no vitals filed for this visit.  Examination: General:   critically ill appearing on mech vent HEENT: MM pink/moist; ETT in place Neuro: minimal cough/gag reflex, pupils equal 2 mm sluggish reactive CV: s1s2, no m/r/g PULM:  dim clear BS bilaterally; on mech vent PRVC GI: soft, bsx4 active  Extremities: warm/dry, BLE trace edema  Skin: no rashes or lesions appreciated  Resolved Hospital Problem list     Assessment & Plan:   Cardiac Arrest: Likely respiratory related; total ROSC 20 minutes P: -will admit to  ICU w/ continuous telemetry monitoring -wean epi for MAP goal >65 -trend troponin and lactate -check ABG -check CMP, Mag: replete electrolytes as needed -echo -CT head -send UDS, ethanol, salicylate, acetaminophen level -EEG -check cultures: bcx2, urine culture/UA, and trach culture -will start on abx by asp ppx  -check procalcitonin -CXR -TTM normothermia protocol in place  Acute respiratory failure with hypercarbia Possible aspiration pneumonia and pulmonary edema P: -LTVV strategy with tidal volumes of 6-8 cc/kg ideal body weight -Initial ABG showing acidemia; increased RR to 32; repeat ABG in a few hours -Goal plateau pressures less than 30 and driving pressures less than 15 -Wean PEEP/FiO2 for SpO2 >92% -VAP bundle in place -Daily SAT and SBT when appropriate -PAD protocol in place -wean sedation for RASS goal 0 to -1 -Follow intermittent CXR and ABG PRN -Start patient on Zosyn for asp PPx -Check trach culture and PCT -Will likely need bronch -consider lasix  Acute metabolic encephalopathy P: -Likely hypercarbia related; adjust vent settings for normocapnia -Check CT head -EEG -Check TSH and ammonia -Check UDS, ethanol, acetaminophen, salicylate level -Limit sedating meds -Pad protocol for RASS 0 to -1  Hyperglycemia DMT2 P: -check a1c -ssi and cbg monitoring  Leukocytosis P: -trend wbc -abx as above -f/u cultures -check pct  Anemia P: -trend CBC  Moderate Aortic stenosis HTN/HLD P: -hold home anti-hypertensive -not able to take statin due to myalgias -check echo  Carotid stenosis s/p R endarterectomy TIA: 2017 P: -check CT head -consider resuming ASA and plavix if negative for bleed  Hx of hypothyroidism P: -check TSH -continue synthroid  Hx of depression/anxiety P: -hold home ativan  PAD: 09/2021 R common iliac artery stent placed P: -ASA and plavix if CT negative -monitor for pulses  GERD P: -PPI  RLS P: -hold home  mirapex  Gout P: -hold allopurinol    Best Practice (right click and "Reselect all SmartList Selections" daily)   Diet/type: NPO w/ meds via tube DVT prophylaxis: SCD GI prophylaxis: PPI Lines: N/A Foley:  N/A Code Status:  full code Last date of multidisciplinary goals of care discussion [7/1 spoke with son in conference room and updated. He states he would like to continue full code and do everything for now.]  Labs   CBC: Recent Labs  Lab 01/23/2022 2009  HGB 10.9*  HCT 32.0*    Basic Metabolic Panel: Recent Labs  Lab 02/05/2022 2009  NA 133*  K 5.0  CL 111  GLUCOSE 305*  BUN 36*  CREATININE 1.50*   GFR: CrCl cannot be calculated (Unknown ideal weight.). No results for input(s): "PROCALCITON", "WBC", "LATICACIDVEN" in the last 168 hours.  Liver Function Tests: No results for input(s): "AST", "ALT", "ALKPHOS", "BILITOT", "PROT", "ALBUMIN" in the last 168 hours. No results for input(s): "LIPASE", "AMYLASE" in the last 168 hours. No results for input(s): "AMMONIA" in the last 168 hours.  ABG    Component  Value Date/Time   TCO2 16 (L) 01/18/2022 2009     Coagulation Profile: No results for input(s): "INR", "PROTIME" in the last 168 hours.  Cardiac Enzymes: No results for input(s): "CKTOTAL", "CKMB", "CKMBINDEX", "TROPONINI" in the last 168 hours.  HbA1C: Hgb A1c MFr Bld  Date/Time Value Ref Range Status  02/22/2016 03:23 PM 6.7 (H) 4.8 - 5.6 % Final    Comment:    (NOTE)         Pre-diabetes: 5.7 - 6.4         Diabetes: >6.4         Glycemic control for adults with diabetes: <7.0     CBG: Recent Labs  Lab 02/06/2022 1953  GLUCAP 262*    Review of Systems:   Patient is encephalopathic and/or intubated. Therefore history has been obtained from chart review.    Past Medical History:  She,  has a past medical history of Ankle fracture, left, closed, initial encounter (02/02/2014), Anxiety (04/28/2014), Aortic stenosis (02/24/2015), Carotid artery  occlusion, Carotid stenosis, bilateral (02/27/2016), Cerebrovascular disease (04/28/2014), Chronic right shoulder pain (1/49/7026), Complication of anesthesia, Depression (04/28/2014), Diabetes mellitus type II, controlled ([place]) (04/28/2014), Diabetes mellitus without complication ([place]), Diverticulitis of colon, Essential hypertension (04/28/2014), GERD (gastroesophageal reflux disease), Gout, Gout, Headache, History of colonic polyps (04/28/2014), History of hiatal hernia, colonic polyps, Hyperlipidemia, Hypertension, Hypothyroidism, Idiopathic peripheral neuropathy, Ingrown nail of great toe of left foot (02/11/2017), Ingrown nail of great toe of right foot (01/04/2017), Low back pain (04/28/2014), OA (osteoarthritis) (04/28/2014), Occlusion and stenosis of carotid artery without mention of cerebral infarction (11/25/2012), Osteoarthritis, Palpitation (01/27/2016), Paronychia of great toe, left (02/11/2017), Paronychia of great toe, right (01/04/2017), Pernicious anemia (04/28/2014), PONV (postoperative nausea and vomiting), RLS (restless legs syndrome) (04/28/2014), Rosacea (04/28/2014), S/P rotator cuff repair (09/17/2016), and TIA (transient ischemic attack).   Surgical History:   Past Surgical History:  Procedure Laterality Date   ABDOMINAL AORTOGRAM W/LOWER EXTREMITY Bilateral 09/25/2021   Procedure: ABDOMINAL AORTOGRAM W/LOWER EXTREMITY;  Surgeon: Waynetta Sandy, MD;  Location: [place] [facility];  Service: Cardiovascular;  Laterality: Bilateral;   ABDOMINAL HYSTERECTOMY  1977   ANKLE SURGERY     LEFT   APPENDECTOMY  1967   CHOLECYSTECTOMY  1991   COLONOSCOPY  06/04/2011   Colonic polyps status post polypectomy. MOderate redominantly sigmoid diverticuolosis. Small internal hemorrhoids.   ENDARTERECTOMY Right 02/27/2016   Procedure: RIGHT ENDARTERECTOMY CAROTID;  Surgeon: Waynetta Sandy, MD;  Location: [facility] OR;  Service: Vascular;  Laterality: Right;   ESOPHAGOGASTRODUODENOSCOPY   01/21/2008   Erosive gastritis. Otherwise normal EGD   LEFT HEART CATH AND CORONARY ANGIOGRAPHY N/A 01/03/2021   Procedure: LEFT HEART CATH AND CORONARY ANGIOGRAPHY;  Surgeon: Leonie Man, MD;  Location: [place] [facility];  Service: Cardiovascular;  Laterality: N/A;   [place] Right 02/27/2016   Procedure: PATCH ANGIOPLASTY USING Rueben Bash BIOLOGIC PATCH;  Surgeon: Waynetta Sandy, MD;  Location: [place];  Service: Vascular;  Laterality: Right;   PERIPHERAL VASCULAR INTERVENTION Bilateral 09/25/2021   Procedure: PERIPHERAL VASCULAR INTERVENTION;  Surgeon: Waynetta Sandy, MD;  Location: [place] [facility];  Service: Cardiovascular;  Laterality: Bilateral;  iliacs   POLYPECTOMY  1985   TOOTH EXTRACTION       Social History:   reports that she has quit smoking. She started smoking about 33 years ago. She has never used smokeless tobacco. She reports that she does not drink alcohol and does not use  drugs.   Family History:  Her family history includes COPD in her mother; Diabetes in her father and sister; Heart disease in her father and mother; Hyperlipidemia in her father; Hypertension in her father and mother; Other in her father; Stroke in her father. There is no history of Colon cancer, Pancreatic cancer, Stomach cancer, Esophageal cancer, or Liver disease.   Allergies Allergies  Allergen Reactions   Statins Other (See Comments)    Leg aches, Myalgias   Codeine Nausea And Vomiting   Demerol [Meperidine] Nausea And Vomiting and Other (See Comments)    Violent behavior/reaction   Diltiazem Other (See Comments)    Pt does not remember reaction    Metformin Other (See Comments)    May blood sugar drop     Home Medications  Prior to Admission medications   Medication Sig Start Date End Date Taking? Authorizing Provider  acetaminophen (TYLENOL) 500 MG tablet Take 500-1,000 mg by mouth every 6 (six) hours as needed for mild pain  or moderate pain.    [provider]  allopurinol (ZYLOPRIM) 300 MG tablet Take 300 mg by mouth daily in the afternoon. midday    [provider]  amLODipine (NORVASC) 5 MG tablet Take 5 mg by mouth daily in the afternoon. midday 03/27/17   [provider]  Apoaequorin (PREVAGEN) 10 MG CAPS Take 10 mg by mouth daily.    [provider]  aspirin EC 81 MG tablet Take 81 mg by mouth at bedtime.    [provider]  carboxymethylcellulose (REFRESH PLUS) 0.5 % SOLN Place 1 drop into both eyes 2 (two) times daily as needed (dry eyes).    [provider]  clopidogrel (PLAVIX) 75 MG tablet Take 1 tablet (75 mg total) by mouth daily. 09/25/21 09/25/22  Waynetta Sandy, MD  Dulaglutide (TRULICITY) 1.5 ME/2.6ST SOPN Inject 1.5 mg into the skin every Saturday.    [provider]  fexofenadine (ALLEGRA) 180 MG tablet Take 180 mg by mouth daily as needed for allergies or rhinitis.    [provider]  folic acid (FOLVITE) 1 MG tablet Take 1 mg by mouth in the morning and at bedtime. 05/08/19   [provider]  glimepiride (AMARYL) 1 MG tablet Take 1 mg by mouth daily as needed (blood sugar greater 150.). 03/18/19   [provider]  levothyroxine (SYNTHROID) 88 MCG tablet Take 88 mcg by mouth daily before breakfast.    [provider]  LORazepam (ATIVAN) 0.5 MG tablet Take 0.5 mg by mouth 2 (two) times daily as needed (panic attacks). 04/08/20   [provider]  losartan (COZAAR) 100 MG tablet Take 100 mg by mouth at bedtime. 02/28/17   [provider]  metoprolol tartrate (LOPRESSOR) 25 MG tablet Take 12.5 mg by mouth 2 (two) times daily.    [provider]  Multiple Vitamins-Minerals (MULTIVITAMIN PO) Take 1 tablet by mouth daily.     [provider]  naproxen sodium (ALEVE) 220 MG tablet Take 220 mg by mouth daily as needed (pain).    [provider]  omeprazole  (PRILOSEC) 20 MG capsule Take 20 mg by mouth daily in the afternoon. midday    [provider]  PERCOCET 5-325 MG tablet SMARTSIG:1 Each By Mouth Every 4 Hours 10/20/21   [provider]  pramipexole (MIRAPEX) 0.5 MG tablet Take 0.5 mg by mouth at bedtime. 07/18/21   [provider]     Critical care time: 45 minutes  JD Rexene Agent [facility] 01/15/2022, 8:21 PM  Please see [email or web address] for pager details.  From 7A-7P if no response, please call [phone]. After hours, please call ELink [phone].

## 2022-01-13 NOTE — ED Triage Notes (Addendum)
PT arrived via [facility] for initial complaint of shortness of breath. EMS called to residence for respiratory distress to find patient tripoding, tachypnic, hypertensive, RA sat 70%, warm, and able to answer questions. Pt placed on NR, transferred to ambulance and transitioned to CPAP. Approximately 5 minutes from [facility] ED, pt lost consciousness, cool and diaphoretic,and minimally responsive to painful stimuli. Pt then vomited into CPAP. CPAP removed, Pt placed on NR. At time of arrival, PT GCS 3, EMS report faint carotid pulse. Pt transferred to hospital bed and compressions started at [place].

## 2022-01-13 NOTE — Progress Notes (Signed)
Pharmacy Antibiotic Note  CAELYNN MARSHMAN is a 76 y.o. female for which pharmacy has been consulted for zosyn dosing for  asp pneumonia .  Patient with a history of anxiety, aortic stenosis, carotid artery occlusion and bilateral stenosis, cerebrovascular disease, depression, T2DM, HTN, GERD, gout, HLD, hypothyroidism, OA, TIA. Patient presenting with cardiac arrest.  SCr 1.5 - 1.4 in March WBC pending; LA pending; T 97.1 F  On vasopressors  Plan: Zosyn 3.375g IV q8h (4 hour infusion) Trend WBC, Fever, Renal function, & Clinical course F/u cultures, clinical course, WBC, fever De-escalate when able  Height: '5\' 3"'$  (160 cm) Weight: 78.8 kg (173 lb 11.6 oz) IBW/kg (Calculated) : 52.4  Temp (24hrs), Avg:97.1 F (36.2 C), Min:97.1 F (36.2 C), Max:97.1 F (36.2 C)  Recent Labs  Lab 02/07/2022 2009  CREATININE 1.50*     Estimated Creatinine Clearance: 31.7 mL/min (A) (by C-G formula based on SCr of 1.5 mg/dL (H)).    Allergies  Allergen Reactions   Statins Other (See Comments)    Leg aches, Myalgias   Codeine Nausea And Vomiting   Demerol [Meperidine] Nausea And Vomiting and Other (See Comments)    Violent behavior/reaction   Diltiazem Other (See Comments)    Pt does not remember reaction    Metformin Other (See Comments)    May blood sugar drop    Antimicrobials this admission: zosyn 7/1 >>  Microbiology results: Pending  Thank you for allowing pharmacy to be a part of this patient's care.  Lorelei Pont, PharmD, BCPS 01/17/2022 9:27 PM ED Clinical Pharmacist -  [phone]

## 2022-01-13 NOTE — Progress Notes (Signed)
Pt transported to CT then to 2H06 with RN at bedside. Report given. No complications at this time. RT will continue to monitor.

## 2022-01-13 NOTE — Procedures (Signed)
Bronchoscopy Procedure Note  RENATTA SHRIEVES  [id number]  1946-04-30  Date:01/21/2022  Time:11:57 PM   Provider Performing:Vayla Wilhelmi   Procedure(s):  Flexible bronchoscopy with bronchial alveolar lavage (09233)  Indication(s) Acute respiratory failure  Consent Risks of the procedure as well as the alternatives and risks of each were explained to the patient and/or caregiver.  Consent for the procedure was obtained and is signed in the bedside chart  Anesthesia Propofol   Time Out Verified patient identification, verified procedure, site/side was marked, verified correct patient position, special equipment/implants available, medications/allergies/relevant history reviewed, required imaging and test results available.   Sterile Technique Usual hand hygiene, masks, gowns, and gloves were used   Procedure Description Bronchoscope advanced through endotracheal tube and into airway.  Airways were examined down to subsegmental level with findings noted below.   Following diagnostic evaluation, BAL(s) performed in RLL with normal saline and return of clear fluid  Findings: Inflamed mucosa noted through out respiratory tree   Complications/Tolerance None; patient tolerated the procedure well. Chest X-ray is needed post procedure.   EBL Minimal   Specimen(s) BAL

## 2022-01-13 NOTE — IPAL (Signed)
  Interdisciplinary Goals of Care Family Meeting   Date carried out: 02/05/2022  Location of the meeting: Conference room  Member's involved: Physician, Family Member or next of kin, and Other: PA-C  Durable Power of [employer] or acting medical decision maker: Son Alroy Dust    Discussion: We discussed goals of care for [employer] .  Spoke with 2 sons and their spouses in waiting room. Updated that patient neuro prognosis is poor with patient having myoclonic jerking likely from anoxia from cardiac arrest. Asked about what to do if her heart stops again. Family in agreement to change patient to DNR. Family would like for [place] to continue current care for now.  Code status: Full DNR  Disposition: Continue current acute care  Time spent for the meeting: 35 minutes    Mick Sell, PA-C  01/26/2022, 11:30 PM

## 2022-01-13 NOTE — Progress Notes (Signed)
eLink Physician-Brief Progress Note Patient Name: Meredith Young DOB: [birth year] MRN: [id number]   Date of Service  02/10/2022  HPI/Events of Note  76/F with history of hypertension, diabetes, who presents to the ED following cardiac arrest. CPR had been conducted. ROSC obtained after around 20 minutes.   eICU Interventions  - Admit to ICU - Maintain on vent support.       - Low tidal volume ventilation strategy.       - Titrate FIO2 and PEEP to target SPO2> 90% - Serial neurochecks - Monitor electrolytes, correct abnormalities as warranted.  - Maintain normoglycemia - Started on empiric antibiotics for aspiration pneumonia. Will follow cultures and deescalate as warranted.  - Trend WBC, lactate, temperature curve.  - Will address fever agressively, if any were to arise.          [place] 02/10/2022, 11:17 PM

## 2022-01-13 NOTE — ED Notes (Signed)
9141 Compressions started 1943 Pulse Check - Asystole 1943 Epi #1 1944 20g Right FA 1945 Pulse Check - Asystole 1946 Epi #2 1948 Pulse Check - Asystole  1949 Bicarb 1950 Pulse Check - Asystole 1950 Epi #3 1952 Pulse Check - Asystole 1953 Epi #4 1954 Pulse Check - ROSC tachy, organized rhythm  1955 PEA, Compressions restarted 1955 Epi drip stated at 5mg 1957 Pulse Check - ROSC tachy, organized rhythm  1959 PEA, Compressions restarted 2000 Intubation 7.5, 23 at the lip, + Color change 2000 Epi #5 2002 Pulse Check - PEA 2003 Epi #6 2003 Sodium Bicarb #2 2003 20g L FA 2004 Pulse Check - ROSC tachy, organized rhythm 2009 16 OG placed

## 2022-01-13 NOTE — Progress Notes (Signed)
   02/01/2022 2108  Adult Ventilator Settings  Set Rate (S)  32 bmp  FiO2 (%) (S)  50 %  I Time (S)  0.8 Sec(s)    Vent changes made per MD based on ABG results.

## 2022-01-14 ENCOUNTER — Inpatient Hospital Stay (HOSPITAL_COMMUNITY): Payer: PPO

## 2022-01-14 DIAGNOSIS — R579 Shock, unspecified: Secondary | ICD-10-CM

## 2022-01-14 DIAGNOSIS — I469 Cardiac arrest, cause unspecified: Secondary | ICD-10-CM

## 2022-01-14 DIAGNOSIS — G931 Anoxic brain damage, not elsewhere classified: Secondary | ICD-10-CM | POA: Diagnosis not present

## 2022-01-14 DIAGNOSIS — G40409 Other generalized epilepsy and epileptic syndromes, not intractable, without status epilepticus: Secondary | ICD-10-CM

## 2022-01-14 DIAGNOSIS — Z7189 Other specified counseling: Secondary | ICD-10-CM

## 2022-01-14 LAB — CBC
HCT: 33.5 % — ABNORMAL LOW (ref 36.0–46.0)
Hemoglobin: 10.2 g/dL — ABNORMAL LOW (ref 12.0–15.0)
MCH: 27.3 pg (ref 26.0–34.0)
MCHC: 30.4 g/dL (ref 30.0–36.0)
MCV: 89.8 fL (ref 80.0–100.0)
Platelets: 221 K/uL (ref 150–400)
RBC: 3.73 MIL/uL — ABNORMAL LOW (ref 3.87–5.11)
RDW: 15.9 % — ABNORMAL HIGH (ref 11.5–15.5)
WBC: 18 K/uL — ABNORMAL HIGH (ref 4.0–10.5)
nRBC: 0.1 % (ref 0.0–0.2)

## 2022-01-14 LAB — POCT I-STAT 7, (LYTES, BLD GAS, ICA,H+H)
Acid-base deficit: 12 mmol/L — ABNORMAL HIGH (ref 0.0–2.0)
Bicarbonate: 14.3 mmol/L — ABNORMAL LOW (ref 20.0–28.0)
Calcium, Ion: 1.1 mmol/L — ABNORMAL LOW (ref 1.15–1.40)
HCT: 32 % — ABNORMAL LOW (ref 36.0–46.0)
Hemoglobin: 10.9 g/dL — ABNORMAL LOW (ref 12.0–15.0)
O2 Saturation: 93 %
Patient temperature: 36.5
Potassium: 4.5 mmol/L (ref 3.5–5.1)
Sodium: 135 mmol/L (ref 135–145)
TCO2: 15 mmol/L — ABNORMAL LOW (ref 22–32)
pCO2 arterial: 30.8 mmHg — ABNORMAL LOW (ref 32–48)
pH, Arterial: 7.272 — ABNORMAL LOW (ref 7.35–7.45)
pO2, Arterial: 74 mmHg — ABNORMAL LOW (ref 83–108)

## 2022-01-14 LAB — ECHOCARDIOGRAM COMPLETE
AR max vel: 0.58 cm2
AV Area VTI: 0.63 cm2
AV Area mean vel: 0.68 cm2
AV Mean grad: 44.5 mmHg
AV Peak grad: 82.1 mmHg
Ao pk vel: 4.53 m/s
Area-P 1/2: 2.31 cm2
Height: 63 in
S' Lateral: 2.3 cm
Weight: 2818.36 [oz_av]

## 2022-01-14 LAB — COMPREHENSIVE METABOLIC PANEL WITH GFR
ALT: 92 U/L — ABNORMAL HIGH (ref 0–44)
AST: 200 U/L — ABNORMAL HIGH (ref 15–41)
Albumin: 2.5 g/dL — ABNORMAL LOW (ref 3.5–5.0)
Alkaline Phosphatase: 76 U/L (ref 38–126)
Anion gap: 19 — ABNORMAL HIGH (ref 5–15)
BUN: 41 mg/dL — ABNORMAL HIGH (ref 8–23)
CO2: 12 mmol/L — ABNORMAL LOW (ref 22–32)
Calcium: 7.6 mg/dL — ABNORMAL LOW (ref 8.9–10.3)
Chloride: 107 mmol/L (ref 98–111)
Creatinine, Ser: 2.69 mg/dL — ABNORMAL HIGH (ref 0.44–1.00)
GFR, Estimated: 18 mL/min — ABNORMAL LOW
Glucose, Bld: 359 mg/dL — ABNORMAL HIGH (ref 70–99)
Potassium: 4.1 mmol/L (ref 3.5–5.1)
Sodium: 138 mmol/L (ref 135–145)
Total Bilirubin: 0.6 mg/dL (ref 0.3–1.2)
Total Protein: 4.9 g/dL — ABNORMAL LOW (ref 6.5–8.1)

## 2022-01-14 LAB — GLUCOSE, CAPILLARY
Glucose-Capillary: 435 mg/dL — ABNORMAL HIGH (ref 70–99)
Glucose-Capillary: 407 mg/dL — ABNORMAL HIGH (ref 70–99)
Glucose-Capillary: 334 mg/dL — ABNORMAL HIGH (ref 70–99)
Glucose-Capillary: 275 mg/dL — ABNORMAL HIGH (ref 70–99)
Glucose-Capillary: 246 mg/dL — ABNORMAL HIGH (ref 70–99)
Glucose-Capillary: 238 mg/dL — ABNORMAL HIGH (ref 70–99)
Glucose-Capillary: 239 mg/dL — ABNORMAL HIGH (ref 70–99)
Glucose-Capillary: 233 mg/dL — ABNORMAL HIGH (ref 70–99)

## 2022-01-14 LAB — HEPARIN LEVEL (UNFRACTIONATED): Heparin Unfractionated: 0.37 IU/mL (ref 0.30–0.70)

## 2022-01-14 LAB — LACTIC ACID, PLASMA
Lactic Acid, Venous: 7.2 mmol/L (ref 0.5–1.9)
Lactic Acid, Venous: >9 mmol/L (ref 0.5–1.9)

## 2022-01-14 LAB — MAGNESIUM: Magnesium: 2 mg/dL (ref 1.7–2.4)

## 2022-01-14 LAB — HEMOGLOBIN A1C
Hgb A1c MFr Bld: 6.1 % — ABNORMAL HIGH (ref 4.8–5.6)
Mean Plasma Glucose: 128.37 mg/dL

## 2022-01-14 LAB — MRSA NEXT GEN BY PCR, NASAL: MRSA by PCR Next Gen: NOT DETECTED

## 2022-01-14 LAB — TRIGLYCERIDES: Triglycerides: 109 mg/dL (ref <150)

## 2022-01-14 MED ORDER — SODIUM CHLORIDE 0.9 % IV SOLN: INTRAVENOUS | Status: DC

## 2022-01-14 MED ORDER — DEXTROSE 5 % IV SOLN
1500.0000 mg | Freq: Once | INTRAVENOUS | Status: AC
  Administered 2022-01-14: 1500 mg via INTRAVENOUS
  Filled 2022-01-14: qty 15

## 2022-01-14 MED ORDER — MORPHINE BOLUS VIA INFUSION: 5.0000 mg | INTRAVENOUS | Status: DC | PRN

## 2022-01-14 MED ORDER — PROPOFOL BOLUS VIA INFUSION
2.0000 mg/kg | Freq: Once | INTRAVENOUS | Status: AC
  Administered 2022-01-14: 157.6 mg via INTRAVENOUS
  Filled 2022-01-14: qty 158

## 2022-01-14 MED ORDER — SODIUM CHLORIDE 0.9 % IV SOLN
4000.0000 mg | Freq: Once | INTRAVENOUS | Status: AC
  Administered 2022-01-14: 4000 mg via INTRAVENOUS
  Filled 2022-01-14: qty 40

## 2022-01-14 MED ORDER — INSULIN ASPART 100 UNIT/ML IJ SOLN
20.0000 [IU] | Freq: Once | INTRAMUSCULAR | Status: AC
Start: 2022-01-14 — End: 2022-01-14
  Administered 2022-01-14: 20 [IU] via SUBCUTANEOUS

## 2022-01-14 MED ORDER — GLYCOPYRROLATE 1 MG PO TABS: 1.0000 mg | ORAL_TABLET | ORAL | Status: DC | PRN

## 2022-01-14 MED ORDER — ACETAMINOPHEN 650 MG RE SUPP: 650.0000 mg | Freq: Four times a day (QID) | RECTAL | Status: DC | PRN

## 2022-01-14 MED ORDER — SODIUM CHLORIDE 0.9 % IV BOLUS
500.0000 mL | Freq: Once | INTRAVENOUS | Status: AC
  Administered 2022-01-14: 500 mL via INTRAVENOUS

## 2022-01-14 MED ORDER — GLYCOPYRROLATE 0.2 MG/ML IJ SOLN: 0.2000 mg | INTRAMUSCULAR | Status: DC | PRN

## 2022-01-14 MED ORDER — MIDAZOLAM HCL 2 MG/2ML IJ SOLN: 2.0000 mg | INTRAMUSCULAR | Status: DC | PRN

## 2022-01-14 MED ORDER — ACETAMINOPHEN 325 MG PO TABS: 650.0000 mg | ORAL_TABLET | Freq: Four times a day (QID) | ORAL | Status: DC | PRN

## 2022-01-14 MED ORDER — DEXTROSE 50 % IV SOLN: 0.0000 mL | INTRAVENOUS | Status: DC | PRN

## 2022-01-14 MED ORDER — LEVETIRACETAM IN NACL 1000 MG/100ML IV SOLN
1000.0000 mg | Freq: Two times a day (BID) | INTRAVENOUS | Status: DC
  Administered 2022-01-14: 1000 mg via INTRAVENOUS
  Filled 2022-01-14 (×2): qty 100

## 2022-01-14 MED ORDER — SODIUM CHLORIDE 0.9 % IV SOLN
75.0000 mL/h | INTRAVENOUS | Status: DC
Start: 2022-01-14 — End: 2022-01-14

## 2022-01-14 MED ORDER — VASOPRESSIN 20 UNITS/100 ML INFUSION FOR SHOCK
0.0000 [IU]/min | INTRAVENOUS | Status: DC
  Administered 2022-01-14 (×2): 0.03 [IU]/min via INTRAVENOUS
  Filled 2022-01-14 (×2): qty 100

## 2022-01-14 MED ORDER — PIPERACILLIN-TAZOBACTAM IN DEX 2-0.25 GM/50ML IV SOLN
2.2500 g | Freq: Three times a day (TID) | INTRAVENOUS | Status: DC
Start: 2022-01-14 — End: 2022-01-14
  Administered 2022-01-14: 2.25 g via INTRAVENOUS
  Filled 2022-01-14 (×2): qty 50

## 2022-01-14 MED ORDER — MORPHINE 100MG IN NS 100ML (1MG/ML) PREMIX INFUSION
0.0000 mg/h | INTRAVENOUS | Status: DC
  Administered 2022-01-14: 5 mg/h via INTRAVENOUS
  Filled 2022-01-14: qty 100

## 2022-01-14 MED ORDER — INSULIN REGULAR(HUMAN) IN NACL 100-0.9 UT/100ML-% IV SOLN
INTRAVENOUS | Status: DC
  Administered 2022-01-14: 11.5 [IU]/h via INTRAVENOUS
  Filled 2022-01-14: qty 100

## 2022-01-14 MED ORDER — PROPOFOL 1000 MG/100ML IV EMUL
20.0000 ug/kg/min | INTRAVENOUS | Status: DC
  Administered 2022-01-14 (×2): 20 ug/kg/min via INTRAVENOUS
  Filled 2022-01-14 (×2): qty 100

## 2022-01-14 MED ORDER — POLYVINYL ALCOHOL 1.4 % OP SOLN: 1.0000 [drp] | Freq: Four times a day (QID) | OPHTHALMIC | Status: DC | PRN

## 2022-01-16 LAB — CULTURE, RESPIRATORY W GRAM STAIN
Culture: NORMAL
Gram Stain: NONE SEEN

## 2022-01-19 LAB — CULTURE, BLOOD (ROUTINE X 2)
Culture: NO GROWTH
Culture: NO GROWTH
Special Requests: ADEQUATE
Special Requests: ADEQUATE

## 2022-01-22 ENCOUNTER — Ambulatory Visit: Payer: No Typology Code available for payment source | Admitting: Podiatry

## 2022-01-22 ENCOUNTER — Ambulatory Visit: Payer: Medicare Other | Admitting: Cardiology

## 2022-01-30 ENCOUNTER — Ambulatory Visit: Payer: No Typology Code available for payment source | Admitting: Podiatrist

## 2022-02-13 NOTE — Death Summary Note (Signed)
DEATH SUMMARY   Patient Details  Name: Meredith Young MRN: [id number] DOB: Apr 15, 1946  Admission/Discharge Information   Admit Date:  01/16/22  Date of Death: Date of Death: January 17, 2022  Time of Death: Time of Death: 53  Length of Stay: 1  Referring Physician: Nicoletta Dress, MD   Reason(s) for Hospitalization  Patient is a 76 year old female with pertinent PMH of HTN, HLD, aortic stenosis, PAD, carotid stenosis, hypothyroidism, DMT2, TIA presents to [facility] ED on January 17, 2023 with cardiac arrest. On 17-Jan-2023 patient was experiencing SOB and HTN.  [facility] called and was transferring patient to [facility].  In route patient was placed on CPAP for WOB and tripod breathing.  Sats were 70% on RA.  Right before patient arrived to [facility] ED, patient lost consciousness and vomited and CPAP mask.  Patient placed on NRB.  Faint carotid pulse present and GCS 3.  Patient arrived to [facility] ED.  Once patient moved onto bed patient had no pulse and CPR initiated.  Rhythm initially asystole and first pulse was received after 13 minutes.  Patient lost pulse and ROSC was quickly obtained a few more times.  Patient intubated.  Total ROSC 20 minutes.  Received 5 of epi and 2 bicarb.  Patient remained unresponsive.  Started on epi drip. Labs pending. PCCM consulted for ICU admission.  Diagnoses  Preliminary cause of death:  Secondary Diagnoses (including complications and co-morbidities):  Principal Problem:   Cardiac arrest [facility]) Active Problems:   Goals of care, counseling/discussion   Brief Hospital Course (including significant findings, care, treatment, and services provided and events leading to death)  Meredith Young is a 76 y.o. year old female who PMH of HTN, HLD, aortic stenosis, PAD, carotid stenosis, hypothyroidism, DMT2, TIA presents to [facility] ED on 2023-01-17 with cardiac arrest. On 01/17/23 patient was experiencing SOB and HTN.  [facility] called and was transferring patient to [facility].  In route patient was placed on CPAP for  WOB and tripod breathing.  Sats were 70% on RA.  Right before patient arrived to [facility] ED, patient lost consciousness and vomited and CPAP mask.  Patient placed on NRB.  Faint carotid pulse present and GCS 3.  Patient arrived to [facility] ED.  Once patient moved onto bed patient had no pulse and CPR initiated.  Rhythm initially asystole and first pulse was received after 13 minutes.  Patient lost pulse and ROSC was quickly obtained a few more times.  Patient intubated.  Total ROSC 20 minutes.  Received 5 of epi and 2 bicarb.  Patient remained unresponsive.  Started on epi drip. Labs pending. PCCM consulted for ICU admission.  Cardiac Arrest: Likely respiratory related; total ROSC 20 minutes P: Supportive post arrest care On multiple vasopressors  Making no urine  Ongoing seizures Prognosis is poor  Normothermic protocol    Acute respiratory failure with hypercarbia Possible aspiration pneumonia and pulmonary edema P: LTVV  VAP ppx  Mental status precludes liberation attempts at this time    Acute metabolic encephalopathy, secondary to post arrest  Myoclonic seizures, severe anoxic brain injury  P: Supportive care  On propofol  Appreciate neuro recommendations    Hyperglycemia DMT2 P: SSI    Anemia P: -trend CBC   Moderate Aortic stenosis HTN/HLD P: Holding hold anti-hypertensive meds    Carotid stenosis s/p R endarterectomy TIA: 2017 P: ASA + Plavix    Hx of hypothyroidism P: Continue synthroid    Hx of depression/anxiety P: Home ativan    PAD:  09/2021 R common iliac artery stent placed P: ASA + plavix    GERD P: PPI    RLS P: Mirapex    Gout P: Holding allopurinol    Goals of care:  We will need to address goals of care with patients family.    We met with the patients family. They were very clear that the patient would not want mechanical life support. And with the fact that she has ongoing seizures post arrest we discussed her poor prognosis. They are  made the decision for comfort care transition.   Pertinent Labs and Studies  Significant Diagnostic Studies ECHOCARDIOGRAM COMPLETE  Result Date: 01-30-2022    ECHOCARDIOGRAM REPORT   Patient Name:   Meredith Young Date of Exam: 01/30/22 Medical Rec #:  [id number]        Height:       63.0 in Accession #:    [id number]       Weight:       176.1 lb Date of Birth:  1946-02-10         BSA:          1.832 m Patient Age:    66 years         BP:           123/45 mmHg Patient Gender: F                HR:           81 bpm. Exam Location:  Inpatient Procedure: 2D Echo, Color Doppler and Cardiac Doppler Indications:    Cardiac Arrest i46.9  History:        Patient has prior history of Echocardiogram examinations, most                 recent 12/20/2020. Risk Factors:Hypertension, Diabetes and                 Dyslipidemia.  Sonographer:    Raquel Sarna Senior RDCS Referring Phys: [id number] Mick Sell  Sonographer Comments: Scanned supine on artificial respirator. IMPRESSIONS  1. Left ventricular ejection fraction, by estimation, is 60 to 65%. The left ventricle has normal function. The left ventricle demonstrates regional wall motion abnormalities (see scoring diagram/findings for description). There is mild left ventricular  hypertrophy. Left ventricular diastolic parameters are consistent with Grade I diastolic dysfunction (impaired relaxation).  2. RV-RA gradient 22 mmHg suggests normal RVSP presuming normal CVP. Apical RV hypokinesis noted. Right ventricular systolic function is normal. The right ventricular size is normal.  3. The mitral valve is degenerative. Trivial mitral valve regurgitation. Moderate to severe mitral annular calcification.  4. The aortic valve has an indeterminant number of cusps. There is moderate calcification of the aortic valve. Aortic valve regurgitation is not visualized. Severe aortic valve stenosis. Aortic valve mean gradient measures 44.5 mmHg. Aortic valve Vmax measures 4.53 m/s. Dimentionless  index 0.22.  5. Unable to estimate CVP. Comparison(s): Prior images reviewed side by side. LVEF normal range at 60-65% with small region on apical LV and RV hypokinesis. Aortic valve not well visualized, but aortic stenosis is severe range at this point. FINDINGS  Left Ventricle: Left ventricular ejection fraction, by estimation, is 60 to 65%. The left ventricle has normal function. The left ventricle demonstrates regional wall motion abnormalities. The left ventricular internal cavity size was normal in size. There is mild left ventricular hypertrophy. Left ventricular diastolic parameters are consistent with Grade I diastolic dysfunction (impaired relaxation).  LV Wall Scoring: The apical  septal segment and apex are hypokinetic. The entire anterior wall, entire lateral wall, anterior septum, entire inferior wall, mid inferoseptal segment, and basal inferoseptal segment are normal. Right Ventricle: RV-RA gradient 22 mmHg suggests normal RVSP presuming normal CVP. Apical RV hypokinesis noted. The right ventricular size is normal. No increase in right ventricular wall thickness. Right ventricular systolic function is normal. Left Atrium: Left atrial size was normal in size. Right Atrium: Right atrial size was normal in size. Pericardium: There is no evidence of pericardial effusion. Mitral Valve: The mitral valve is degenerative in appearance. Moderate to severe mitral annular calcification. Trivial mitral valve regurgitation. Tricuspid Valve: The tricuspid valve is grossly normal. Tricuspid valve regurgitation is trivial. Aortic Valve: The aortic valve has an indeterminant number of cusps. There is moderate calcification of the aortic valve. Aortic valve regurgitation is not visualized. Severe aortic stenosis is present. Aortic valve mean gradient measures 44.5 mmHg. Aortic valve peak gradient measures 82.1 mmHg. Aortic valve area, by VTI measures 0.63 cm. Pulmonic Valve: The pulmonic valve was grossly normal.  Pulmonic valve regurgitation is trivial. Aorta: The aortic root is normal in size and structure. Venous: Unable to estimate CVP. IVC assessment for right atrial pressure unable to be performed due to mechanical ventilation. IAS/Shunts: No atrial level shunt detected by color flow Doppler.  LEFT VENTRICLE PLAX 2D LVIDd:         3.80 cm   Diastology LVIDs:         2.30 cm   LV e' medial:    3.59 cm/s LV PW:         1.10 cm   LV E/e' medial:  25.2 LV IVS:        1.30 cm   LV e' lateral:   4.46 cm/s LVOT diam:     1.90 cm   LV E/e' lateral: 20.3 LV SV:         50 LV SV Index:   28 LVOT Area:     2.84 cm  RIGHT VENTRICLE RV S prime:     12.70 cm/s TAPSE (M-mode): 1.7 cm LEFT ATRIUM             Index        RIGHT ATRIUM          Index LA diam:        3.30 cm 1.80 cm/m   RA Area:     8.97 cm LA Vol (A2C):   31.9 ml 17.41 ml/m  RA Volume:   17.40 ml 9.50 ml/m LA Vol (A4C):   21.1 ml 11.52 ml/m LA Biplane Vol: 25.5 ml 13.92 ml/m  AORTIC VALVE AV Area (Vmax):    0.58 cm AV Area (Vmean):   0.68 cm AV Area (VTI):     0.63 cm AV Vmax:           453.00 cm/s AV Vmean:          310.500 cm/s AV VTI:            0.805 m AV Peak Grad:      82.1 mmHg AV Mean Grad:      44.5 mmHg LVOT Vmax:         93.30 cm/s LVOT Vmean:        74.900 cm/s LVOT VTI:          0.178 m LVOT/AV VTI ratio: 0.22  AORTA Ao Root diam: 2.70 cm Ao Asc diam:  2.30 cm MITRAL VALVE  TRICUSPID VALVE MV Area (PHT): 2.31 cm     TR Peak grad:   21.9 mmHg MV Decel Time: 329 msec     TR Vmax:        234.00 cm/s MV E velocity: 90.60 cm/s MV A velocity: 135.00 cm/s  SHUNTS MV E/A ratio:  0.67         Systemic VTI:  0.18 m                             Systemic Diam: 1.90 cm Rozann Lesches MD Electronically signed by Rozann Lesches MD Signature Date/Time: Feb 11, 2022/10:34:11 AM    Final    DG Chest Port 1 View  Result Date: Feb 11, 2022 CLINICAL DATA:  Acute respiratory failure.  Post cardiac arrest. EXAM: PORTABLE CHEST 1 VIEW COMPARISON:  Radiographs  02/11/2022 and 01/18/2022. FINDINGS: 0518 hours. Patient rotation to the left. The endotracheal tube, enteric tube and left subclavian central venous catheter appear unchanged. The heart size and mediastinal contours are stable. Stable left greater than right basilar airspace opacities. The diffuse interstitial opacities present in both lungs have improved from yesterday. There may be a small amount of pleural fluid on the left. No evidence of pneumothorax or acute osseous abnormality. Multiple telemetry leads overlie the chest. IMPRESSION: 1. Suspected mild improvement in pulmonary edema since yesterday. 2. Otherwise stable chest with asymmetric left basilar airspace disease. 3. Stable support system. Electronically Signed   By: Richardean Sale M.D.   On: 02/11/22 09:08   Overnight EEG with video  Result Date: 2022/02/11 Lora Havens, MD     11-Feb-2022  5:20 PM Patient Name: Meredith Young MRN: [id number] Epilepsy Attending: Lora Havens Referring Physician/Provider: Lora Havens, MD Duration: 02/11/2022 0033 to  1114  Patient history: 76yo F s/p cardiac arrest. EEG to evaluate for seizure  Level of alertness: comatose  AEDs during EEG study: LEV, Propofol  Technical aspects: This EEG study was done with scalp electrodes positioned according to the 10-20 International system of electrode placement. Electrical activity was acquired at a sampling rate of 500Hz and reviewed with a high frequency filter of 70Hz and a low frequency filter of 1Hz. EEG data were recorded continuously and digitally stored.  Description: Patient was noted to have episodes of whole body sudden and brief jerking as well as eye opening every 15-30 seconds. Concomitant eeg showed generalized polyspikes consistent with myoclonic seizure. In between seizures, EEG showed generalized attenuation. Hyperventilation and photic stimulation were not performed.    ABNORMALITY  Myoclonic seizure, generalized -Background attenuation,  generalized  IMPRESSION: This study showed myoclonic seizures every 15-30 seconds.There was also profound diffuse encephalopathy. In the setting of cardiac arrest, this is most likely suggestive of anoxic-hypoxic brain injury.  Lora Havens    EEG adult  Result Date: 11-Feb-2022 Lora Havens, MD     11-Feb-2022 12:38 AM Patient Name: Meredith Young MRN: [id number] Epilepsy Attending: Lora Havens Referring Physician/Provider: Mick Sell, PA-C Date: 11-Feb-2022 Duration: 26.38 mins Patient history: 76yo F s/p cardiac arrest. EEG to evaluate for seizure Level of alertness: comatose AEDs during EEG study: None Technical aspects: This EEG study was done with scalp electrodes positioned according to the 10-20 International system of electrode placement. Electrical activity was acquired at a sampling rate of 500Hz and reviewed with a high frequency filter of 70Hz and a low frequency filter of 1Hz. EEG data were recorded continuously and digitally stored.  Description: Patient was noted to have episodes of whole body sudden and brief jerking as well as eye opening every 1 second to 1 minute. Concomitant eeg showed generalized polyspikes consistent with myoclonic seizure. In between seizure, EEG showed generalized suppression. Hyperventilation and photic stimulation were not performed.   ABNORMALITY  Myoclonic seizure, generalized -Background suppression, generalized IMPRESSION: This study showed myoclonic seizures every 1 second to 1 minute. There is also profound diffuse encephalopathy. In the setting of cardiac arrest, this is most likely suggestive of anoxic-hypoxic brain injury. Dr Tacy Learn and Dr Rory Percy were notified. Priyanka Barbra Sarks   DG CHEST PORT 1 VIEW  Result Date: 2022/02/03 CLINICAL DATA:  Check central line placement EXAM: PORTABLE CHEST 1 VIEW COMPARISON:  Film from the previous day. FINDINGS: Cardiac shadow is stable. Endotracheal tube and gastric catheter are in stable appearance. New left  subclavian central line is noted with catheter tip at the cavoatrial junction. No pneumothorax is seen. Diffuse vascular congestion is again noted and stable. IMPRESSION: Tubes and lines in satisfactory position. No pneumothorax following recent left-sided subclavian central line placement. The remainder of the exam is stable from the prior study. Electronically Signed   By: Inez Catalina M.D.   On: Feb 03, 2022 00:23   CT HEAD WO CONTRAST (5MM)  Result Date: 02/07/2022 CLINICAL DATA:  Hypertensive with respiratory distress. EXAM: CT HEAD WITHOUT CONTRAST TECHNIQUE: Contiguous axial images were obtained from the base of the skull through the vertex without intravenous contrast. RADIATION DOSE REDUCTION: This exam was performed according to the departmental dose-optimization program which includes automated exposure control, adjustment of the mA and/or kV according to patient size and/or use of iterative reconstruction technique. COMPARISON:  May 04, 2019 FINDINGS: Brain: There is mild cerebral atrophy with widening of the extra-axial spaces and ventricular dilatation. There are areas of decreased attenuation within the white matter tracts of the supratentorial brain, consistent with microvascular disease changes. Chronic bilateral basal ganglia lacunar infarcts are noted. Vascular: No hyperdense vessel or unexpected calcification. Skull: Normal. Negative for fracture or focal lesion. Sinuses/Orbits: There is a moderate sized right maxillary sinus air-fluid level. Small left maxillary sinus and sphenoid sinus air-fluid levels are also seen. There is moderate severity bilateral ethmoid sinus mucosal thickening. Other: None. IMPRESSION: 1. No acute intracranial abnormality. 2. Generalized cerebral atrophy with chronic white matter small vessel ischemic changes. 3. Pansinus disease. Electronically Signed   By: Virgina Norfolk M.D.   On: 01/20/2022 22:45   DG Abdomen 1 View  Result Date: 02/07/2022 CLINICAL  DATA:  Check gastric catheter placement EXAM: ABDOMEN - 1 VIEW COMPARISON:  None Available. FINDINGS: Gastric catheter is noted within the distal stomach. No free air is seen. No obstructive changes are noted. IMPRESSION: Gastric catheter within the stomach. Electronically Signed   By: Inez Catalina M.D.   On: 01/24/2022 20:35   DG Chest Portable 1 View  Result Date: 02/08/2022 CLINICAL DATA:  Check endotracheal tube, recent cardiac arrest EXAM: PORTABLE CHEST 1 VIEW COMPARISON:  05/13/2019 FINDINGS: Cardiac shadow is within normal limits. Aortic calcifications are noted. Diffuse vascular congestion and parenchymal opacities are noted most consistent with pulmonary edema. Endotracheal tube is noted in satisfactory position. Gastric catheter is coiled within the stomach. No bony abnormality is seen. IMPRESSION: Tubes and lines as described. Vascular congestion and parenchymal opacities most consistent with pulmonary edema. Electronically Signed   By: Inez Catalina M.D.   On: 01/24/2022 20:33    Microbiology Recent Results (from the past 240 hour(s))  SARS Coronavirus 2 by RT PCR (hospital order, performed in [facility] hospital lab) *cepheid single result test* Anterior Nasal Swab     Status: None   Collection Time: 02/08/2022  8:25 PM   Specimen: Anterior Nasal Swab  Result Value Ref Range Status   SARS Coronavirus 2 by RT PCR NEGATIVE NEGATIVE Final    Comment: (NOTE) SARS-CoV-2 target nucleic acids are NOT DETECTED.  The SARS-CoV-2 RNA is generally detectable in upper and lower respiratory specimens during the acute phase of infection. The lowest concentration of SARS-CoV-2 viral copies this assay can detect is 250 copies / mL. A negative result does not preclude SARS-CoV-2 infection and should not be used as the sole basis for treatment or other patient management decisions.  A negative result may occur with improper specimen collection / handling, submission of specimen other than  nasopharyngeal swab, presence of viral mutation(s) within the areas targeted by this assay, and inadequate number of viral copies (<250 copies / mL). A negative result must be combined with clinical observations, patient history, and epidemiological information.  Fact Sheet for Patients:   https://www.patel.info/  Fact Sheet for Healthcare Providers: https://hall.com/  This test is not yet approved or  cleared by the [place] FDA and has been authorized for detection and/or diagnosis of SARS-CoV-2 by FDA under an Emergency Use Authorization (EUA).  This EUA will remain in effect (meaning this test can be used) for the duration of the COVID-19 declaration under Section 564(b)(1) of the Act, 21 U.S.C. section 360bbb-3(b)(1), unless the authorization is terminated or revoked sooner.  Performed at [facility], [place] [address] [place], [place] [address]   MRSA Next Gen by PCR, Nasal     Status: None   Collection Time: 01/24/2022 11:14 PM   Specimen: Nasal Mucosa; Nasal Swab  Result Value Ref Range Status   MRSA by PCR Next Gen NOT DETECTED NOT DETECTED Final    Comment: (NOTE) The GeneXpert MRSA Assay (FDA approved for NASAL specimens only), is one component of a comprehensive MRSA colonization surveillance program. It is not intended to diagnose MRSA infection nor to guide or monitor treatment for MRSA infections. Test performance is not FDA approved in patients less than 89 years old. Performed at [facility], [place] [address], [place], [place] [address]   Culture, Respiratory w Gram Stain (tracheal aspirate)     Status: None   Collection Time: 01/17/2022 11:29 PM   Specimen: Tracheal Aspirate; Respiratory  Result Value Ref Range Status   Specimen Description TRACHEAL ASPIRATE  Final   Special Requests NONE  Final   Gram Stain   Final    NO SQUAMOUS EPITHELIAL CELLS SEEN NO WBC SEEN NO ORGANISMS SEEN    Culture   Final     FEW Normal respiratory flora-no Staph aureus or Pseudomonas seen Performed at [facility], [address] [place], [place] [address]    Report Status 01/16/2022 FINAL  Final  Culture, blood (Routine X 2) w Reflex to ID Panel     Status: None (Preliminary result)   Collection Time: 01/20/2022  6:00 AM   Specimen: BLOOD RIGHT HAND  Result Value Ref Range Status   Specimen Description BLOOD RIGHT HAND  Final   Special Requests   Final    BOTTLES DRAWN AEROBIC AND ANAEROBIC Blood Culture adequate volume   Culture   Final    NO GROWTH 4 DAYS Performed at [facility], [place] [address],  [place], [place] [address]    Report Status PENDING  Incomplete  Culture, blood (Routine X 2) w Reflex to ID Panel     Status: None (Preliminary result)   Collection Time: 03-Feb-2022  6:17 AM   Specimen: BLOOD  Result Value Ref Range Status   Specimen Description BLOOD THUMB RIGHT  Final   Special Requests   Final    BOTTLES DRAWN AEROBIC AND ANAEROBIC Blood Culture adequate volume   Culture   Final    NO GROWTH 4 DAYS Performed at [facility], [address], [place], [place] [address]    Report Status PENDING  Incomplete    Lab Basic Metabolic Panel: Recent Labs  Lab 01/19/2022 2009 02/02/2022 2018 02/11/2022 2056 02-03-2022 0339 02-03-22 0519  NA 133* 140 136 135 138  K 5.0 5.0 4.6 4.5 4.1  CL 111 105  --   --  107  CO2  --  19*  --   --  12*  GLUCOSE 305* 370*  --   --  359*  BUN 36* 28*  --   --  41*  CREATININE 1.50* 1.53*  --   --  2.69*  CALCIUM  --  8.2*  --   --  7.6*  MG  --  2.4  --   --  2.0   Liver Function Tests: Recent Labs  Lab 02/06/2022 2018 03-Feb-2022 0519  AST 73* 200*  ALT 57* 92*  ALKPHOS 82 76  BILITOT 0.3 0.6  PROT 4.7* 4.9*  ALBUMIN 2.6* 2.5*   Recent Labs  Lab 02/04/2022 2018  LIPASE 24   Recent Labs  Lab 02/11/2022 2220  AMMONIA 56*   CBC: Recent Labs  Lab 01/24/2022 2009 02/01/2022 2018 02/09/2022 2056 03-Feb-2022 0339 02/03/2022 0519  WBC   --  11.6*  --   --  18.0*  NEUTROABS  --  5.5  --   --   --   HGB 10.9* 9.8* 9.9* 10.9* 10.2*  HCT 32.0* 32.7* 29.0* 32.0* 33.5*  MCV  --  92.9  --   --  89.8  PLT  --  160  --   --  221   Cardiac Enzymes: No results for input(s): "CKTOTAL", "CKMB", "CKMBINDEX", "TROPONINI" in the last 168 hours. Sepsis Labs: Recent Labs  Lab 01/26/2022 2018 02/03/22 0112 02-03-22 0519 2022-02-03 0702  PROCALCITON <0.10  --   --   --   WBC 11.6*  --  18.0*  --   LATICACIDVEN 7.6* 7.2*  --  >9.0*    Procedures/Operations  EET EEG   Verdis Koval L Rebekkah Powless 01/19/2022, 8:16 AM

## 2022-02-13 NOTE — Consult Note (Signed)
Neurology Consultation  Reason for Consult: Postcardiac arrest myoclonic seizures Referring Physician: Dr. Tacy Learn  CC: Myoclonic jerking  History is obtained from: Chart  HPI: Meredith Young is a 76 y.o. female past medical history of diabetes, hypertension, hyperlipidemia, aortic stenosis, peripheral artery disease, hypothyroidism, documented history of carotid stenosis right ICA greater than 70% left ICA 50 to 69%, presented to the emergency room via [place] call for shortness of breath and hypertension, patient placed on CPAP for increased work of breathing with saturations remaining in 70% on room air.  Right before the patient arrived to [facility], ED she lost consciousness and vomited in the CPAP mask and was placed on nonrebreather.  Faint carotid pulse present, GCS 3.  Then it was noted that she had no pulse once she was moved to the bed at [facility].  Initial rhythm asystole on first pulse was recovered after 13 minutes.  Lost pulse again and ROSC was quickly obtained a few more times with total time with eventual ROSC 20 minutes at least.  She received 5 of epi and 2 of bicarb during this process.  Remained unresponsive.  Required epinephrine drip to maintain blood pressures.  Admitted to the ICU.  Soon after stabilization, started facial twitching and whole body jerking-very myoclonic appearing. Stat EEG done which was concerning for myoclonic seizures every few seconds to a minute. Neurological obtained due to the migraine jerking.  ROS: Unable to obtain due to altered mental status.   Past Medical History:  Diagnosis Date   Ankle fracture, left, closed, initial encounter 02/02/2014   Anxiety 04/28/2014   Aortic stenosis 02/24/2015   Overview:  Very mild Nov 2015   Carotid artery occlusion    Carotid stenosis, bilateral 02/27/2016   Overview:  R ICA > 70%L ICA 50-69%   Cerebrovascular disease 04/28/2014   Chronic right shoulder pain 7/89/3810   Complication  of anesthesia    Depression 04/28/2014   Diabetes mellitus type II, controlled ([place]) 04/28/2014   Diabetes mellitus without complication ([place])    Diverticulitis of colon    Essential hypertension 04/28/2014   GERD (gastroesophageal reflux disease)    Gout    Gout    Headache    History of colonic polyps 04/28/2014   History of hiatal hernia    Hx of colonic polyps    Hyperlipidemia    Hypertension    HEART BEATS FAST    Hypothyroidism    Idiopathic peripheral neuropathy    Ingrown nail of great toe of left foot 02/11/2017   Ingrown nail of great toe of right foot 01/04/2017   Low back pain 04/28/2014   OA (osteoarthritis) 04/28/2014   Occlusion and stenosis of carotid artery without mention of cerebral infarction 11/25/2012   Osteoarthritis    Palpitation 01/27/2016   Paronychia of great toe, left 02/11/2017   Paronychia of great toe, right 01/04/2017   Pernicious anemia 04/28/2014   PONV (postoperative nausea and vomiting)    RLS (restless legs syndrome) 04/28/2014   Rosacea 04/28/2014   S/P rotator cuff repair 09/17/2016   TIA (transient ischemic attack)    01/2016   Family History  Problem Relation Age of Onset   Hypertension Mother    Heart disease Mother    COPD Mother    Stroke Father    Diabetes Father    Heart disease Father        before age 3   Hyperlipidemia Father    Hypertension Father  Other Father        amputation of great toe   Diabetes Sister    Colon cancer Neg Hx    Pancreatic cancer Neg Hx    Stomach cancer Neg Hx    Esophageal cancer Neg Hx    Liver disease Neg Hx    Social History:   reports that she has quit smoking. She started smoking about 33 years ago. She has never used smokeless tobacco. She reports that she does not drink alcohol and does not use drugs.  Medications  Current Facility-Administered Medications:    0.9 %  sodium chloride infusion, 250 mL, Intravenous, Continuous, Mick Sell, PA-C, Last Rate: 20 mL/hr at January 29, 2022  0100, Infusion Verify at January 29, 2022 0100   acetaminophen (TYLENOL) tablet 650 mg, 650 mg, Oral, Q4H **OR** acetaminophen (TYLENOL) 160 MG/5ML solution 650 mg, 650 mg, Per Tube, Q4H **OR** acetaminophen (TYLENOL) suppository 650 mg, 650 mg, Rectal, Q4H, Mick Sell, PA-C, 650 mg at 01/18/2022 2309   [START ON 01/15/2022] acetaminophen (TYLENOL) tablet 650 mg, 650 mg, Oral, Q4H PRN **OR** [START ON 01/15/2022] acetaminophen (TYLENOL) 160 MG/5ML solution 650 mg, 650 mg, Per Tube, Q4H PRN **OR** [START ON 01/15/2022] acetaminophen (TYLENOL) suppository 650 mg, 650 mg, Rectal, Q4H PRN, Rollene Rotunda, John D, PA-C   busPIRone (BUSPAR) tablet 30 mg, 30 mg, Oral, Q8H PRN **OR** busPIRone (BUSPAR) tablet 30 mg, 30 mg, Per Tube, Q8H PRN, Andres Labrum D, PA-C   Chlorhexidine Gluconate Cloth 2 % PADS 6 each, 6 each, Topical, Daily, Payne, John D, PA-C   docusate (COLACE) 50 MG/5ML liquid 100 mg, 100 mg, Per Tube, BID, Rollene Rotunda, John D, PA-C, 100 mg at 2022/01/29 0100   EPINEPHrine (ADRENALIN) 5 mg in NS 250 mL (0.02 mg/mL) premix infusion, 0.5-20 mcg/min, Intravenous, Titrated, Fredia Sorrow, MD, Stopped at 01-29-2022 0050   fentaNYL (SUBLIMAZE) bolus via infusion 25-100 mcg, 25-100 mcg, Intravenous, Q15 min PRN, Mick Sell, PA-C, 25 mcg at 02/08/2022 2328   fentaNYL (SUBLIMAZE) injection 25 mcg, 25 mcg, Intravenous, Once, Rollene Rotunda, John D, PA-C   fentaNYL 2544mg in NS 2560m(1036mml) infusion-PREMIX, 25-200 mcg/hr, Intravenous, Continuous, PayRollene Rotundaohn D, PA-C, Last Rate: 12.5 mL/hr at 07/July 17, 202300, 125 mcg/hr at 01/2022-01-1699   heparin ADULT infusion 100 units/mL (25000 units/250m54m850 Units/hr, Intravenous, Continuous, LedfErenest BlankH, Last Rate: 8.5 mL/hr at 07/007-17-230, 850 Units/hr at 07/02023-07-170   insulin aspart (novoLOG) injection 0-15 Units, 0-15 Units, Subcutaneous, Q4H, Payne, John D, PA-C   insulin aspart (novoLOG) injection 20 Units, 20 Units, Subcutaneous, Once, DelaBurnard LeightoShane Crutch   ipratropium-albuterol  (DUONEB) 0.5-2.5 (3) MG/3ML nebulizer solution 3 mL, 3 mL, Nebulization, Q6H PRN, PaynMick Sell-C   levETIRAcetam (KEPPRA) 4,000 mg in sodium chloride 0.9 % 250 mL IVPB, 4,000 mg, Intravenous, Once, ChanJacky Kindle, Last Rate: 966.7 mL/hr at 07/02023-07-177, 4,000 mg at 07/02023-07-177   levETIRAcetam (KEPPRA) IVPB 1000 mg/100 mL premix, 1,000 mg, Intravenous, Q12H, ChanJacky Kindle   levothyroxine (SYNTHROID) tablet 88 mcg, 88 mcg, Per Tube, QAC breakfast, PaynRollene Rotundahn D, PA-C   magnesium sulfate IVPB 2 g 50 mL, 2 g, Intravenous, Once PRN, PaynAndres LabrumPA-C   norepinephrine (LEVOPHED) '4mg'$  in 250mL44m016 mg/mL) premix infusion, 0-40 mcg/min, Intravenous, Titrated, PayneMick SellC, Last Rate: 63.8 mL/hr at 07/022023-07-17, 17 mcg/min at 01/206/17/2023   Oral care mouth rinse, 15 mL, Mouth Rinse, Q2H, Payne, John D, PA-C, 15 mL at 01/216-Jul-2023  Oral care mouth rinse, 15 mL, Mouth Rinse, PRN, Andres Labrum D, PA-C   pantoprazole sodium (PROTONIX) 40 mg/20 mL oral suspension 40 mg, 40 mg, Per Tube, Daily, Rollene Rotunda, John D, PA-C   piperacillin-tazobactam (ZOSYN) IVPB 3.375 g, 3.375 g, Intravenous, Once **FOLLOWED BY** piperacillin-tazobactam (ZOSYN) IVPB 3.375 g, 3.375 g, Intravenous, Q8H, Chand, [place]ham, MD   polyethylene glycol (MIRALAX / GLYCOLAX) packet 17 g, 17 g, Per Tube, Daily, Rollene Rotunda, John D, PA-C   propofol (DIPRIVAN) 1000 MG/100ML infusion, 0-50 mcg/kg/min, Intravenous, Continuous, Mick Sell, PA-C  Exam: Current vital signs: BP (!) 119/49 (BP Location: Right Arm)   Pulse 96   Temp (!) 97 F (36.1 C) (Axillary)   Resp (!) 28   Ht '5\' 3"'$  (1.6 m)   Wt 78.8 kg   SpO2 98%   BMI 30.77 kg/m  Vital signs in last 24 hours: Temp:  [96.8 F (36 C)-97.1 F (36.2 C)] 97 F (36.1 C) (07/02 0000) Pulse Rate:  [96-155] 96 (07/02 0100) Resp:  [14-83] 28 (07/02 0100) BP: (77-229)/(12-115) 119/49 (07/02 0100) SpO2:  [78 %-100 %] 98 % (07/02 0100) Arterial Line BP: (103-114)/(45-46) 114/46  (07/02 0100) FiO2 (%):  [50 %-100 %] 50 % (07/02 0100) Weight:  [78.8 kg] 78.8 kg (07/01 2110)  General: Intubated, on fentanyl HEENT: Normocephalic atraumatic CVS: Regular rate rhythm Respiratory: Intubated and vented Abdomen nondistended nontender Extremities warm well perfused Neurological exam Intubated sedated No spontaneous movements Very frequent whole face myoclonic twitching. Nonverbal Not following any commands Cranial nerves: Pupils are nearly pinpoint with the left pupil somewhat smaller than right but sluggishly reactive to light.  Corneals absent bilaterally.  Cough and gag absent.  Breathing with the ventilator.  Facial symmetry difficult to ascertain due to the tube. Motor examination: No spontaneous movements.  No response to noxious stimulation Sensory exam: As above Coordination cannot be tested given mentation NIHSS >32  Labs I have reviewed labs in epic and the results pertinent to this consultation are: CBC    Component Value Date/Time   WBC 11.6 (H) 02/03/2022 2018   RBC 3.52 (L) 02/11/2022 2018   HGB 9.9 (L) 02/07/2022 2056   HGB 12.9 12/28/2020 1612   HCT 29.0 (L) 01/19/2022 2056   HCT 38.7 12/28/2020 1612   PLT 160 02/02/2022 2018   PLT 257 12/28/2020 1612   MCV 92.9 01/19/2022 2018   MCV 92 12/28/2020 1612   MCH 27.8 02/04/2022 2018   MCHC 30.0 01/31/2022 2018   RDW 15.7 (H) 01/24/2022 2018   RDW 13.2 12/28/2020 1612   LYMPHSABS 4.5 (H) 01/18/2022 2018   LYMPHSABS 1.7 12/28/2020 1612   MONOABS 0.5 02/01/2022 2018   EOSABS 0.6 (H) 01/23/2022 2018   EOSABS 0.2 12/28/2020 1612   BASOSABS 0.0 02/09/2022 2018   BASOSABS 0.1 12/28/2020 1612    CMP     Component Value Date/Time   NA 136 02/04/2022 2056   NA 140 12/28/2020 1612   K 4.6 01/18/2022 2056   CL 105 02/01/2022 2018   CO2 19 (L) 01/16/2022 2018   GLUCOSE 370 (H) 02/03/2022 2018   BUN 28 (H) 01/29/2022 2018   BUN 36 (H) 12/28/2020 1612   CREATININE 1.53 (H) 02/06/2022 2018    CALCIUM 8.2 (L) 01/17/2022 2018   PROT 4.7 (L) 01/17/2022 2018   ALBUMIN 2.6 (L) 01/30/2022 2018   AST 73 (H) 01/17/2022 2018   ALT 57 (H) 02/03/2022 2018   ALKPHOS 82 01/20/2022 2018   BILITOT 0.3 02/05/2022 2018  GFRNONAA 35 (L) 01/26/2022 2018   GFRAA >60 02/28/2016 0438    Imaging I have reviewed the images obtained:  CT-head-no acute intracranial pathology  Neurodiagnostics: Spot EEG with very frequent myoclonic seizures.  Assessment:  76 year old with above past medical history with myoclonic jerking status post cardiac arrest. Clinical exam with no elicitable cortical response. Presence of myoclonic jerks soon after cardiac arrest as well as a poor neurological exam is usually a poor prognosticators of neurologically meaningful recovery. 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 Propofol should be considered but her blood pressures are running soft so I would be mindful of that as well. Continue LTM EEG Too early to prognosticate but myoclonic jerking so soon after cardiac arrest usually portends poor chances of neurologically meaningful recovery.  D/w Dr Tacy Learn -- Amie Portland, MD Neurologist [facility] Pager: [phone]  CRITICAL CARE ATTESTATION Performed by: Amie Portland, MD Total critical care time: 45 minutes Critical care time was exclusive of separately billable  procedures and treating other patients and/or supervising APPs/Residents/Students Critical care was necessary to treat or prevent imminent or life-threatening deterioration due to myoclonic seizures, myoclonic status epilepticus, postcardiac arrest and anoxic brain injury This patient is critically ill and at significant risk for neurological worsening and/or death and care requires constant monitoring. Critical care was time spent personally by me on the following activities: development of treatment plan with patient and/or surrogate as well as nursing, discussions with consultants, evaluation of patient's response to treatment, examination of patient, obtaining history from patient or surrogate, ordering and performing treatments and interventions, ordering and review of laboratory studies, ordering and review of radiographic studies, pulse oximetry, re-evaluation of patient's condition, participation in multidisciplinary rounds and medical decision making of high complexity in the care of this patient.

## 2022-02-13 NOTE — Progress Notes (Signed)
[facility] Progress Note Patient Name: Meredith Young DOB: Jan 05, 1946 MRN: [id number]   Date of Service  2022-01-21  HPI/Events of Note  Notified for multiple issues:  - CBG 406 from 436 after administration of 20 units insulin aspart [place].  - Lactate remains elevated at  7.2 - Foley catheter in place, no urine output. - MAP still <65 despite Levophed currently at max rate.    eICU Interventions  - Would start insulin drip for persistent hyperglycemia. Would target glucose '180mg'$ /dL - 547m bolus ordered.  - Continue to monitor I/Os closely.  - Increased ceiling dose on levophed, titrate to target MAP >65 - Added vasopressin as well.         [name] M DELA CRUZ 709-Jul-2023 4:26 AM

## 2022-02-13 NOTE — Progress Notes (Signed)
ANTICOAGULATION CONSULT NOTE - Initial Consult  Pharmacy Consult for Heparin  Indication: chest pain/ACS, elevated troponin   Allergies  Allergen Reactions   Statins Other (See Comments)    Leg aches, Myalgias   Codeine Nausea And Vomiting   Demerol [Meperidine] Nausea And Vomiting and Other (See Comments)    Violent behavior/reaction   Diltiazem Other (See Comments)    Pt does not remember reaction    Metformin Other (See Comments)    May blood sugar drop    Patient Measurements: Height: '5\' 3"'$  (160 cm) Weight: 78.8 kg (173 lb 11.6 oz) IBW/kg (Calculated) : 52.4  Vital Signs: Temp: 97 F (36.1 C) (07/02 0000) Temp Source: Axillary (07/02 0000) BP: 119/49 (07/02 0100) Pulse Rate: 96 (07/02 0100)  Labs: Recent Labs    02/07/2022 2009 01/19/2022 2018 01/16/2022 2056 02/05/2022 2220  HGB 10.9* 9.8* 9.9*  --   HCT 32.0* 32.7* 29.0*  --   PLT  --  160  --   --   APTT  --   --   --  65*  LABPROT  --   --   --  20.7*  INR  --   --   --  1.8*  CREATININE 1.50* 1.53*  --   --   TROPONINIHS  --  326*  --  2,403*    Estimated Creatinine Clearance: 31.1 mL/min (A) (by C-G formula based on SCr of 1.53 mg/dL (H)).   Medical History: Past Medical History:  Diagnosis Date   Ankle fracture, left, closed, initial encounter 02/02/2014   Anxiety 04/28/2014   Aortic stenosis 02/24/2015   Overview:  Very mild Nov 2015   Carotid artery occlusion    Carotid stenosis, bilateral 02/27/2016   Overview:  R ICA > 70%L ICA 50-69%   Cerebrovascular disease 04/28/2014   Chronic right shoulder pain 03/29/7828   Complication of anesthesia    Depression 04/28/2014   Diabetes mellitus type II, controlled ([place]) 04/28/2014   Diabetes mellitus without complication ([place])    Diverticulitis of colon    Essential hypertension 04/28/2014   GERD (gastroesophageal reflux disease)    Gout    Gout    Headache    History of colonic polyps 04/28/2014   History of hiatal hernia    Hx of colonic polyps     Hyperlipidemia    Hypertension    HEART BEATS FAST    Hypothyroidism    Idiopathic peripheral neuropathy    Ingrown nail of great toe of left foot 02/11/2017   Ingrown nail of great toe of right foot 01/04/2017   Low back pain 04/28/2014   OA (osteoarthritis) 04/28/2014   Occlusion and stenosis of carotid artery without mention of cerebral infarction 11/25/2012   Osteoarthritis    Palpitation 01/27/2016   Paronychia of great toe, left 02/11/2017   Paronychia of great toe, right 01/04/2017   Pernicious anemia 04/28/2014   PONV (postoperative nausea and vomiting)    RLS (restless legs syndrome) 04/28/2014   Rosacea 04/28/2014   S/P rotator cuff repair 09/17/2016   TIA (transient ischemic attack)    01/2016     Assessment: 76 y/o F s/p cardiac arrest with ROSC, now with rising troponin, starting heparin. Hgb 9.9, Plts 160, noted renal dysfunction, PTA meds reviewed.   Goal of Therapy:  Heparin level 0.3-0.7 units/ml Monitor platelets by anticoagulation protocol: Yes   Plan:  Will defer bolus with recent chest compressions Start heparin infusion at 850 units/hr Check heparin level in 8  hours Watch for bleeding  Narda Bonds, PharmD, BCPS Clinical Pharmacist Phone: [phone]

## 2022-02-13 NOTE — Procedures (Signed)
Central Venous Catheter Insertion Procedure Note  Meredith Young  [id number]  [birth year]  Date:2022-01-21  Time:12:09 AM   Provider Performing:Keyandre Pileggi   Procedure: Insertion of Non-tunneled Central Venous [phone]) with US guidance (00174)   Indication(s) Medication administration  Consent Risks of the procedure as well as the alternatives and risks of each were explained to the patient and/or caregiver.  Consent for the procedure was obtained and is signed in the bedside chart  Anesthesia Topical only with 1% lidocaine   Timeout Verified patient identification, verified procedure, site/side was marked, verified correct patient position, special equipment/implants available, medications/allergies/relevant history reviewed, required imaging and test results available.  Sterile Technique Maximal sterile technique including full sterile barrier drape, hand hygiene, sterile gown, sterile gloves, mask, hair covering, sterile ultrasound probe cover (if used).  Procedure Description Area of catheter insertion was cleaned with chlorhexidine and draped in sterile fashion.  With real-time ultrasound guidance a central venous catheter was placed into the left subclavian vein. Nonpulsatile blood flow and easy flushing noted in all ports.  The catheter was sutured in place and sterile dressing applied.  Complications/Tolerance None; patient tolerated the procedure well. Chest X-ray is ordered to verify placement for internal jugular or subclavian cannulation.   Chest x-ray is not ordered for femoral cannulation.  EBL Minimal  Specimen(s) None

## 2022-02-13 NOTE — Progress Notes (Signed)
LTM EEG hooked up and running - no initial skin breakdown - push button tested - neuro notified. [facility] monitoring.  

## 2022-02-13 NOTE — Progress Notes (Signed)
NAME:  Meredith Young, MRN:  [id number], DOB:  Mar 09, 1946, LOS: 1 ADMISSION DATE:  01/23/2022, CONSULTATION DATE:  7/1 REFERRING MD:  Dr. Rogene Houston, CHIEF COMPLAINT:  cardiac arrest   History of Present Illness:  Patient is a 76 year old female with pertinent PMH of HTN, HLD, aortic stenosis, PAD, carotid stenosis, hypothyroidism, DMT2, TIA presents to [facility] ED on 7/1 with cardiac arrest. On 7/1 patient was experiencing SOB and HTN.  [facility] called and was transferring patient to [facility].  In route patient was placed on CPAP for WOB and tripod breathing.  Sats were 70% on RA.  Right before patient arrived to [facility] ED, patient lost consciousness and vomited and CPAP mask.  Patient placed on NRB.  Faint carotid pulse present and GCS 3.  Patient arrived to [facility] ED.  Once patient moved onto bed patient had no pulse and CPR initiated.  Rhythm initially asystole and first pulse was received after 13 minutes.  Patient lost pulse and ROSC was quickly obtained a few more times.  Patient intubated.  Total ROSC 20 minutes.  Received 5 of epi and 2 bicarb.  Patient remained unresponsive.  Started on epi drip. Labs pending. PCCM consulted for ICU admission.  Pertinent  Medical History   Past Medical History:  Diagnosis Date   Ankle fracture, left, closed, initial encounter 02/02/2014   Anxiety 04/28/2014   Aortic stenosis 02/24/2015   Overview:  Very mild Nov 2015   Carotid artery occlusion    Carotid stenosis, bilateral 02/27/2016   Overview:  R ICA > 70%L ICA 50-69%   Cerebrovascular disease 04/28/2014   Chronic right shoulder pain 8/67/5449   Complication of anesthesia    Depression 04/28/2014   Diabetes mellitus type II, controlled ([place]) 04/28/2014   Diabetes mellitus without complication ([place])    Diverticulitis of colon    Essential hypertension 04/28/2014   GERD (gastroesophageal reflux disease)    Gout    Gout    Headache    History of colonic polyps 04/28/2014   History of hiatal hernia    Hx of  colonic polyps    Hyperlipidemia    Hypertension    HEART BEATS FAST    Hypothyroidism    Idiopathic peripheral neuropathy    Ingrown nail of great toe of left foot 02/11/2017   Ingrown nail of great toe of right foot 01/04/2017   Low back pain 04/28/2014   OA (osteoarthritis) 04/28/2014   Occlusion and stenosis of carotid artery without mention of cerebral infarction 11/25/2012   Osteoarthritis    Palpitation 01/27/2016   Paronychia of great toe, left 02/11/2017   Paronychia of great toe, right 01/04/2017   Pernicious anemia 04/28/2014   PONV (postoperative nausea and vomiting)    RLS (restless legs syndrome) 04/28/2014   Rosacea 04/28/2014   S/P rotator cuff repair 09/17/2016   TIA (transient ischemic attack)    01/2016    Significant Hospital Events: Including procedures, antibiotic start and stop dates in addition to other pertinent events   7/1 admitted to [facility] initially respiratory distress arrived pulseless; cardiac arrest initiated; ROSC about 20 minutes;  intubated  Interim History / Subjective:   Intubated on mechanical life support. Critically ill.   Objective   Blood pressure (!) 115/43, pulse 82, temperature 97.7 F (36.5 C), resp. rate (!) 32, height '5\' 3"'$  (1.6 m), weight 79.9 kg, SpO2 100 %. CVP:  [12 mmHg-15 mmHg] 14 mmHg  Vent Mode: PRVC FiO2 (%):  [50 %-100 %] 50 %  Set Rate:  [24 bmp-32 bmp] 32 bmp Vt Set:  [450 mL] 450 mL PEEP:  [5 cmH20] 5 cmH20 Plateau Pressure:  [20 RAQ76-22 cmH20] 20 cmH20   Intake/Output Summary (Last 24 hours) at 02/07/22 6333 Last data filed at 02/07/22 0700 Gross per 24 hour  Intake 3045.6 ml  Output 100 ml  Net 2945.6 ml   Filed Weights   01/29/2022 2110 2022-02-07 0500  Weight: 78.8 kg 79.9 kg    Examination: General:  critically ill HEENT: ETT in place  Neuro: alert following commands  CV: s1 s2, no mrg  PULM:  diminished breath sounds BL, mechanically vented  GI: soft nt nd  Extremities: warm dry, no edema  Skin: no rash    Resolved Hospital Problem list     Assessment & Plan:   Cardiac Arrest: Likely respiratory related; total ROSC 20 minutes P: Supportive post arrest care On multiple vasopressors  Making no urine  Ongoing seizures Prognosis is poor  Normothermic protocol   Acute respiratory failure with hypercarbia Possible aspiration pneumonia and pulmonary edema P: LTVV  VAP ppx  Mental status precludes liberation attempts at this time   Acute metabolic encephalopathy, secondary to post arrest  Myoclonic seizures, severe anoxic brain injury  P: Supportive care  On propofol  Appreciate neuro recommendations   Hyperglycemia DMT2 P: SSI   Anemia P: -trend CBC  Moderate Aortic stenosis HTN/HLD P: Holding hold anti-hypertensive meds   Carotid stenosis s/p R endarterectomy TIA: 2017 P: ASA + Plavix   Hx of hypothyroidism P: Continue synthroid   Hx of depression/anxiety P: Home ativan   PAD: 09/2021 R common iliac artery stent placed P: ASA + plavix   GERD P: PPI   RLS P: Mirapex   Gout P: Holding allopurinol   Goals of care:  We will need to address goals of care with patients family.    Best Practice (right click and "Reselect all SmartList Selections" daily)   Diet/type: NPO w/ meds via tube DVT prophylaxis: SCD GI prophylaxis: PPI Lines: N/A Foley:  N/A Code Status:  full code Last date of multidisciplinary goals of care discussion [7/1 per discussion with Dr. Tacy Learn, spoke with son in conference room and updated. He states he would like to continue full code and do everything for now.]  Labs   CBC: Recent Labs  Lab 01/28/2022 2009 01/16/2022 2018 02/04/2022 2056 07-Feb-2022 0339 2022-02-07 0519  WBC  --  11.6*  --   --  18.0*  NEUTROABS  --  5.5  --   --   --   HGB 10.9* 9.8* 9.9* 10.9* 10.2*  HCT 32.0* 32.7* 29.0* 32.0* 33.5*  MCV  --  92.9  --   --  89.8  PLT  --  160  --   --  545    Basic Metabolic Panel: Recent Labs  Lab 02/03/2022 2009  01/22/2022 2018 01/21/2022 2056 02-07-2022 0339 02-07-2022 0519  NA 133* 140 136 135 138  K 5.0 5.0 4.6 4.5 4.1  CL 111 105  --   --  107  CO2  --  19*  --   --  12*  GLUCOSE 305* 370*  --   --  359*  BUN 36* 28*  --   --  41*  CREATININE 1.50* 1.53*  --   --  2.69*  CALCIUM  --  8.2*  --   --  7.6*  MG  --  2.4  --   --  2.0  GFR: Estimated Creatinine Clearance: 17.8 mL/min (A) (by C-G formula based on SCr of 2.69 mg/dL (H)). Recent Labs  Lab 01/25/2022 2018 Jan 16, 2022 0112 2022-01-16 0519  PROCALCITON <0.10  --   --   WBC 11.6*  --  18.0*  LATICACIDVEN 7.6* 7.2*  --     Liver Function Tests: Recent Labs  Lab 01/28/2022 2018 01-16-2022 0519  AST 73* 200*  ALT 57* 92*  ALKPHOS 82 76  BILITOT 0.3 0.6  PROT 4.7* 4.9*  ALBUMIN 2.6* 2.5*   Recent Labs  Lab 02/05/2022 2018  LIPASE 24   Recent Labs  Lab 01/22/2022 2220  AMMONIA 56*    ABG    Component Value Date/Time   PHART 7.272 (L) January 16, 2022 0339   PCO2ART 30.8 (L) Jan 16, 2022 0339   PO2ART 74 (L) Jan 16, 2022 0339   HCO3 14.3 (L) 01-16-2022 0339   TCO2 15 (L) 16-Jan-2022 0339   ACIDBASEDEF 12.0 (H) 2022/01/16 0339   O2SAT 93 01-16-2022 0339     Coagulation Profile: Recent Labs  Lab 02/04/2022 2220  INR 1.8*    Cardiac Enzymes: No results for input(s): "CKTOTAL", "CKMB", "CKMBINDEX", "TROPONINI" in the last 168 hours.  HbA1C: Hgb A1c MFr Bld  Date/Time Value Ref Range Status  01-16-2022 01:12 AM 6.1 (H) 4.8 - 5.6 % Final    Comment:    (NOTE) Pre diabetes:          5.7%-6.4%  Diabetes:              >6.4%  Glycemic control for   <7.0% adults with diabetes   02/22/2016 03:23 PM 6.7 (H) 4.8 - 5.6 % Final    Comment:    (NOTE)         Pre-diabetes: 5.7 - 6.4         Diabetes: >6.4         Glycemic control for adults with diabetes: <7.0     CBG: Recent Labs  Lab 01/19/2022 2258 01/16/2022 0107 01-16-2022 0339 01/16/2022 0515 01-16-2022 0658  GLUCAP 411* 435* 407* 334* 275*    Review of Systems:   Patient  is encephalopathic and/or intubated. Therefore history has been obtained from chart review.    Past Medical History:  She,  has a past medical history of Ankle fracture, left, closed, initial encounter (02/02/2014), Anxiety (04/28/2014), Aortic stenosis (02/24/2015), Carotid artery occlusion, Carotid stenosis, bilateral (02/27/2016), Cerebrovascular disease (04/28/2014), Chronic right shoulder pain (12/20/3708), Complication of anesthesia, Depression (04/28/2014), Diabetes mellitus type II, controlled ([place]) (04/28/2014), Diabetes mellitus without complication ([place]), Diverticulitis of colon, Essential hypertension (04/28/2014), GERD (gastroesophageal reflux disease), Gout, Gout, Headache, History of colonic polyps (04/28/2014), History of hiatal hernia, colonic polyps, Hyperlipidemia, Hypertension, Hypothyroidism, Idiopathic peripheral neuropathy, Ingrown nail of great toe of left foot (02/11/2017), Ingrown nail of great toe of right foot (01/04/2017), Low back pain (04/28/2014), OA (osteoarthritis) (04/28/2014), Occlusion and stenosis of carotid artery without mention of cerebral infarction (11/25/2012), Osteoarthritis, Palpitation (01/27/2016), Paronychia of great toe, left (02/11/2017), Paronychia of great toe, right (01/04/2017), Pernicious anemia (04/28/2014), PONV (postoperative nausea and vomiting), RLS (restless legs syndrome) (04/28/2014), Rosacea (04/28/2014), S/P rotator cuff repair (09/17/2016), and TIA (transient ischemic attack).   Surgical History:   Past Surgical History:  Procedure Laterality Date   ABDOMINAL AORTOGRAM W/LOWER EXTREMITY Bilateral 09/25/2021   Procedure: ABDOMINAL AORTOGRAM W/LOWER EXTREMITY;  Surgeon: Waynetta Sandy, MD;  Location: [place] [facility];  Service: Cardiovascular;  Laterality: Bilateral;   ABDOMINAL HYSTERECTOMY  1977   ANKLE SURGERY  LEFT   APPENDECTOMY  1967   CHOLECYSTECTOMY  1991   COLONOSCOPY  06/04/2011   Colonic polyps status post polypectomy.  MOderate redominantly sigmoid diverticuolosis. Small internal hemorrhoids.   ENDARTERECTOMY Right 02/27/2016   Procedure: RIGHT ENDARTERECTOMY CAROTID;  Surgeon: Waynetta Sandy, MD;  Location: [facility] OR;  Service: Vascular;  Laterality: Right;   ESOPHAGOGASTRODUODENOSCOPY  01/21/2008   Erosive gastritis. Otherwise normal EGD   LEFT HEART CATH AND CORONARY ANGIOGRAPHY N/A 01/03/2021   Procedure: LEFT HEART CATH AND CORONARY ANGIOGRAPHY;  Surgeon: Leonie Man, MD;  Location: [place] [facility];  Service: Cardiovascular;  Laterality: N/A;   [facility] Right 02/27/2016   Procedure: PATCH ANGIOPLASTY USING Rue[employer] Bash BIOLOGIC PATCH;  Surgeon: Waynetta Sandy, MD;  Location: [place];  Service: Vascular;  Laterality: Right;   PERIPHERAL VASCULAR INTERVENTION Bilateral 09/25/2021   Procedure: PERIPHERAL VASCULAR INTERVENTION;  Surgeon: Waynetta Sandy, MD;  Location: [place] [facility];  Service: Cardiovascular;  Laterality: Bilateral;  iliacs   POLYPECTOMY  1985   TOOTH EXTRACTION       Social History:   reports that she has quit smoking. She started smoking about 33 years ago. She has never used smokeless tobacco. She reports that she does not drink alcohol and does not use drugs.   Family History:  Her family history includes COPD in her mother; Diabetes in her father and sister; Heart disease in her father and mother; Hyperlipidemia in her father; Hypertension in her father and mother; Other in her father; Stroke in her father. There is no history of Colon cancer, Pancreatic cancer, Stomach cancer, Esophageal cancer, or Liver disease.   Allergies Allergies  Allergen Reactions   Statins Other (See Comments)    Leg aches, Myalgias   Cardizem [Diltiazem] Other (See Comments)    Unknown reaction   Codeine Nausea And Vomiting   Demerol [Meperidine] Nausea And Vomiting and Other (See Comments)    Violent behavior/reaction   Metformin  Other (See Comments)    May blood sugar drop     Home Medications  Prior to Admission medications   Medication Sig Start Date End Date Taking? Authorizing Provider  acetaminophen (TYLENOL) 500 MG tablet Take 500-1,000 mg by mouth every 6 (six) hours as needed for mild pain or moderate pain.    [provider]  allopurinol (ZYLOPRIM) 300 MG tablet Take 300 mg by mouth daily in the afternoon. midday    [provider]  amLODipine (NORVASC) 5 MG tablet Take 5 mg by mouth daily in the afternoon. midday 03/27/17   [provider]  Apoaequorin (PREVAGEN) 10 MG CAPS Take 10 mg by mouth daily.    [provider]  aspirin EC 81 MG tablet Take 81 mg by mouth at bedtime.    [provider]  carboxymethylcellulose (REFRESH PLUS) 0.5 % SOLN Place 1 drop into both eyes 2 (two) times daily as needed (dry eyes).    [provider]  clopidogrel (PLAVIX) 75 MG tablet Take 1 tablet (75 mg total) by mouth daily. 09/25/21 09/25/22  Waynetta Sandy, MD  Dulaglutide (TRULICITY) 1.5 GL/8.7FI SOPN Inject 1.5 mg into the skin every Saturday.    [provider]  fexofenadine (ALLEGRA) 180 MG tablet Take 180 mg by mouth daily as needed for allergies or rhinitis.    [provider]  folic acid (FOLVITE) 1 MG tablet Take 1 mg by mouth in the morning and at bedtime. 05/08/19  [provider]  glimepiride (AMARYL) 1 MG tablet Take 1 mg by mouth daily as needed (blood sugar greater 150.). 03/18/19   [provider]  levothyroxine (SYNTHROID) 88 MCG tablet Take 88 mcg by mouth daily before breakfast.    [provider]  LORazepam (ATIVAN) 0.5 MG tablet Take 0.5 mg by mouth 2 (two) times daily as needed (panic attacks). 04/08/20   [provider]  losartan (COZAAR) 100 MG tablet Take 100 mg by mouth at bedtime. 02/28/17   [provider]  metoprolol tartrate (LOPRESSOR) 25 MG tablet Take 12.5 mg by mouth 2  (two) times daily.    [provider]  Multiple Vitamins-Minerals (MULTIVITAMIN PO) Take 1 tablet by mouth daily.     [provider]  naproxen sodium (ALEVE) 220 MG tablet Take 220 mg by mouth daily as needed (pain).    [provider]  omeprazole (PRILOSEC) 20 MG capsule Take 20 mg by mouth daily in the afternoon. midday    [provider]  PERCOCET 5-325 MG tablet SMARTSIG:1 Each By Mouth Every 4 Hours 10/20/21   [provider]  pramipexole (MIRAPEX) 0.5 MG tablet Take 0.5 mg by mouth at bedtime. 07/18/21   [provider]     This patient is critically ill with multiple organ system failure; which, requires frequent high complexity decision making, assessment, support, evaluation, and titration of therapies. This was completed through the application of advanced monitoring technologies and extensive interpretation of multiple databases. During this encounter critical care time was devoted to patient care services described in this note for 34 minutes.   [facility] 22-Jan-2022 7:18 AM

## 2022-02-13 NOTE — Procedures (Signed)
Extubation Procedure Note  Patient Details:   Name: ROSABELLE JUPIN DOB: [birth year] MRN: [id number]   Airway Documentation:    Vent end date: 01/23/22 Vent end time: 1425   Evaluation  O2 sats:  terminal extubation  Complications: No apparent complications Patient did tolerate procedure well. Bilateral Breath Sounds: Clear, Diminished   No Pt was terminally extubated per MD order.   Ronaldo Miyamoto 2022-01-23, 2:26 PM

## 2022-02-13 NOTE — Procedures (Addendum)
Patient Name: Meredith Young  MRN: [id number]  Epilepsy Attending: Lora Havens  Referring Physician/Provider: Lora Havens, MD Duration: 01/26/22 0033 to  1114   Patient history: 76yo F s/p cardiac arrest. EEG to evaluate for seizure   Level of alertness: comatose   AEDs during EEG study: LEV, Propofol   Technical aspects: This EEG study was done with scalp electrodes positioned according to the 10-20 International system of electrode placement. Electrical activity was acquired at a sampling rate of '500Hz'$  and reviewed with a high frequency filter of '70Hz'$  and a low frequency filter of '1Hz'$ . EEG data were recorded continuously and digitally stored.    Description: Patient was noted to have episodes of whole body sudden and brief jerking as well as eye opening every 15-30 seconds. Concomitant eeg showed generalized polyspikes consistent with myoclonic seizure. In between seizures, EEG showed generalized attenuation. Hyperventilation and photic stimulation were not performed.      ABNORMALITY  Myoclonic seizure, generalized -Background attenuation, generalized   IMPRESSION: This study showed myoclonic seizures every 15-30 seconds.There was also profound diffuse encephalopathy. In the setting of cardiac arrest, this is most likely suggestive of anoxic-hypoxic brain injury.    Derian Pfost Barbra Sarks

## 2022-02-13 NOTE — Progress Notes (Signed)
Pharmacy Antibiotic Note  Meredith Young is a 76 y.o. female for which pharmacy has been consulted for zosyn dosing for  asp pneumonia .   Cr worsening overnight, cultures negative thus far.  Plan: Reduce Zosyn to 2.25g IV q8h  Height: '5\' 3"'$  (160 cm) Weight: 79.9 kg (176 lb 2.4 oz) IBW/kg (Calculated) : 52.4  Temp (24hrs), Avg:97.2 F (36.2 C), Min:96.8 F (36 C), Max:97.7 F (36.5 C)  Recent Labs  Lab 01/21/2022 2009 01/22/2022 2018 01-22-2022 0112 22-Jan-2022 0519 22-Jan-2022 0702  WBC  --  11.6*  --  18.0*  --   CREATININE 1.50* 1.53*  --  2.69*  --   LATICACIDVEN  --  7.6* 7.2*  --  >9.0*     Estimated Creatinine Clearance: 17.8 mL/min (A) (by C-G formula based on SCr of 2.69 mg/dL (H)).    Allergies  Allergen Reactions   Statins Other (See Comments)    Leg aches, Myalgias   Cardizem [Diltiazem] Other (See Comments)    Unknown reaction   Codeine Nausea And Vomiting   Demerol [Meperidine] Nausea And Vomiting and Other (See Comments)    Violent behavior/reaction   Glucophage [Metformin] Other (See Comments)    Cause blood sugar to drop too low    Antimicrobials this admission: Zosyn 7/1 >>  Microbiology results: 7/1 MRSA: negative 7/1 TA: pending 7/1 BCx: pending  Arrie Senate, PharmD, BCPS, [facility] Clinical Pharmacist [phone] Please check AMION for all [place] numbers 01/22/22

## 2022-02-13 NOTE — Progress Notes (Signed)
LTM EEG discontinued - no skin breakdown at unhook.   

## 2022-02-13 NOTE — Progress Notes (Signed)
PCCM:  I met with patients family at bedside, 2 sons and their wives. They have spoke together and decided that their mom would not want everything that is going on. She would like to pass with dignity. The patient worked as a [employer] technologist.   Everyone is in agreement to proceed.   CCM comfort care measures ordered.   They are awaiting a few visitors before extubation   Garner Nash, DO [facility] 2022/02/01 11:09 AM

## 2022-02-13 NOTE — Progress Notes (Signed)
Echocardiogram 2D Echocardiogram has been performed.  Oneal Deputy Telina Kleckley RDCS January 17, 2022, 8:21 AM

## 2022-02-13 NOTE — Progress Notes (Signed)
ANTICOAGULATION CONSULT NOTE  Pharmacy Consult for Heparin  Indication: chest pain/ACS, elevated troponin   Allergies  Allergen Reactions   Statins Other (See Comments)    Leg aches, Myalgias   Cardizem [Diltiazem] Other (See Comments)    Unknown reaction   Codeine Nausea And Vomiting   Demerol [Meperidine] Nausea And Vomiting and Other (See Comments)    Violent behavior/reaction   Glucophage [Metformin] Other (See Comments)    Cause blood sugar to drop too low    Patient Measurements: Height: '5\' 3"'$  (160 cm) Weight: 79.9 kg (176 lb 2.4 oz) IBW/kg (Calculated) : 52.4  Vital Signs: Temp: 97.7 F (36.5 C) (07/02 0600) Temp Source: Axillary (07/02 0400) BP: 116/40 (07/02 0729) Pulse Rate: 82 (07/02 0700)  Labs: Recent Labs    01/17/2022 2009 01/28/2022 2018 01/15/2022 2056 01/16/2022 2220 01/27/2022 0339 01-27-22 0519 01/27/22 0702  HGB 10.9* 9.8* 9.9*  --  10.9* 10.2*  --   HCT 32.0* 32.7* 29.0*  --  32.0* 33.5*  --   PLT  --  160  --   --   --  221  --   APTT  --   --   --  65*  --   --   --   LABPROT  --   --   --  20.7*  --   --   --   INR  --   --   --  1.8*  --   --   --   HEPARINUNFRC  --   --   --   --   --   --  0.37  CREATININE 1.50* 1.53*  --   --   --  2.69*  --   TROPONINIHS  --  326*  --  2,403*  --   --   --      Estimated Creatinine Clearance: 17.8 mL/min (A) (by C-G formula based on SCr of 2.69 mg/dL (H)).   Medical History: Past Medical History:  Diagnosis Date   Ankle fracture, left, closed, initial encounter 02/02/2014   Anxiety 04/28/2014   Aortic stenosis 02/24/2015   Overview:  Very mild Nov 2015   Carotid artery occlusion    Carotid stenosis, bilateral 02/27/2016   Overview:  R ICA > 70%L ICA 50-69%   Cerebrovascular disease 04/28/2014   Chronic right shoulder pain 1/61/0960   Complication of anesthesia    Depression 04/28/2014   Diabetes mellitus type II, controlled ([place]) 04/28/2014   Diabetes mellitus without complication ([place])     Diverticulitis of colon    Essential hypertension 04/28/2014   GERD (gastroesophageal reflux disease)    Gout    Gout    Headache    History of colonic polyps 04/28/2014   History of hiatal hernia    Hx of colonic polyps    Hyperlipidemia    Hypertension    HEART BEATS FAST    Hypothyroidism    Idiopathic peripheral neuropathy    Ingrown nail of great toe of left foot 02/11/2017   Ingrown nail of great toe of right foot 01/04/2017   Low back pain 04/28/2014   OA (osteoarthritis) 04/28/2014   Occlusion and stenosis of carotid artery without mention of cerebral infarction 11/25/2012   Osteoarthritis    Palpitation 01/27/2016   Paronychia of great toe, left 02/11/2017   Paronychia of great toe, right 01/04/2017   Pernicious anemia 04/28/2014   PONV (postoperative nausea and vomiting)    RLS (restless legs syndrome) 04/28/2014  Rosacea 04/28/2014   S/P rotator cuff repair 09/17/2016   TIA (transient ischemic attack)    01/2016     Assessment: 76 y/o F s/p cardiac arrest with ROSC, now with rising troponin, starting heparin. H/H low but stable, no AC PTA.  Initial heparin level is therapeutic at 0.37.  Goal of Therapy:  Heparin level 0.3-0.7 units/ml Monitor platelets by anticoagulation protocol: Yes   Plan:  Continue heparin 850 units/h Daily heparin level and CBC  Arrie Senate, PharmD, [place], [facility] Clinical Pharmacist [phone] Please check AMION for all [facility] numbers 01/24/22

## 2022-02-13 NOTE — Procedures (Addendum)
Patient Name: Meredith Young  MRN: [id number]  Epilepsy Attending: Lora Havens  Referring Physician/Provider: Mick Sell, PA-C Date: January 28, 2022 Duration: 26.38 mins  Patient history: 76yo F s/p cardiac arrest. EEG to evaluate for seizure  Level of alertness: comatose  AEDs during EEG study: None  Technical aspects: This EEG study was done with scalp electrodes positioned according to the 10-20 International system of electrode placement. Electrical activity was acquired at a sampling rate of '500Hz'$  and reviewed with a high frequency filter of '70Hz'$  and a low frequency filter of '1Hz'$ . EEG data were recorded continuously and digitally stored.   Description: Patient was noted to have episodes of whole body sudden and brief jerking as well as eye opening every 1 second to 1 minute. Concomitant eeg showed generalized polyspikes consistent with myoclonic seizure. In between seizure, EEG showed generalized suppression. Hyperventilation and photic stimulation were not performed.     ABNORMALITY  Myoclonic seizure, generalized -Background suppression, generalized  IMPRESSION: This study showed myoclonic seizures every 1 second to 1 minute. There is also profound diffuse encephalopathy. In the setting of cardiac arrest, this is most likely suggestive of anoxic-hypoxic brain injury.   Dr Tacy Learn and Dr Rory Percy were notified.  Allysen Lazo Barbra Sarks

## 2022-02-13 NOTE — Progress Notes (Signed)
Continues to have myoclonic sz clinically and on EEG. Load with Depakote 1500 mg x1 Start propofol Will need pressors - PCCM contacted and will manage pressors. Will continue to follow.  -- Amie Portland, MD Neurologist [facility] Pager: [phone]  Additional 15 min CC time

## 2022-02-13 NOTE — Progress Notes (Signed)
EEG complete - results pending 

## 2022-02-13 NOTE — Procedures (Signed)
Arterial Catheter Insertion Procedure Note  KELENA GARROW  [id number]  August 08, 1945  Date:01-30-22  Time:12:10 AM    Provider Performing: Jacky Kindle    Procedure: Insertion of Arterial Line [phone]) with US guidance ([id number])   Indication(s) Blood pressure monitoring and/or need for frequent ABGs  Consent Risks of the procedure as well as the alternatives and risks of each were explained to the patient and/or caregiver.  Consent for the procedure was obtained and is signed in the bedside chart  Anesthesia None   Time Out Verified patient identification, verified procedure, site/side was marked, verified correct patient position, special equipment/implants available, medications/allergies/relevant history reviewed, required imaging and test results available.   Sterile Technique Maximal sterile technique including full sterile barrier drape, hand hygiene, sterile gown, sterile gloves, mask, hair covering, sterile ultrasound probe cover (if used).   Procedure Description Area of catheter insertion was cleaned with chlorhexidine and draped in sterile fashion. With real-time ultrasound guidance an arterial catheter was placed into the left  Axillary  artery.  Appropriate arterial tracings confirmed on monitor.     Complications/Tolerance None; patient tolerated the procedure well.   EBL Minimal   Specimen(s) None

## 2022-02-13 DEATH — deceased

## 2022-04-24 ENCOUNTER — Ambulatory Visit (HOSPITAL_BASED_OUTPATIENT_CLINIC_OR_DEPARTMENT_OTHER): Payer: No Typology Code available for payment source | Admitting: Internal Medicine

## 2023-06-08 IMAGING — US US RENAL
1 series · 14 of 25 positions shown · non-contrast
Comparison: None.

CLINICAL DATA: Chronic kidney disease stage 3 B.

EXAM:
RENAL / URINARY TRACT ULTRASOUND COMPLETE

[Series 1: us renal · 0.22mm/px · 14 of 41 slices shown]
[im 1/41]
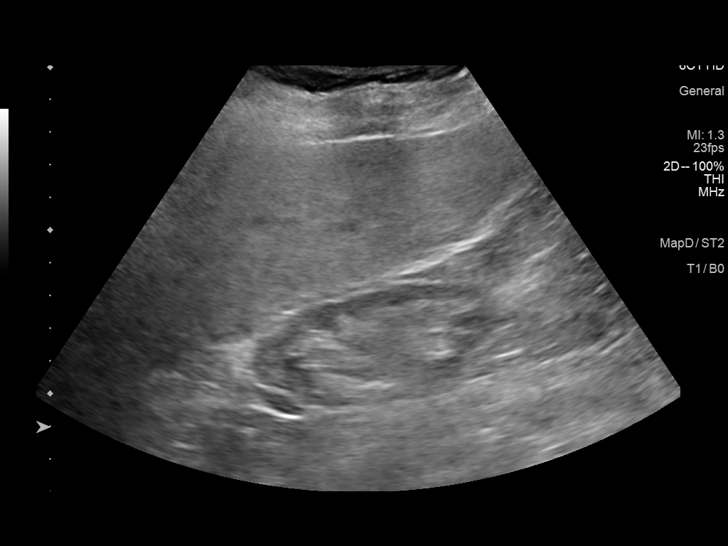
[im 4/41]
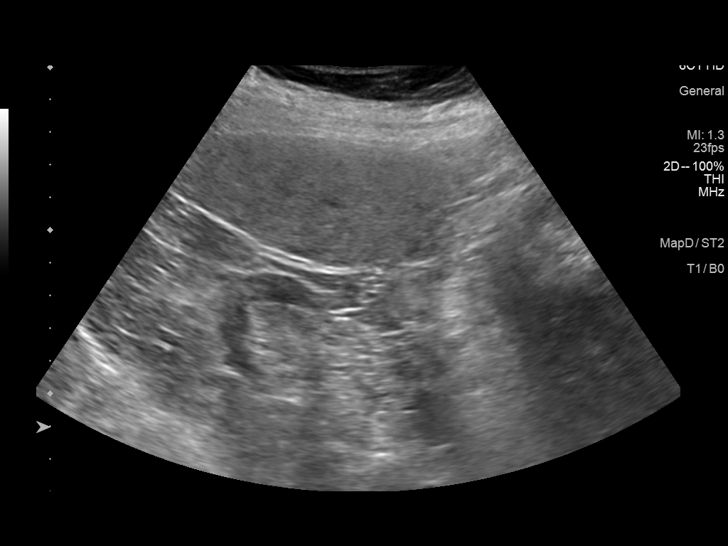
[im 7/41]
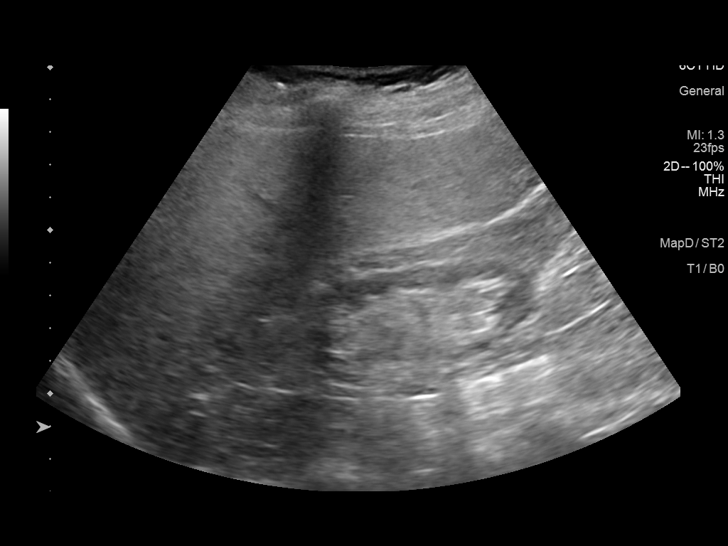
[im 11/41]
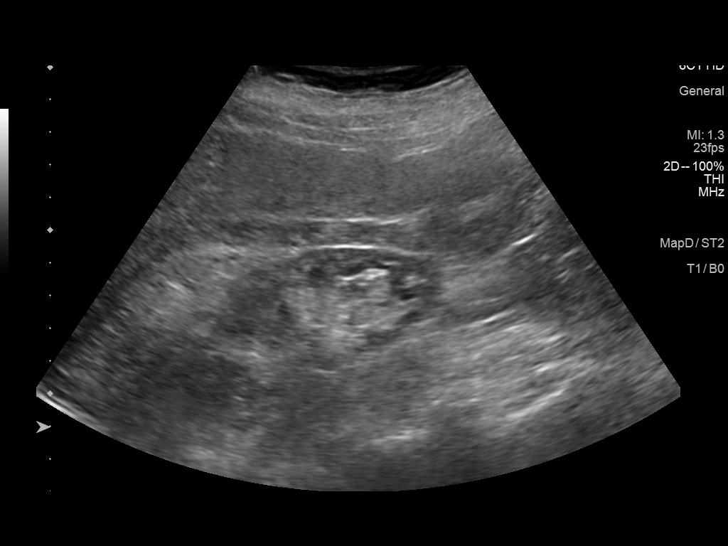
[im 14/41]
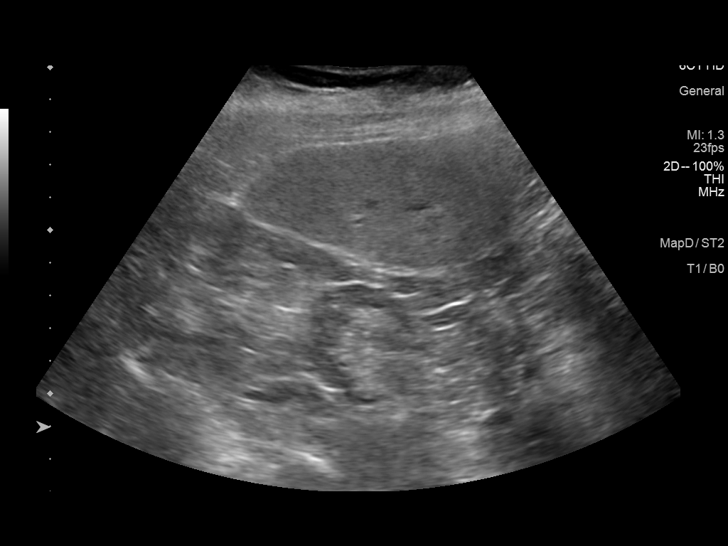
[im 16/41]
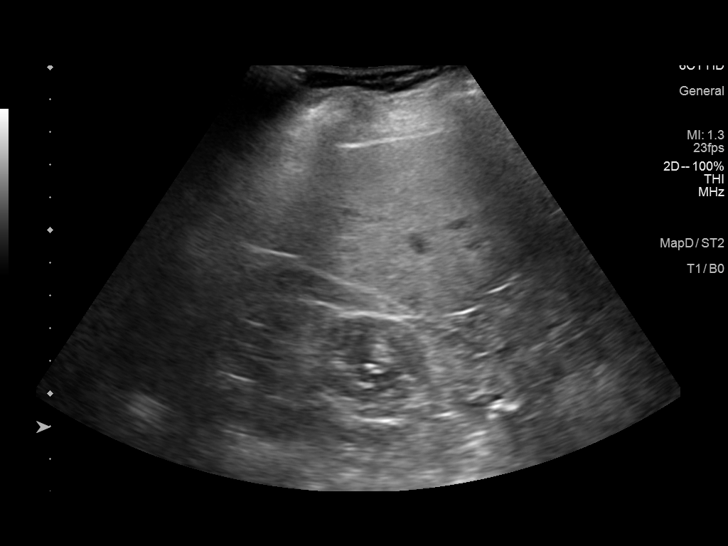
[im 19/41]
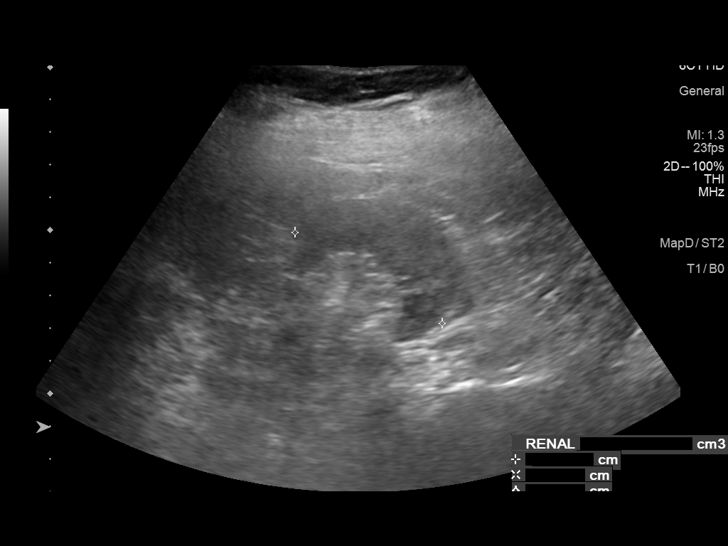
[im 22/41]
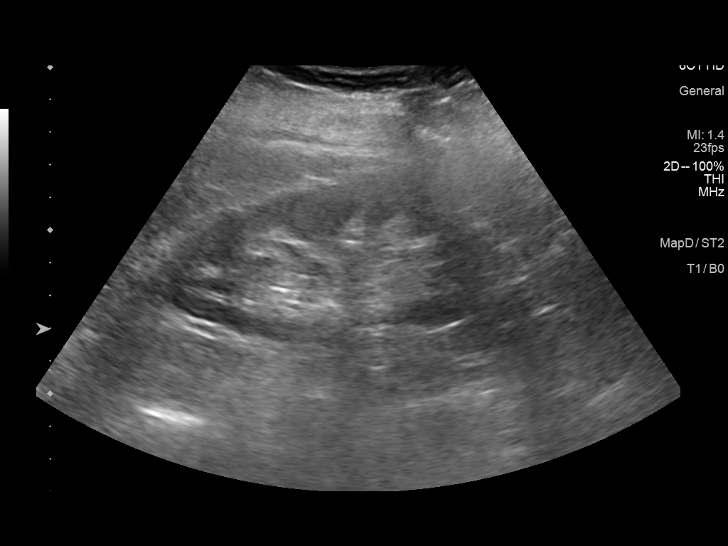
[im 26/41]
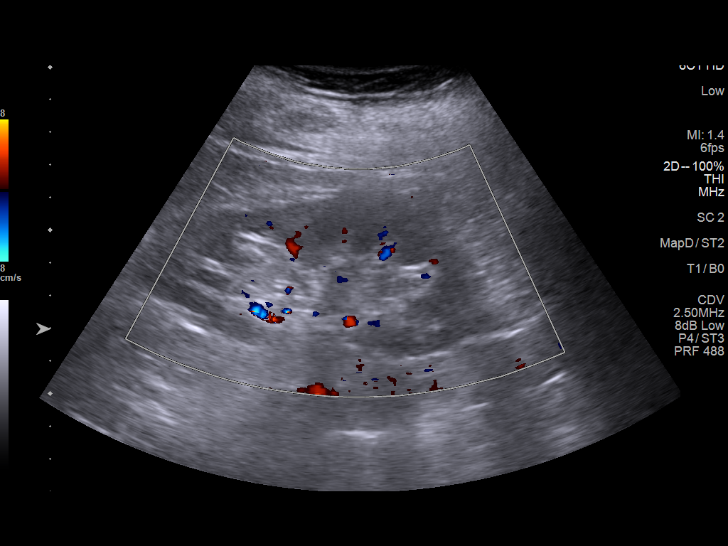
[im 27/41]
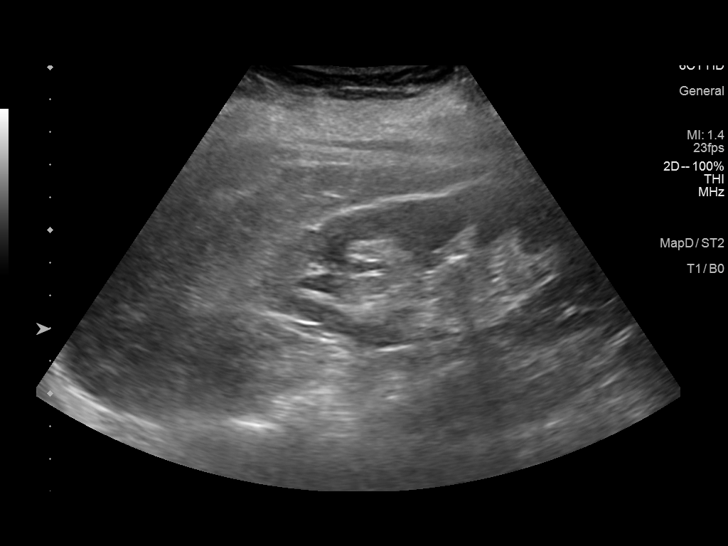
[im 31/41]
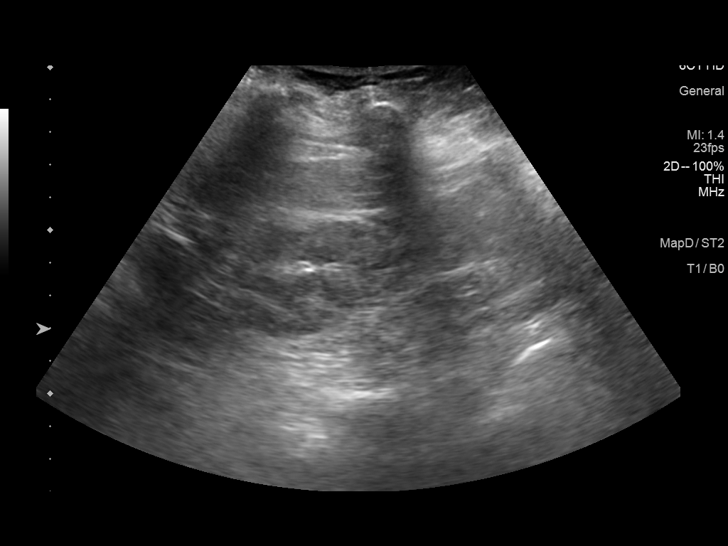
[im 34/41]
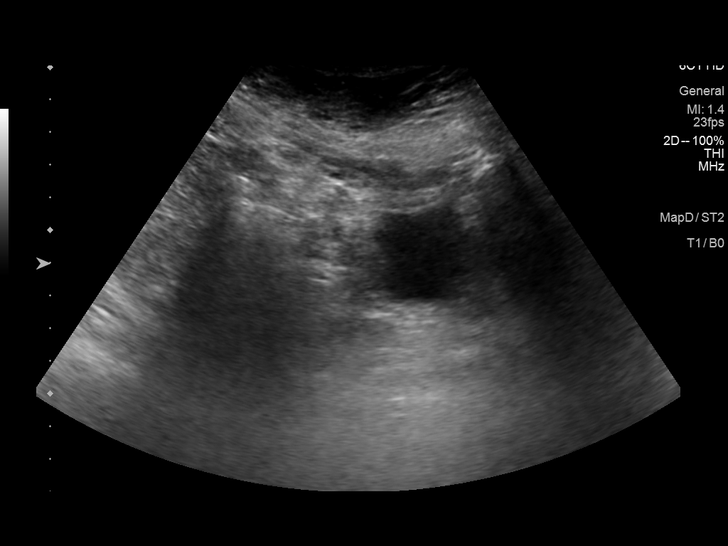
[im 37/41]
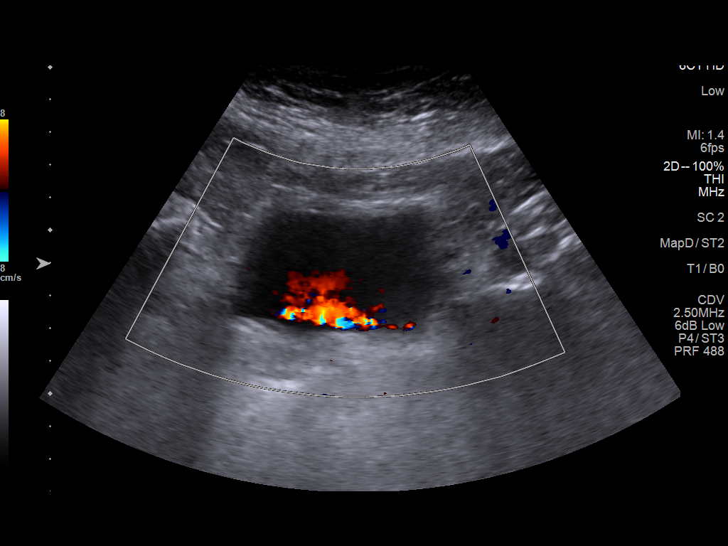
[im 41/41]
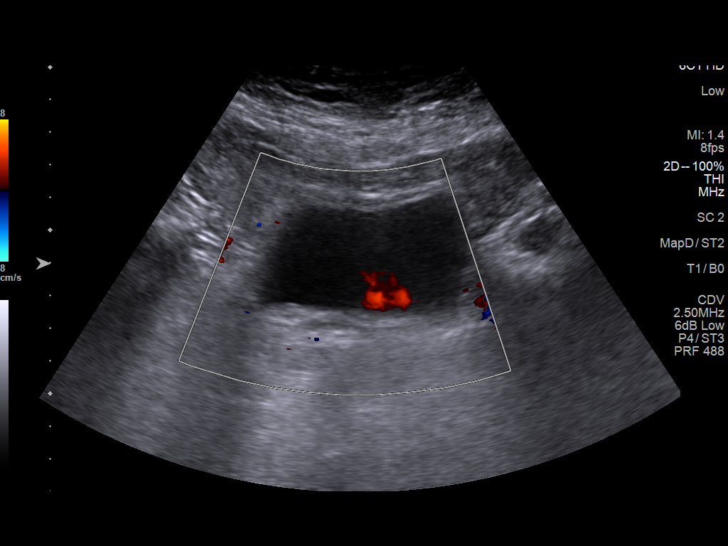

[14 of 25 positions shown; findings below may reference images not displayed]

FINDINGS: Right Kidney:

Renal measurements: 8.2 x 3.6 x 3.6 cm = volume: 56 mL. Cortical
thinning is noted. Echogenicity within normal limits. No mass or
hydronephrosis visualized.

Left Kidney:

Renal measurements: 11.0 x 4.9 x 5.3 cm = volume: 148 mL.
Echogenicity within normal limits. No mass or hydronephrosis
visualized.

Bladder:

Appears normal for degree of bladder distention. Bilateral ureteral
jets are visualized.

Other:

None.
IMPRESSION: Moderate right renal atrophy is noted. Normal left kidney. No
hydronephrosis or renal obstruction is noted.
